# Patient Record
Sex: Female | Born: 1940 | Race: White | Hispanic: No | Marital: Married | Smoking: Never smoker
Health system: Southern US, Community
[De-identification: ages and names within clinical notes are randomized; demographics above are authoritative.]

## PROBLEM LIST (undated history)

## (undated) DIAGNOSIS — I639 Cerebral infarction, unspecified: Secondary | ICD-10-CM

## (undated) DIAGNOSIS — T8859XA Other complications of anesthesia, initial encounter: Secondary | ICD-10-CM

## (undated) DIAGNOSIS — IMO0002 Reserved for concepts with insufficient information to code with codable children: Secondary | ICD-10-CM

## (undated) DIAGNOSIS — I4891 Unspecified atrial fibrillation: Secondary | ICD-10-CM

## (undated) DIAGNOSIS — K5790 Diverticulosis of intestine, part unspecified, without perforation or abscess without bleeding: Secondary | ICD-10-CM

## (undated) DIAGNOSIS — T4145XA Adverse effect of unspecified anesthetic, initial encounter: Secondary | ICD-10-CM

## (undated) DIAGNOSIS — I10 Essential (primary) hypertension: Secondary | ICD-10-CM

## (undated) DIAGNOSIS — K635 Polyp of colon: Secondary | ICD-10-CM

## (undated) HISTORY — PX: TUBAL LIGATION: SHX77

## (undated) HISTORY — PX: EYE SURGERY: SHX253

## (undated) HISTORY — DX: Diverticulosis of intestine, part unspecified, without perforation or abscess without bleeding: K57.90

## (undated) HISTORY — DX: Essential (primary) hypertension: I10

## (undated) HISTORY — DX: Cerebral infarction, unspecified: I63.9

## (undated) HISTORY — DX: Unspecified atrial fibrillation: I48.91

## (undated) HISTORY — DX: Polyp of colon: K63.5

## (undated) HISTORY — DX: Reserved for concepts with insufficient information to code with codable children: IMO0002

## (undated) HISTORY — PX: APPENDECTOMY: SHX54

---

## 1993-03-22 DIAGNOSIS — IMO0002 Reserved for concepts with insufficient information to code with codable children: Secondary | ICD-10-CM | POA: Insufficient documentation

## 1993-03-22 HISTORY — DX: Reserved for concepts with insufficient information to code with codable children: IMO0002

## 1999-06-15 ENCOUNTER — Encounter: Payer: Self-pay | Admitting: Internal Medicine

## 1999-06-15 ENCOUNTER — Ambulatory Visit (HOSPITAL_COMMUNITY): Admission: RE | Admit: 1999-06-15 | Discharge: 1999-06-15 | Payer: Self-pay | Admitting: Internal Medicine

## 1999-06-29 ENCOUNTER — Encounter: Payer: Self-pay | Admitting: Internal Medicine

## 1999-07-14 ENCOUNTER — Ambulatory Visit (HOSPITAL_COMMUNITY): Admission: RE | Admit: 1999-07-14 | Discharge: 1999-07-14 | Payer: Self-pay | Admitting: Internal Medicine

## 1999-07-14 ENCOUNTER — Encounter (INDEPENDENT_AMBULATORY_CARE_PROVIDER_SITE_OTHER): Payer: Self-pay | Admitting: Specialist

## 1999-07-14 ENCOUNTER — Encounter: Payer: Self-pay | Admitting: Internal Medicine

## 2004-01-03 ENCOUNTER — Encounter: Payer: Self-pay | Admitting: Internal Medicine

## 2005-01-07 ENCOUNTER — Ambulatory Visit: Payer: Self-pay | Admitting: Internal Medicine

## 2005-01-12 ENCOUNTER — Ambulatory Visit: Payer: Self-pay | Admitting: Internal Medicine

## 2005-10-26 ENCOUNTER — Observation Stay (HOSPITAL_COMMUNITY): Admission: EM | Admit: 2005-10-26 | Discharge: 2005-10-27 | Payer: Self-pay | Admitting: Emergency Medicine

## 2005-10-26 ENCOUNTER — Encounter: Payer: Self-pay | Admitting: Cardiology

## 2005-10-26 ENCOUNTER — Ambulatory Visit: Payer: Self-pay | Admitting: Internal Medicine

## 2005-11-12 ENCOUNTER — Ambulatory Visit: Payer: Self-pay | Admitting: Internal Medicine

## 2006-11-15 DIAGNOSIS — I10 Essential (primary) hypertension: Secondary | ICD-10-CM

## 2007-01-12 ENCOUNTER — Ambulatory Visit: Payer: Self-pay | Admitting: Internal Medicine

## 2007-01-12 DIAGNOSIS — Z860100 Personal history of colon polyps, unspecified: Secondary | ICD-10-CM | POA: Insufficient documentation

## 2007-01-12 DIAGNOSIS — K5730 Diverticulosis of large intestine without perforation or abscess without bleeding: Secondary | ICD-10-CM | POA: Insufficient documentation

## 2007-02-09 ENCOUNTER — Ambulatory Visit: Payer: Self-pay | Admitting: Internal Medicine

## 2007-02-10 LAB — CONVERTED CEMR LAB
BUN: 7 mg/dL (ref 6–23)
CO2: 31 meq/L (ref 19–32)
Calcium: 10 mg/dL (ref 8.4–10.5)
Chloride: 100 meq/L (ref 96–112)
Creatinine, Ser: 0.8 mg/dL (ref 0.4–1.2)
GFR calc Af Amer: 92 mL/min
GFR calc non Af Amer: 76 mL/min
Glucose, Bld: 83 mg/dL (ref 70–99)
Potassium: 4.6 meq/L (ref 3.5–5.1)
Sodium: 137 meq/L (ref 135–145)

## 2007-04-25 ENCOUNTER — Ambulatory Visit: Payer: Self-pay | Admitting: Internal Medicine

## 2007-04-25 DIAGNOSIS — C4499 Other specified malignant neoplasm of skin, unspecified: Secondary | ICD-10-CM | POA: Insufficient documentation

## 2007-08-10 ENCOUNTER — Ambulatory Visit: Payer: Self-pay | Admitting: Internal Medicine

## 2007-11-07 ENCOUNTER — Ambulatory Visit: Payer: Self-pay | Admitting: Internal Medicine

## 2007-11-28 ENCOUNTER — Ambulatory Visit (HOSPITAL_COMMUNITY): Admission: RE | Admit: 2007-11-28 | Discharge: 2007-11-29 | Payer: Self-pay | Admitting: Ophthalmology

## 2008-02-16 ENCOUNTER — Ambulatory Visit: Payer: Self-pay | Admitting: Family Medicine

## 2008-02-16 DIAGNOSIS — L408 Other psoriasis: Secondary | ICD-10-CM | POA: Insufficient documentation

## 2008-11-13 ENCOUNTER — Ambulatory Visit: Payer: Self-pay | Admitting: Internal Medicine

## 2008-11-13 LAB — CONVERTED CEMR LAB
ALT: 20 units/L (ref 0–35)
AST: 24 units/L (ref 0–37)
Albumin: 4.3 g/dL (ref 3.5–5.2)
Alkaline Phosphatase: 61 units/L (ref 39–117)
BUN: 6 mg/dL (ref 6–23)
Basophils Absolute: 0 10*3/uL (ref 0.0–0.1)
Basophils Relative: 0.3 % (ref 0.0–3.0)
Bilirubin Urine: NEGATIVE
Bilirubin, Direct: 0 mg/dL (ref 0.0–0.3)
CO2: 33 meq/L — ABNORMAL HIGH (ref 19–32)
Calcium: 9.8 mg/dL (ref 8.4–10.5)
Chloride: 98 meq/L (ref 96–112)
Cholesterol: 218 mg/dL — ABNORMAL HIGH (ref 0–200)
Creatinine, Ser: 0.7 mg/dL (ref 0.4–1.2)
Direct LDL: 143.5 mg/dL
Eosinophils Absolute: 0.1 10*3/uL (ref 0.0–0.7)
Eosinophils Relative: 1.3 % (ref 0.0–5.0)
GFR calc non Af Amer: 88.32 mL/min (ref >60)
Glucose, Bld: 102 mg/dL — ABNORMAL HIGH (ref 70–99)
Glucose, Urine, Semiquant: NEGATIVE
HCT: 45.2 % (ref 36.0–46.0)
HDL: 60.9 mg/dL (ref >39.00)
Hemoglobin: 14.8 g/dL (ref 12.0–15.0)
Ketones, urine, test strip: NEGATIVE
Lymphocytes Relative: 34.7 % (ref 12.0–46.0)
Lymphs Abs: 1.9 10*3/uL (ref 0.7–4.0)
MCHC: 32.7 g/dL (ref 30.0–36.0)
MCV: 88.6 fL (ref 78.0–100.0)
Monocytes Absolute: 0.6 10*3/uL (ref 0.1–1.0)
Monocytes Relative: 10 % (ref 3.0–12.0)
Neutro Abs: 2.9 10*3/uL (ref 1.4–7.7)
Neutrophils Relative %: 53.7 % (ref 43.0–77.0)
Nitrite: NEGATIVE
Platelets: 273 10*3/uL (ref 150.0–400.0)
Potassium: 4.3 meq/L (ref 3.5–5.1)
RBC: 5.1 M/uL (ref 3.87–5.11)
RDW: 12.6 % (ref 11.5–14.6)
Sodium: 135 meq/L (ref 135–145)
Specific Gravity, Urine: 1.015
TSH: 3.06 microintl units/mL (ref 0.35–5.50)
Total Bilirubin: 1 mg/dL (ref 0.3–1.2)
Total CHOL/HDL Ratio: 4
Total Protein: 8.2 g/dL (ref 6.0–8.3)
Triglycerides: 79 mg/dL (ref 0.0–149.0)
Urobilinogen, UA: 0.2
VLDL: 15.8 mg/dL (ref 0.0–40.0)
WBC Urine, dipstick: NEGATIVE
WBC: 5.5 10*3/uL (ref 4.5–10.5)
pH: 7.5

## 2008-11-21 ENCOUNTER — Ambulatory Visit: Payer: Self-pay | Admitting: Internal Medicine

## 2008-11-21 DIAGNOSIS — I872 Venous insufficiency (chronic) (peripheral): Secondary | ICD-10-CM | POA: Insufficient documentation

## 2008-12-12 ENCOUNTER — Encounter (INDEPENDENT_AMBULATORY_CARE_PROVIDER_SITE_OTHER): Payer: Self-pay | Admitting: *Deleted

## 2009-04-16 ENCOUNTER — Telehealth: Payer: Self-pay | Admitting: Internal Medicine

## 2009-08-12 ENCOUNTER — Telehealth (INDEPENDENT_AMBULATORY_CARE_PROVIDER_SITE_OTHER): Payer: Self-pay | Admitting: *Deleted

## 2009-12-16 ENCOUNTER — Ambulatory Visit: Payer: Self-pay | Admitting: Internal Medicine

## 2009-12-18 LAB — CONVERTED CEMR LAB
BUN: 10 mg/dL (ref 6–23)
Basophils Absolute: 0 10*3/uL (ref 0.0–0.1)
Basophils Relative: 0.4 % (ref 0.0–3.0)
CO2: 31 meq/L (ref 19–32)
Calcium: 9.3 mg/dL (ref 8.4–10.5)
Chloride: 98 meq/L (ref 96–112)
Creatinine, Ser: 0.7 mg/dL (ref 0.4–1.2)
Eosinophils Absolute: 0 10*3/uL (ref 0.0–0.7)
Eosinophils Relative: 0.8 % (ref 0.0–5.0)
GFR calc non Af Amer: 88.03 mL/min (ref >60)
Glucose, Bld: 86 mg/dL (ref 70–99)
HCT: 42.6 % (ref 36.0–46.0)
Hemoglobin: 14.2 g/dL (ref 12.0–15.0)
Lymphocytes Relative: 38.2 % (ref 12.0–46.0)
Lymphs Abs: 2 10*3/uL (ref 0.7–4.0)
MCHC: 33.4 g/dL (ref 30.0–36.0)
MCV: 88.3 fL (ref 78.0–100.0)
Monocytes Absolute: 0.5 10*3/uL (ref 0.1–1.0)
Monocytes Relative: 10.5 % (ref 3.0–12.0)
Neutro Abs: 2.6 10*3/uL (ref 1.4–7.7)
Neutrophils Relative %: 50.1 % (ref 43.0–77.0)
Platelets: 262 10*3/uL (ref 150.0–400.0)
Potassium: 3.9 meq/L (ref 3.5–5.1)
RBC: 4.83 M/uL (ref 3.87–5.11)
RDW: 13.4 % (ref 11.5–14.6)
Sodium: 136 meq/L (ref 135–145)
TSH: 1.82 microintl units/mL (ref 0.35–5.50)
WBC: 5.2 10*3/uL (ref 4.5–10.5)

## 2010-04-21 NOTE — Progress Notes (Signed)
Summary: Schedule recall colonoscopy  Phone Note Outgoing Call Call back at Home Phone [phone]   Call placed by: Christie Nottingham CMA Duncan Dull),  Aug 12, 2009 11:51 AM Call placed to: Patient Summary of Call: Called pt to schedule recall colonoscopy and pt states her husband lost his job last year and they are not scheduling any expensive tests at this time and she states she will be in touch to schedule this procedure when she gets the money. I told her the importance of scheduling and financial options. Pt states she will call back to schedule.  Initial call taken by: Christie Nottingham CMA Duncan Dull),  Aug 12, 2009 11:53 AM

## 2010-04-21 NOTE — Assessment & Plan Note (Signed)
Summary: fu on meds/njr   Vital Signs:  Patient profile:   70 year old female Menstrual status:  postmenopausal Weight:      137 pounds BMI:     22.19 Temp:     98.6 degrees F oral Pulse rate:   78 / minute Pulse rhythm:   regular Resp:     12 per minute BP sitting:   154 / 76  Vitals Entered By: Lynann Beaver CMA (December 16, 2009 10:15 AM) CC: rov Is Patient Diabetic? No Pain Assessment Patient in pain? no        CC:  rov.  History of Present Illness:  Follow-Up Visit      This is a 70 year old woman who presents for Follow-up visit.  The patient denies chest pain and palpitations.  Since the last visit the patient notes no new problems or concerns.  The patient reports taking meds as prescribed.  When questioned about possible medication side effects, the patient notes none.    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 Qd 2)  Benazepril-Hydrochlorothiazide 20-12.5 Mg Tabs (Benazepril-Hydrochlorothiazide) .... Take 1 Tablet By Mouth Two Times A Day--Needs Office Visit For Additional Refills  Allergies (verified): 1)  ! Sulfa 2)  ! Dilantin 3)  ! Aspirin (Aspirin)  Past History:  Past Medical History: Last updated: 08/10/2007 Atrial fibrillation Hypertension Cerebrovascular accident, hx of-hemorrhagic stroke. 1996 Colonic polyps, hx of Diverticulosis, colon  Past Surgical History: Last updated: 11/15/2006 Colonoscopy-01/03/2004 Appendectomy BTL  Family History: Last  updated: 11/15/2006 Father: MI, CABG,Skin CA Mother: Stroke,HTN Siblings: CAD, HTN ?  Social History: Last updated: 11/15/2006 Never Smoked Alcohol use-no Marital Status: Married Children: 7 Occupation:   Risk Factors: Smoking Status: never (11/15/2006)  Physical Exam  General:  alert and well-developed.   Head:  normocephalic and atraumatic.   Eyes:  pupils equal and pupils round.   Ears:  R ear normal and L ear normal.   Neck:  No deformities, masses, or tenderness noted. Lungs:  normal respiratory effort and no intercostal retractions.   Heart:  normal rate and regular rhythm.   Abdomen:  soft and non-tender.   Neurologic:  cranial nerves II-XII intact and gait normal.   Skin:  turgor normal and color normal.     Impression & Recommendations:  Problem # 1:  HYPERTENSION (ICD-401.9)  she admit to taking only 1 pill  by mouth once daily advised to increase dose---she understands Her updated medication list for this problem includes:    Lisinopril-hydrochlorothiazide 20-25 Mg Tabs (Lisinopril-hydrochlorothiazide) .Marland Kitchen... Take 1 tab by mouth daily  BP today: 154/76 Prior BP: 142/78 (11/21/2008)  Labs Reviewed: K+: 4.3 (11/13/2008) Creat: : 0.7 (11/13/2008)   Chol: 218 (11/13/2008)   HDL: 60.90 (11/13/2008)   TG: 79.0 (11/13/2008)  Orders: Venipuncture (47829) Specimen Handling (56213) TLB-BMP (Basic Metabolic Panel-BMET) (80048-METABOL)  Problem # 2:  ATRIAL FIBRILLATION-TRANSIENT (ICD-427.31)  no recurrence  Orders: Specimen Handling (08657) TLB-TSH (Thyroid Stimulating Hormone) (84443-TSH) TLB-CBC Platelet - w/Differential (85025-CBCD)  Problem # 3:  STROKE (ICD-434.91) no recurrence  Complete Medication List: 1)  Multivitamins Tabs (Multiple vitamin) .... Qd 2)  Lisinopril-hydrochlorothiazide 20-25 Mg Tabs (Lisinopril-hydrochlorothiazide) .... Take 1 tab by mouth daily Prescriptions: LISINOPRIL-HYDROCHLOROTHIAZIDE 20-25 MG  TABS  (LISINOPRIL-HYDROCHLOROTHIAZIDE) Take 1 tab by mouth daily  #90 x 3   Entered and Authorized by:   Birdie Sons MD   Signed by:   Birdie Sons MD on 12/16/2009   Method used:   Electronically to        [facility]  [phone]* (retail)       [address]       [place], [place]  [address]       Ph: [phone] or [phone]       Fax: [phone]   RxID:   [id number]

## 2010-04-21 NOTE — Progress Notes (Signed)
Summary: discuss meds  Phone Note Call from Patient Call back at Home Phone [phone]   Caller: Patient, vm Call For: Birdie Sons MD Summary of Call: husband lost job at Christmas and now no insurance.  Want to discuss meds for cheapest. Initial call taken by: Gladis Riffle, RN,  April 16, 2009 3:16 PM  Follow-up for Phone Call        Pt has 2 week supply left of benazepril-hct 20-12.5 two times a day .  [facility] has benazepril 20mg  #90 for $10 so  two times a day would be $20.---and then hctz 12.5 mg at [facility] is #90 for $10 so two times a day would be $20  other alternative would be benazepril 40mg  at #90 for $10 and do 1/2 two times a day plus hctz 25mg  for #90 at $10 and do 1/2 two times a day.  Advise.                      [facility] Follow-up by: Gladis Riffle, RN,  April 16, 2009 3:38 PM  Additional Follow-up for Phone Call Additional follow up Details #1::        as long as the dose is the same any combination is ok Additional Follow-up by: Birdie Sons MD,  April 16, 2009 7:15 PM    Additional Follow-up for Phone Call Additional follow up Details #2::    Rx will be written for pt to take where she wants.  She can call pharmacies to get cheapest.Left message on machine to pick up Rx.  see Rx Follow-up by: Gladis Riffle, RN,  April 18, 2009 2:19 PM  New/Updated Medications: BENAZEPRIL-HYDROCHLOROTHIAZIDE 20-12.5 MG TABS (BENAZEPRIL-HYDROCHLOROTHIAZIDE) Take 1 tablet by mouth two times a day Prescriptions: BENAZEPRIL-HYDROCHLOROTHIAZIDE 20-12.5 MG TABS (BENAZEPRIL-HYDROCHLOROTHIAZIDE) Take 1 tablet by mouth two times a day  #30 x 3   Entered by:   Gladis Riffle, RN   Authorized by:   Birdie Sons MD   Signed by:   Gladis Riffle, RN on 04/18/2009   Method used:   Print then Give to Patient   RxID:   [id number]

## 2010-08-04 NOTE — Op Note (Signed)
[name], Kayla Wilson                ACCOUNT NO.:  [id number]   MEDICAL RECORD NO.:  [id number]          PATIENT TYPE:  OIB   LOCATION:  5118                         FACILITY:  [facility]   PHYSICIAN:  Beulah Gandy. Ashley Royalty, M.D. DATE OF BIRTH:  09/22/1940   DATE OF PROCEDURE:  11/28/2007  DATE OF DISCHARGE:                               OPERATIVE REPORT   ADMISSION DIAGNOSIS:  Macular hole, left eye.   PROCEDURES:  1. Pars plana vitrectomy, left eye.  2. Retinal photocoagulation, left eye.  3. Membrane peel, left eye.  4. Serum patch, left eye.  5. Gas-fluid exchange, left eye.  6. Perfluoropropane injection, left eye.   SURGEON:  Beulah Gandy. Ashley Royalty, MD   ASSISTANT:  Rosalie Doctor, MA   ANESTHESIA:  General.   DETAILS:  Usual prep and drape.  Peritomies at 10, 2, and 4 o'clock with  infusion at 4 o'clock.  Contact lens ring anchored into place at 6 and  12 o'clock.  Provisc placed on the corneal surface, and the flat contact  lens was placed.  Pars plana vitrectomy was begun just behind the  cataractous lens.  Strands and membranes were encountered.  The  vitrectomy was carried down to the macular surface where the internal  limiting membrane was taut and glistening.  This preretinal fibrosis was  seen on top of the membrane.  The membrane was peeled with the lighted  pick, the MVR blade, and the diamond dusted membrane scraper.  It was  peeled out to the equator, and the cutter was used to remove the ILM  remnants.  The edge of the hole was treated.  At this point, the  indirect ophthalmoscope laser was moved into place, 749 burns were  placed around the periphery in 2 rows, the power was 400 mV, 1000  microns each, and 0.1 seconds each.  A gas-fluid exchange was performed  after a total peripheral vitrectomy down to the vitreous base to the  visualization of the 30-degree prismatic lens.  The total gas fluid  exchange was performed, and sufficient time was allowed for additional  fluid  to track down the walls of the eye and collect in the posterior  segment.  A serum patch was prepared during this time, and the  Perfluoropropane was mixed to a 16% concentration.  The additional fluid  was removed with [place] ophthalmics brush.  Serum patch was delivered.  Intravitreal gas was exchanged for Perfluoropropane 16%.  The  instruments were removed from the eye, and a 9-0 nylon was used to close  the sclerotomy sites.  They were tested and found to be tight.  The  conjunctiva was closed with wet-field cautery.  Polymyxin and gentamicin  were irrigated into [place] space.  Atropine solution was applied.  Marcaine was then injected around the globe for postop pain.  Decadron  10 mg was injected for lower  subconjunctival space.  Additional gas fill raised the pressure to 10  with a [place] tonometer.  Polysporin ophthalmic ointment and patch  and shield were placed.  The patient was awakened and taken to recovery  in satisfactory condition.      Beulah Gandy. Ashley Royalty, M.D.  Electronically Signed     JDM/MEDQ  D:  11/28/2007  T:  11/29/2007  Job:  [id number]

## 2010-08-07 NOTE — H&P (Signed)
Kayla Wilson, Kayla Wilson                ACCOUNT NO.:  [id number]   MEDICAL RECORD NO.:  [id number]          PATIENT TYPE:  EMS   LOCATION:  MAJO                         FACILITY:  [facility]   PHYSICIAN:  Joellyn Rued, P.A. [facility] DATE OF BIRTH:  05-21-1940   DATE OF ADMISSION:  10/26/2005  DATE OF DISCHARGE:                                HISTORY & PHYSICAL   BRIEF HISTORY:  Ms. Hieronymus a is a 70 year old white female who presented to  [facility] emergency room with a syncopal episode.  She stated this past  Friday she first noticed a sore throat followed by chills sinus congestion  and drainage.  Saturday she became very nauseated with a decreased appetite.  This persisted throughout the weekend and on Monday she felt slightly better  and took four Coricidin.  She never checked her temperature throughout the  week. This morning she was unable to urinate and she felt weak.  While  fixing breakfast for her husband and her son, she felt very lightheaded and  dizzy and felt that she needed to sit on the floor; however, her husband  witnessed loss of consciousness of less than 1 minute.  She did not sustain  any injuries when she fell to the floor.  Post her episode she stated that  she was weak in her husband drove her the emergency room for further  evaluation.  She taken one Tylenol this morning.  She denies prior  occurrences.  She denies any over-the-counter medications such as Sudafed,  excessive chocolate or caffeine.   ALLERGIES:  Include SULFA and DILANTIN.   MEDICATIONS:  Include Lotensin, HCT 20/12.5 q.d. She has not had this today.   Her past medical history is notable for hypertension, a CVA approximately 10  years ago.  She stated that she had a seizure at presentation secondary to a  burst blood vessel.  Specifics unavailable she was told never to take any  blood thinners including aspirin.   Surgical history is notable for appendectomy at age 58, BTL.  She denies any  history of  diabetes, myocardial infarction, COPD, bleeding dyscrasias,  thyroid dysfunction.  She does not know her specific cholesterol.  She  resides in [place] with her husband and her son.  She has total seven  children, 11 grandchildren and two great-grandchildren.  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  She  states she has never smoked, used alcohol, drugs or herbal medication.  She  maintains a low salt diet.  She does not exercise on a regular basis but is  very active.   FAMILY HISTORY:  Her mother died at the age of 52 secondary to CVA, unknown  history of heart problems.  Her father died age 4 secondary to a broken  heart he also had some type of blood tumors and heart problems.  She has  three brothers all have diabetes.  One sister who is alive and well.   REVIEW OF SYSTEMS:  In addition to above is notable for chills, sweats,  headache since Saturday,  sinus drainage, glasses upper and lower dentures.  Multiple moles.  Green productive cough.  Palpitations since last night.  Postmenopausal.  Straining for urination this morning.  Generalized  weakness.  Arthralgias, myalgias all over throughout the weekend and nausea.  All other systems are negative.   PHYSICAL EXAM:  GENERAL:  Well-nourished, well-developed pleasant white  female in no apparent distress on presentation to the emergency room. VITAL  SIGNS:  Temperature was 99.  Initial blood pressure by me was 82/57 and is  now 107/59 after increase of IV fluids.  Pulse was 156 but ranges from 100  to 150s.  Respirations are 18, 97% sat on room air.  HEENT: Is unremarkable except for dentures.  NECK: Supple without thyromegaly, adenopathy, JVD or carotid bruits.  JVP is  approximately 7 __________  she does have right orbital sinus tenderness.  LUNGS:  Symmetrical excursion and distant breath sounds.  HEART:  PMI is not displaced.  Rapid irregular irregular rate and rhythm.  Do  not appreciate any murmurs, rubs, clicks or gallops.  SKIN:  Integument  appears to be intact.  ABDOMEN: Soft.  Bowel sounds present without organomegaly, masses or  tenderness.  EXTREMITIES: No cyanosis, clubbing or edema.  MUSCULOSKELETAL/NEURO:  Grossly unremarkable.   Chest x-ray showed no active disease, COPD findings, tortuous aorta.  EKG  shows atrial fibrillation with a rapid ventricular rate of 117, nonspecific  ST-T wave changes.  No old EKGs for comparison.  Labs are pending.   DISPOSITION:  Upper respiratory/sinus infection, probable dehydration  resulting in hypotension and syncope, atrial fibrillation with a rapid  ventricular rate. More history per past medical history.   DISPOSITION:  Dr. Kittie Plater spoke with the patient and her family, examined  the patient and determined plan of care.  We will admit her for further but  evaluation.  Will begin her on nasal oxygen, nasal steroids for her sinus  infection, IV fluids for her dehydration hypotension. We will continue her  IV Cardizem and add digoxin for her atrial fibrillation.  It is noted that  the patient refuses aspirin, Coumadin and any anticoagulation at this time  given her history of CVA and burst blood vessel.  We will also obtain 2-D  echocardiogram, TSH and our usual labs.  Hopefully as her dehydration and  sinus infection improve, she will convert to sinus rhythm and will not need  long-term anticoagulation.      Joellyn Rued, P.A. [facility]     EW/MEDQ  D:  10/26/2005  T:  10/26/2005  Job:  [id number]   cc:   Valetta Mole. Swords, M.D. [facility]  Jomarie Longs D. Venetia Maxon, M.D.

## 2010-08-07 NOTE — Discharge Summary (Signed)
NAMECHEVI, LIM                ACCOUNT NO.:  [id number]   MEDICAL RECORD NO.:  [id number]          PATIENT TYPE:  INP   LOCATION:  4742                         FACILITY:  [facility]   PHYSICIAN:  Flora Bing, M.D. LHCDATE OF BIRTH:  June 01, 1940   DATE OF ADMISSION:  10/26/2005  DATE OF DISCHARGE:  10/27/2005                           DISCHARGE SUMMARY - REFERRING   PROCEDURES PERFORMED:  None.   DISCHARGE DIAGNOSES:  1.  Syncope.  2.  Atrial fibrillation with rapid ventricular rate, less than 24 hours and      conversion to normal sinus rhythm, probably associated with upper      respiratory infection.  3.  Upper respiratory infection/sinus infection, urinary tract infection.      History is noted below.   BRIEF HISTORY:  Ms. Khurana is a 70 year old white female who presented to [facility] Emergency Room with a syncopal episode.  This was preceded with a four  or five-day history of chills, sinus congestion with drainage, nausea,  decreased appetite, decreased urinary output, and weakness.  On the morning  of admission, she felt very light-headed and dizzy and was unable to  urinate.  According to the patient, her husband had stated that she lost  consciousness for approximately 1 minute without injuries.  On arrival to  the emergency room, she was in atrial fibrillation with a rapid ventricular  rate and febrile.  Her history is notable for hypertension, a CVA  approximately 10 years ago associated with a seizure secondary to a burst  blood vessel.  Specifics are unavailable; however, the patient refuses to  take any anticoagulation medications, including aspirin.   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  T-max  during admission was 102.8.  Admission weight was 141.5.  O2 sats were  between 95% and 99% on room air.  H&H was 15.6 and 46, normal indices.  Plateltets 267.  WBC  13.1.  PTT 28.  PT 15.7.  Sodium 131, potassium 3.5,  BUN 4, creatinine 0.7, normal LFTs.  Hemoglobin A1c 5.6.  CK-MBs and  troponins were within normal limits x 3.  Total cholesterol 133.  Triglycerides 42.  HDL 41, LDL 84.  TSH 2.933.  Free T4 0.89.  EKG on  admission showed atrial fibrillation with a rapid ventricular rate, no acute  changes.  On the 8th, it showed normal sinus rhythm, normal axis, early R-  wave, nonspecific ST-T wave changes.  Echocardiogram obtained on the 7th,  showed an EF of 55% to 60%, inadequate for wall motion abnormalities.  There  was grade 1 diastolic dysfunction, mild aortic valve calcification, and mild  AI, mild MR.   HOSPITAL COURSE:  Ms. Pilkenton was initially placed on IV Cardizem and digoxin  for rate control.  However, after initially beginning these medications, she  converted to normal sinus rhythm, sinus bradycardia.  The Cardizem and  digoxin were discontinued.  She was placed on IV antibiotics and IV fluids  and admitted to the hospital for further treatment of her  infection.  Overnight, she remained in sinus rhythm.  After receiving fluid and being  placed on antibiotics, Ms. Blackwell, on the morning of October 27, 2005, stated  that she felt much, much better.  Urinalysis was also abnormal in regard to  bacteria, leukocytes, ketones, protein and blood.  [place], she denied  dysuria.  Urine culture was obtained and is pending at the time of this  dictation.  After review by Dr. Dietrich Pates, it was felt that the patient could  be discharged home with further outpatient treatment and followup with her  primary care physician.   DISPOSITION:  Ms. Formisano is discharged home.  She is asked to maintain low-  salt, low, and cholesterol diet.  She is asked to rest and avoid heavy  exertion for approximately one week.  Wound care is not applicable.  She was  asked to begin a blood pressure diary and to bring all medications and her  blood pressure diary to follow-up  appointments with Dr. Cato Mulligan.  She was  asked to call him for a follow-up appointment.  She received a new  prescription for Levaquin 500 mg daily for 10 days, Nasonex 500 mcg two  sprays each naris daily, and Toprol XL 25 mg p.o. daily.  She was asked not  to take her Lotensin/HCT.   Ms. Weich, at the time of discharge, states that she will think about not  taking her Lotensin/HCT versus beginning a new medication for her blood  pressure.  She states that she will consider this.  She is very reluctant to  adjust any of her medications.      Joellyn Rued, P.A. [facility]      [place] Bing, M.D. [facility]  Electronically Signed    EW/MEDQ  D:  10/27/2005  T:  10/27/2005  Job:  [id number]   cc:   Valetta Mole. Swords, M.D. [facility]

## 2010-11-25 ENCOUNTER — Other Ambulatory Visit: Payer: Self-pay | Admitting: Internal Medicine

## 2010-12-23 LAB — BASIC METABOLIC PANEL
CO2: 30
Chloride: 91 — ABNORMAL LOW
Creatinine, Ser: 0.66
GFR calc Af Amer: >60
Glucose, Bld: 105 — ABNORMAL HIGH

## 2010-12-23 LAB — CBC
Hemoglobin: 14.8
MCHC: 33.8
MCV: 87
RBC: 5.04
RDW: 12.6

## 2011-06-14 ENCOUNTER — Other Ambulatory Visit: Payer: Self-pay | Admitting: Internal Medicine

## 2011-06-16 ENCOUNTER — Other Ambulatory Visit: Payer: Self-pay | Admitting: Internal Medicine

## 2011-09-28 ENCOUNTER — Other Ambulatory Visit: Payer: Self-pay | Admitting: Internal Medicine

## 2011-10-04 ENCOUNTER — Other Ambulatory Visit: Payer: Self-pay | Admitting: Internal Medicine

## 2011-10-12 ENCOUNTER — Telehealth: Payer: Self-pay | Admitting: Internal Medicine

## 2011-10-12 DIAGNOSIS — I10 Essential (primary) hypertension: Secondary | ICD-10-CM

## 2011-10-12 MED ORDER — LISINOPRIL-HYDROCHLOROTHIAZIDE 20-25 MG PO TABS: 1.0000 | ORAL_TABLET | Freq: Every day | ORAL | Status: DC

## 2011-10-12 NOTE — Telephone Encounter (Signed)
rx sent in electronically 

## 2011-10-12 NOTE — Telephone Encounter (Signed)
Pt is coming in for ov on 11/01/11 at 9am and is going to need refill of lisinopril-hydrochlorothiazide (PRINZIDE,ZESTORETIC) 20-25 MG per tablet to last until appt date. Pls call in to [facility] in [place]. Pt said that Dr Cato Mulligan has approved refill.

## 2011-11-01 ENCOUNTER — Encounter: Payer: Self-pay | Admitting: Internal Medicine

## 2011-11-01 ENCOUNTER — Ambulatory Visit (INDEPENDENT_AMBULATORY_CARE_PROVIDER_SITE_OTHER): Payer: Medicare Other | Admitting: Internal Medicine

## 2011-11-01 VITALS — BP 128/76 | HR 68 | Temp 98.0°F | Ht 68.0 in | Wt 137.0 lb

## 2011-11-01 DIAGNOSIS — I10 Essential (primary) hypertension: Secondary | ICD-10-CM

## 2011-11-01 DIAGNOSIS — IMO0002 Reserved for concepts with insufficient information to code with codable children: Secondary | ICD-10-CM

## 2011-11-01 LAB — BASIC METABOLIC PANEL
BUN: 9 mg/dL (ref 6–23)
CO2: 32 mEq/L (ref 19–32)
Chloride: 92 mEq/L — ABNORMAL LOW (ref 96–112)
Creatinine, Ser: 0.8 mg/dL (ref 0.4–1.2)
Glucose, Bld: 98 mg/dL (ref 70–99)

## 2011-11-01 LAB — LIPID PANEL
Cholesterol: 197 mg/dL (ref 0–200)
LDL Cholesterol: 117 mg/dL — ABNORMAL HIGH (ref 0–99)
Total CHOL/HDL Ratio: 3

## 2011-11-01 MED ORDER — LISINOPRIL-HYDROCHLOROTHIAZIDE 20-25 MG PO TABS: 1.0000 | ORAL_TABLET | Freq: Every day | ORAL | Status: DC

## 2011-11-01 NOTE — Progress Notes (Signed)
Patient ID: Kayla Wilson, female   DOB: May 03, 1940, 71 y.o.   MRN: [id number] htn-- tolerating meds. Home BPs 130s/85  Hx of stroke- no recurrence   Past Medical History  Diagnosis Date  . Atrial fibrillation   . Hypertension   . Stroke   . Colon polyps   . Diverticulosis   . IATROGENIC CEREBROVASCULAR INFARCT/HEMORRHAGE NE 03/22/1993    Qualifier: Diagnosis of  By: Cato Mulligan MD, Maalik Pinn  1996 per pt's report-Dr Venetia Maxon- hemorrhagic stroke. No intervention required      History   Social History  . Marital Status: Married    Spouse Name: N/A    Number of Children: N/A  . Years of Education: N/A   Occupational History  . Not on file.   Social History Main Topics  . Smoking status: Never Smoker   . Smokeless tobacco: Not on file  . Alcohol Use: No  . Drug Use: No  . Sexually Active: Not on file   Other Topics Concern  . Not on file   Social History Narrative  . No narrative on file    Past Surgical History  Procedure Date  . Appendectomy   . Tubal ligation     Family History  Problem Relation Age of Onset  . Stroke Mother   . Hypertension Mother   . Heart disease Father     CABG, MI  . Cancer Father     skin  . Diabetes Brother   . Heart disease Brother   . Hypertension Brother     Allergies  Allergen Reactions  . Aspirin     REACTION: hx of hemorrhagic stroke  . Phenytoin   . Sulfonamide Derivatives     Current Outpatient Prescriptions on File Prior to Visit  Medication Sig Dispense Refill  . diphenhydrAMINE (BENADRYL) 12.5 MG/5ML liquid Take 12.5 mg by mouth 4 (four) times daily as needed.      Marland Kitchen lisinopril-hydrochlorothiazide (PRINZIDE,ZESTORETIC) 20-25 MG per tablet Take 1 tablet by mouth daily.  90 tablet  3     patient denies chest pain, shortness of breath, orthopnea. Denies lower extremity edema, abdominal pain, change in appetite, change in bowel movements. Patient denies rashes, musculoskeletal complaints. No other specific complaints in a  complete review of systems.   BP 128/76  Pulse 68  Temp 98 F (36.7 C) (Oral)  Ht 5\' 8"  (1.727 m)  Wt 137 lb (62.143 kg)  BMI 20.83 kg/m2  Well-developed well-nourished female in no acute distress. HEENT exam atraumatic, normocephalic, extraocular muscles are intact. Neck is supple. No jugular venous distention no thyromegaly. Chest clear to auscultation without increased work of breathing. Cardiac exam S1 and S2 are regular. Abdominal exam active bowel sounds, soft, nontender. Extremities no edema. Neurologic exam she is alert without any motor sensory deficits. Gait is normal.

## 2011-11-01 NOTE — Assessment & Plan Note (Signed)
Home BPs are adequately controlled Continue same meds 

## 2011-11-01 NOTE — Assessment & Plan Note (Signed)
No recurrence. 

## 2012-04-21 ENCOUNTER — Ambulatory Visit (INDEPENDENT_AMBULATORY_CARE_PROVIDER_SITE_OTHER): Payer: Medicare Other | Admitting: Family Medicine

## 2012-04-21 ENCOUNTER — Telehealth: Payer: Self-pay | Admitting: Internal Medicine

## 2012-04-21 ENCOUNTER — Encounter: Payer: Self-pay | Admitting: Family Medicine

## 2012-04-21 VITALS — BP 144/110 | HR 125 | Temp 98.3°F | Wt 134.0 lb

## 2012-04-21 DIAGNOSIS — R3915 Urgency of urination: Secondary | ICD-10-CM

## 2012-04-21 DIAGNOSIS — I10 Essential (primary) hypertension: Secondary | ICD-10-CM

## 2012-04-21 DIAGNOSIS — N390 Urinary tract infection, site not specified: Secondary | ICD-10-CM

## 2012-04-21 LAB — POCT URINALYSIS DIPSTICK
Glucose, UA: NEGATIVE
Nitrite, UA: NEGATIVE
Urobilinogen, UA: 0.2

## 2012-04-21 MED ORDER — CIPROFLOXACIN HCL 250 MG PO TABS: 250.0000 mg | ORAL_TABLET | Freq: Two times a day (BID) | ORAL | Status: DC

## 2012-04-21 NOTE — Telephone Encounter (Signed)
Pt is already here.

## 2012-04-21 NOTE — Progress Notes (Signed)
Chief Complaint  Patient presents with  . Urinary Urgency    and loss of control; burning     HPI:  Acute visit for ? UTI: -started: 1 week ago off and on - cranberry juice and azo helps - got better, but worse on Tuesday - didn't drink much today - no reason why -symptoms: urinary urgency, frequency, burning with urination and saw some blood in urine a few days ago -denies: fevers, chills, abd or pelic or flank pain, NV -hx of: UTI in the remote past -allergic to sulfu abx  HTN: -up today initially -takes her BP daily -reports always up when comes to see doctor - but checks at home and fine -denies: CP, SOB, palpitations, HA  ROS: See pertinent positives and negatives per HPI.  Past Medical History  Diagnosis Date  . Atrial fibrillation   . Hypertension   . Stroke   . Colon polyps   . Diverticulosis   . 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 No intervention required      Family History  Problem Relation Age of Onset  . Stroke Mother   . Hypertension Mother   . Heart disease Father     CABG, MI  . Cancer Father     skin  . Diabetes Brother   . Heart disease Brother   . Hypertension Brother     History   Social History  . Marital Status: Married    Spouse Name: N/A    Number of Children: N/A  . Years of Education: N/A   Social History Main Topics  . Smoking status: Never Smoker   . Smokeless tobacco: None  . Alcohol Use: No  . Drug Use: No  . Sexually Active: None   Other Topics Concern  . None   Social History Narrative  . None    Current outpatient prescriptions:diphenhydrAMINE (BENADRYL) 12.5 MG/5ML liquid, Take 12.5 mg by mouth 4 (four) times daily as needed., Disp: , Rfl: ;  lisinopril-hydrochlorothiazide (PRINZIDE,ZESTORETIC) 20-25 MG per tablet, Take 1 tablet by mouth daily., Disp: 90 tablet, Rfl: 3;  ciprofloxacin (CIPRO)  250 MG tablet, Take 1 tablet (250 mg total) by mouth 2 (two) times daily., Disp: 10 tablet, Rfl: 0  EXAM:  Filed Vitals:   04/21/12 1405  BP: 144/110  Pulse: 125  Temp: 98.3 F (36.8 C)    There is no height on file to calculate BMI.  GENERAL: vitals reviewed and listed above, alert, oriented, appears well hydrated and in no acute distress  HEENT: atraumatic, conjunttiva clear, no obvious abnormalities on inspection of external nose and ears  NECK: no obvious masses on inspection  LUNGS: clear to auscultation bilaterally, no wheezes, rales or rhonchi, good air movement  CV: HRRR, no peripheral edema  ABD: soft, NTTP, no CVA TTP  MS: moves all extremities without noticeable abnormality  PSYCH: pleasant and cooperative, no obvious depression or anxiety  ASSESSMENT AND PLAN:  Discussed the following assessment and plan:  1. Urinary urgency  POCT urinalysis dipstick  2. HYPERTENSION    3. UTI (lower urinary tract infection)  ciprofloxacin (CIPRO) 250 MG tablet   -urine dip and hx c/w UTI - will tx with abx/risks discussed. Culture pending - if cx neg will need follow up to repeat UA and ensure no blood. -BP 130/82 on recheck -advised to follow up with PCP for routine follow up or sooner  if any concerns -Patient advised to return or notify a doctor immediately if symptoms worsen or persist or new concerns arise.  Patient Instructions  --As we discussed, we have prescribed a new medication (CIPROFLOXACIN) for you at this appointment. We discussed the common and serious potential adverse effects of this medication and you can review these and more with the pharmacist when you pick up your medication.  Please follow the instructions for use carefully and notify us immediately if you have any problems taking this medication.  -drink lots of fluids - cranberry and blueberry juice can help  -follow up if worsening or any symptoms return or persist after treatment or any  concerns      KIM, HANNAH R.

## 2012-04-21 NOTE — Patient Instructions (Addendum)
--  As we discussed, we have prescribed a new medication (CIPROFLOXACIN) for you at this appointment. We discussed the common and serious potential adverse effects of this medication and you can review these and more with the pharmacist when you pick up your medication.  Please follow the instructions for use carefully and notify us immediately if you have any problems taking this medication.  -drink lots of fluids - cranberry and blueberry juice can help  -follow up if worsening or any symptoms return or persist after treatment or any concerns

## 2012-04-21 NOTE — Telephone Encounter (Signed)
Pt thinks she may have yeast onf - or bladder? Burns w/urination. Area starting to burn/itch. She wanted to know if Dr. Cato Mulligan could call her in something. As he is not here, I made her an appt at 2pm w/Dr Selena Batten. However, she'd like you to call her to discuss symptoms. She's wondering if something OTC would be ok. I advised that after talking w/you, she could cancel the appt if need be.

## 2012-04-23 LAB — URINE CULTURE: Colony Count: 8000

## 2012-04-24 ENCOUNTER — Telehealth: Payer: Self-pay | Admitting: Family Medicine

## 2012-04-24 NOTE — Telephone Encounter (Signed)
Culture did not show and infection. She needs to follow up with her doctor in a month or so and recheck urine to ensure blood gone and may need to see a urologist if has persistent symptoms or blood.

## 2012-04-25 NOTE — Telephone Encounter (Signed)
Left message on voicemail.

## 2012-04-26 NOTE — Telephone Encounter (Signed)
Called and spoke with pt and pt is aware.  Pt states the symptoms are gone and she feels much better.  Advised pt to give office a call back if symptoms worsen or come back.

## 2013-01-16 ENCOUNTER — Other Ambulatory Visit: Payer: Self-pay | Admitting: Internal Medicine

## 2013-06-20 ENCOUNTER — Other Ambulatory Visit: Payer: Self-pay | Admitting: Internal Medicine

## 2013-06-23 ENCOUNTER — Other Ambulatory Visit: Payer: Self-pay | Admitting: Internal Medicine

## 2013-06-26 ENCOUNTER — Other Ambulatory Visit: Payer: Self-pay | Admitting: Internal Medicine

## 2013-06-28 ENCOUNTER — Other Ambulatory Visit: Payer: Self-pay | Admitting: Internal Medicine

## 2013-07-02 ENCOUNTER — Telehealth: Payer: Self-pay | Admitting: Internal Medicine

## 2013-07-02 MED ORDER — LISINOPRIL-HYDROCHLOROTHIAZIDE 20-25 MG PO TABS: ORAL_TABLET | ORAL | Status: DC

## 2013-07-02 NOTE — Telephone Encounter (Signed)
Pt is needing to know if she can get a partial refill until her appointment on Friday with Padonda, pt states she has been out of meds for 1 week, needs rx lisinopril-hydrochlorothiazide (PRINZIDE,ZESTORETIC) 20-25 MG per tablet send to [facility] .

## 2013-07-02 NOTE — Telephone Encounter (Signed)
rx sent in electroncially 

## 2013-07-06 ENCOUNTER — Ambulatory Visit (INDEPENDENT_AMBULATORY_CARE_PROVIDER_SITE_OTHER): Payer: Medicare Other | Admitting: Family

## 2013-07-06 ENCOUNTER — Encounter: Payer: Self-pay | Admitting: Family

## 2013-07-06 VITALS — HR 111 | Temp 98.2°F | Ht 68.0 in | Wt 134.0 lb

## 2013-07-06 DIAGNOSIS — I10 Essential (primary) hypertension: Secondary | ICD-10-CM

## 2013-07-06 LAB — COMPREHENSIVE METABOLIC PANEL
ALBUMIN: 3.9 g/dL (ref 3.5–5.2)
ALK PHOS: 62 U/L (ref 39–117)
ALT: 18 U/L (ref 0–35)
AST: 25 U/L (ref 0–37)
BUN: 9 mg/dL (ref 6–23)
CALCIUM: 9.7 mg/dL (ref 8.4–10.5)
CHLORIDE: 93 meq/L — AB (ref 96–112)
CO2: 30 meq/L (ref 19–32)
Creatinine, Ser: 0.7 mg/dL (ref 0.4–1.2)
GFR: 88.61 mL/min (ref >60.00)
GLUCOSE: 94 mg/dL (ref 70–99)
POTASSIUM: 3.5 meq/L (ref 3.5–5.1)
SODIUM: 132 meq/L — AB (ref 135–145)
TOTAL PROTEIN: 8 g/dL (ref 6.0–8.3)
Total Bilirubin: 0.7 mg/dL (ref 0.3–1.2)

## 2013-07-06 NOTE — Progress Notes (Signed)
Subjective:    Patient ID: Kayla Wilson, female    DOB: 11-27-1940, 73 y.o.   MRN: [id number]  HPI 73 year old caucasian female, nonsmoker presenting for recheck of hypertension. She has been out of her medication for two and half weeks.Tries to follow a healthy diet. Does exercise.    Review of Systems  Constitutional: Negative.   Eyes: Negative for photophobia and visual disturbance.  Respiratory: Negative.   Cardiovascular: Negative.  Negative for leg swelling.  Gastrointestinal: Negative.   Genitourinary: Negative.   Musculoskeletal: Negative.   Skin: Negative.   Neurological: Negative.  Negative for dizziness, light-headedness and headaches.   Past Medical History  Diagnosis Date  . Atrial fibrillation   . Hypertension   . Stroke   . Colon polyps   . Diverticulosis   . 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 No intervention required      History   Social History  . Marital Status: Married    Spouse Name: N/A    Number of Children: N/A  . Years of Education: N/A   Occupational History  . Not on file.   Social History Main Topics  . Smoking status: Never Smoker   . Smokeless tobacco: Not on file  . Alcohol Use: No  . Drug Use: No  . Sexual Activity: Not on file   Other Topics Concern  . Not on file   Social History Narrative  . No narrative on file    Past Surgical History  Procedure Laterality Date  . Appendectomy    . Tubal ligation      Family History  Problem Relation Age of Onset  . Stroke Mother   . Hypertension Mother   . Heart disease Father     CABG, MI  . Cancer Father     skin  . Diabetes Brother   . Heart disease Brother   . Hypertension Brother     Allergies  Allergen Reactions  . Aspirin     REACTION: hx of hemorrhagic stroke  . Celebrex [Celecoxib]   . Phenytoin   . Sulfonamide Derivatives      Current Outpatient Prescriptions on File Prior to Visit  Medication Sig Dispense Refill  . lisinopril-hydrochlorothiazide (PRINZIDE,ZESTORETIC) 20-25 MG per tablet TAKE ONE TABLET BY MOUTH EVERY DAY  30 tablet  0   No current facility-administered medications on file prior to visit.    Pulse 111  Temp(Src) 98.2 F (36.8 C) (Oral)  Ht 5\' 8"  (1.727 m)  Wt 134 lb (60.782 kg)  BMI 20.38 kg/m2  SpO2 98%chart    Objective:   Physical Exam  Constitutional: She is oriented to person, place, and time. She appears well-developed and well-nourished.  Eyes: Conjunctivae and EOM are normal. Pupils are equal, round, and reactive to light.  Neck: Normal range of motion. Neck supple.  Cardiovascular: Normal rate, regular rhythm and normal heart sounds.   Pulmonary/Chest: Effort normal and breath sounds normal.  Musculoskeletal: Normal range of motion. She exhibits no edema.  Neurological: She is alert and oriented to person, place, and time. She has normal reflexes. No cranial nerve deficit.  Skin: Skin is warm and dry.  Psychiatric: She has a normal mood and affect.          Assessment & Plan:  Assessment 1. Hypertension  Plan 1. Continue current medications. 2. Obtain lab that includes CMP.  3.Schedule physical over next two months. 4.Contact office for questions and concerns.

## 2013-07-06 NOTE — Progress Notes (Signed)
Pre visit review using our clinic review tool, if applicable. No additional management support is needed unless otherwise documented below in the visit note. 

## 2013-07-06 NOTE — Patient Instructions (Signed)
Sodium-Controlled Diet Sodium is a mineral. It is found in many foods. Sodium may be found naturally or added during the making of a food. The most common form of sodium is salt, which is made up of sodium and chloride. Reducing your sodium intake involves changing your eating habits. The following guidelines will help you reduce the sodium in your diet:  Stop using the salt shaker.  Use salt sparingly in cooking and baking.  Substitute with sodium-free seasonings and spices.  Do not use a salt substitute (potassium chloride) without your caregiver's permission.  Include a variety of fresh, unprocessed foods in your diet.  Limit the use of processed and convenience foods that are high in sodium. USE THE FOLLOWING FOODS SPARINGLY: Breads/Starches  Commercial bread stuffing, commercial pancake or waffle mixes, coating mixes. Waffles. Croutons. Prepared (boxed or frozen) potato, rice, or noodle mixes that contain salt or sodium. Salted French fries or hash browns. Salted popcorn, breads, crackers, chips, or snack foods. Vegetables  Vegetables canned with salt or prepared in cream, butter, or cheese sauces. Sauerkraut. Tomato or vegetable juices canned with salt.  Fresh vegetables are allowed if rinsed thoroughly. Fruit  Fruit is okay to eat. Meat and Meat Substitutes  Salted or smoked meats, such as bacon or Canadian bacon, chipped or corned beef, hot dogs, salt pork, luncheon meats, pastrami, ham, or sausage. Canned or smoked fish, poultry, or meat. Processed cheese or cheese spreads, blue or Roquefort cheese. Battered or frozen fish products. Prepared spaghetti sauce. Baked beans. Reuben sandwiches. Salted nuts. Caviar. Milk  Limit buttermilk to 1 cup per week. Soups and Combination Foods  Bouillon cubes, canned or dried soups, broth, consomm. Convenience (frozen or packaged) dinners with more than 600 mg sodium. Pot pies, pizza, Asian food, fast food cheeseburgers, and specialty  sandwiches. Desserts and Sweets  Regular (salted) desserts, pie, commercial fruit snack pies, commercial snack cakes, canned puddings.  Eat desserts and sweets in moderation. Fats and Oils  Gravy mixes or canned gravy. No more than 1 to 2 tbs of salad dressing. Chip dips.  Eat fats and oils in moderation. Beverages  See those listed under the vegetables and milk groups. Condiments  Ketchup, mustard, meat sauces, salsa, regular (salted) and lite soy sauce or mustard. Dill pickles, olives, meat tenderizer. Prepared horseradish or pickle relish. Dutch-processed cocoa. Baking powder or baking soda used medicinally. Worcestershire sauce. "Light" salt. Salt substitute, unless approved by your caregiver. Document Released: 08/28/2001 Document Revised: 05/31/2011 Document Reviewed: 03/31/2009 Exit[employer] Patient Information 2014 [employer].  

## 2013-07-07 ENCOUNTER — Telehealth: Payer: Self-pay | Admitting: Internal Medicine

## 2013-07-07 NOTE — Telephone Encounter (Signed)
Relevant patient education mailed to patient.  

## 2013-08-06 ENCOUNTER — Other Ambulatory Visit: Payer: Self-pay | Admitting: Internal Medicine

## 2013-09-05 ENCOUNTER — Encounter: Payer: Medicare Other | Admitting: Family

## 2013-09-26 ENCOUNTER — Encounter: Payer: Self-pay | Admitting: Family

## 2013-09-26 ENCOUNTER — Ambulatory Visit (INDEPENDENT_AMBULATORY_CARE_PROVIDER_SITE_OTHER): Payer: Medicare Other | Admitting: Family

## 2013-09-26 VITALS — BP 140/90 | HR 88 | Ht 65.0 in | Wt 128.0 lb

## 2013-09-26 DIAGNOSIS — I4891 Unspecified atrial fibrillation: Secondary | ICD-10-CM | POA: Insufficient documentation

## 2013-09-26 DIAGNOSIS — Z23 Encounter for immunization: Secondary | ICD-10-CM

## 2013-09-26 DIAGNOSIS — Z0000 Encounter for general adult medical examination without abnormal findings: Secondary | ICD-10-CM

## 2013-09-26 DIAGNOSIS — I10 Essential (primary) hypertension: Secondary | ICD-10-CM

## 2013-09-26 LAB — CBC WITH DIFFERENTIAL/PLATELET
Basophils Absolute: 0 10*3/uL (ref 0.0–0.1)
Basophils Relative: 0.3 % (ref 0.0–3.0)
EOS ABS: 0 10*3/uL (ref 0.0–0.7)
EOS PCT: 0.6 % (ref 0.0–5.0)
HCT: 48 % — ABNORMAL HIGH (ref 36.0–46.0)
HEMOGLOBIN: 16.1 g/dL — AB (ref 12.0–15.0)
LYMPHS PCT: 35 % (ref 12.0–46.0)
Lymphs Abs: 2.5 10*3/uL (ref 0.7–4.0)
MCHC: 33.6 g/dL (ref 30.0–36.0)
MCV: 87.1 fl (ref 78.0–100.0)
MONO ABS: 0.5 10*3/uL (ref 0.1–1.0)
MONOS PCT: 7.5 % (ref 3.0–12.0)
NEUTROS ABS: 4.1 10*3/uL (ref 1.4–7.7)
Neutrophils Relative %: 56.6 % (ref 43.0–77.0)
PLATELETS: 277 10*3/uL (ref 150.0–400.0)
RBC: 5.52 Mil/uL — ABNORMAL HIGH (ref 3.87–5.11)
RDW: 13.9 % (ref 11.5–15.5)
WBC: 7.2 10*3/uL (ref 4.0–10.5)

## 2013-09-26 LAB — LIPID PANEL
CHOL/HDL RATIO: 3
Cholesterol: 225 mg/dL — ABNORMAL HIGH (ref 0–200)
HDL: 73.5 mg/dL (ref >39.00)
LDL CALC: 137 mg/dL — AB (ref 0–99)
NONHDL: 151.5
TRIGLYCERIDES: 74 mg/dL (ref 0.0–149.0)
VLDL: 14.8 mg/dL (ref 0.0–40.0)

## 2013-09-26 LAB — POCT URINALYSIS DIPSTICK
BILIRUBIN UA: NEGATIVE
GLUCOSE UA: NEGATIVE
KETONES UA: NEGATIVE
Nitrite, UA: NEGATIVE
SPEC GRAV UA: 1.015
Urobilinogen, UA: 0.2
pH, UA: 7.5

## 2013-09-26 LAB — BASIC METABOLIC PANEL
BUN: 7 mg/dL (ref 6–23)
CALCIUM: 9.8 mg/dL (ref 8.4–10.5)
CO2: 26 mEq/L (ref 19–32)
Chloride: 92 mEq/L — ABNORMAL LOW (ref 96–112)
Creatinine, Ser: 0.7 mg/dL (ref 0.4–1.2)
GFR: 91.61 mL/min (ref >60.00)
GLUCOSE: 107 mg/dL — AB (ref 70–99)
Potassium: 3.6 mEq/L (ref 3.5–5.1)
SODIUM: 130 meq/L — AB (ref 135–145)

## 2013-09-26 LAB — TSH: TSH: 0.53 u[IU]/mL (ref 0.35–4.50)

## 2013-09-26 LAB — HEPATIC FUNCTION PANEL
ALK PHOS: 65 U/L (ref 39–117)
ALT: 21 U/L (ref 0–35)
AST: 33 U/L (ref 0–37)
Albumin: 4.4 g/dL (ref 3.5–5.2)
BILIRUBIN DIRECT: 0.2 mg/dL (ref 0.0–0.3)
BILIRUBIN TOTAL: 1.5 mg/dL — AB (ref 0.2–1.2)
TOTAL PROTEIN: 8.7 g/dL — AB (ref 6.0–8.3)

## 2013-09-26 NOTE — Patient Instructions (Addendum)
1. ASA 81MG  ONCE DAILY 2. CONSIDER XARELTO OR ELIQUIS TO PREVENT CLOT FORMATION THAT COULD CAUSE A STROKE, HEART ATTACK OR DEATH. 3. START METOPROLOL 25MG  TWICE DAILY 4. SEE CARDIOLOGY IN 1 WEEK.     Atrial Fibrillation Atrial fibrillation is a type of irregular heart rhythm (arrhythmia). During atrial fibrillation, the upper chambers of the heart (atria) quiver continuously in a chaotic pattern. This causes an irregular and often rapid heart rate.  Atrial fibrillation is the result of the heart becoming overloaded with disorganized signals that tell it to beat. These signals are normally released one at a time by a part of the right atrium called the sinoatrial node. They then travel from the atria to the lower chambers of the heart (ventricles), causing the atria and ventricles to contract and pump blood as they pass. In atrial fibrillation, parts of the atria outside of the sinoatrial node also release these signals. This results in two problems. First, the atria receive so many signals that they do not have time to fully contract. Second, the ventricles, which can only receive one signal at a time, beat irregularly and out of rhythm with the atria.  There are three types of atrial fibrillation:   Paroxysmal. Paroxysmal atrial fibrillation starts suddenly and stops on its own within a week.  Persistent. Persistent atrial fibrillation lasts for more than a week. It may stop on its own or with treatment.  Permanent. Permanent atrial fibrillation does not go away. Episodes of atrial fibrillation may lead to permanent atrial fibrillation. Atrial fibrillation can prevent your heart from pumping blood normally. It increases your risk of stroke and can lead to heart failure.  CAUSES   Heart conditions, including a heart attack, heart failure, coronary artery disease, and heart valve conditions.   Inflammation of the sac that surrounds the heart (pericarditis).  Blockage of an artery in the lungs  (pulmonary embolism).  Pneumonia or other infections.  Chronic lung disease.  Thyroid problems, especially if the thyroid is overactive (hyperthyroidism).  Caffeine, excessive alcohol use, and use of some illegal drugs.   Use of some medicines, including certain decongestants and diet pills.  Heart surgery.   Birth defects.  Sometimes, no cause can be found. When this happens, the atrial fibrillation is called lone atrial fibrillation. The risk of complications from atrial fibrillation increases if you have lone atrial fibrillation and you are age 36 years or older. RISK FACTORS  Heart failure.  Coronary artery disease.  Diabetes mellitus.   High blood pressure (hypertension).   Obesity.   Other arrhythmias.   Increased age. SYMPTOMS   A feeling that your heart is beating rapidly or irregularly.   A feeling of discomfort or pain in your chest.   Shortness of breath.   Sudden light-headedness or weakness.   Getting tired easily when exercising.   Urinating more often than normal (mainly when atrial fibrillation first begins).  In paroxysmal atrial fibrillation, symptoms may start and suddenly stop. DIAGNOSIS  Your health care provider may be able to detect atrial fibrillation when taking your pulse. Your health care provider may have you take a test called an ambulatory electrocardiogram (ECG). An ECG records your heartbeat patterns over a 24-hour period. You may also have other tests, such as:  Transthoracic echocardiogram (TTE). During echocardiography, sound waves are used to evaluate how blood flows through your heart.  Transesophageal echocardiogram (TEE).  Stress test. There is more than one type of stress test. If a stress test is needed,  ask your health care provider about which type is best for you.   Chest X-ray exam.  Blood tests.  Computed tomography (CT). TREATMENT  Treatment may include:  Treating any underlying conditions. For  example, if you have an overactive thyroid, treating the condition may correct atrial fibrillation.  Taking medicine. Medicines may be given to control a rapid heart rate or to prevent blood clots, heart failure, or a stroke.  Having a procedure to correct the rhythm of the heart:  Electrical cardioversion. During electrical cardioversion, a controlled, low-energy shock is delivered to the heart through your skin. If you have chest pain, very low blood pressure, or sudden heart failure, this procedure may need to be done as an emergency.  Catheter ablation. During this procedure, heart tissues that send the signals that cause atrial fibrillation are destroyed.  Maze or minimaze procedure. During this surgery, thin lines of heart tissue that carry the abnormal signals are destroyed. The maze procedure is an open-heart surgery. The minimaze procedure is a minimally invasive surgery. This means that small cuts are made to access the heart instead of a large opening.  Pulmonary venous isolation. During this surgery, tissue around the veins that carry blood from the lungs (pulmonary veins) is destroyed. This tissue is thought to carry the abnormal signals. HOME CARE INSTRUCTIONS   Only take medicines that your health care provider approves, and take them as directed. Some medicines can make atrial fibrillation worse or recur.  If blood thinners were prescribed by your health care provider, take them exactly as directed. Too much blood-thinning medicine can cause bleeding. If you take too little, you will not have the needed protection against stroke and other problems.  Perform blood tests at home if directed by your health care provider. Perform blood tests exactly as directed.  Quit smoking if you smoke.  Do not drink alcohol.  Do not drink caffeinated beverages such as coffee, soda, and some teas. You may drink decaffeinated coffee, soda, or tea.   Maintain a healthy weight.Do not use diet  pills unless your health care provider approves. They may make heart problems worse.   Follow diet instructions as directed by your health care provider.  Exercise regularly as directed by your health care provider.  Keep all follow-up appointments with your health care provider. PREVENTION  The following substances can cause atrial fibrillation to recur:   Caffeinated beverages.  Alcohol.  Certain medicines, especially those used for breathing problems.  Certain herbs and herbal medicines, such as those containing ephedra or ginseng.  Illegal drugs, such as cocaine and amphetamines. Sometimes medicines are given to prevent atrial fibrillation from recurring. Proper treatment of any underlying condition is also important in helping prevent recurrence.  SEEK MEDICAL CARE IF:  You notice a change in the rate, rhythm, or strength of your heartbeat.  You suddenly begin urinating more frequently.  You tire more easily when exerting yourself or exercising. SEEK IMMEDIATE MEDICAL CARE IF:   You have chest pain, abdominal pain, sweating, or weakness.  You feel nauseous.  You have shortness of breath.  You suddenly have swollen feet and ankles.  You feel dizzy.  Your face or limbs feel numb or weak.  You have a change in your vision or speech. MAKE SURE YOU:   Understand these instructions.  Will watch your condition.  Will get help right away if you are not doing well or get worse. Document Released: 03/08/2005 Document Revised: 03/13/2013 Document Reviewed: 04/18/2012 [employer] Patient  Information 2015 [employer]. This information is not intended to replace advice given to you by your health care provider. Make sure you discuss any questions you have with your health care provider.  

## 2013-09-26 NOTE — Progress Notes (Signed)
Pre visit review using our clinic review tool, if applicable. No additional management support is needed unless otherwise documented below in the visit note. 

## 2013-09-26 NOTE — Progress Notes (Signed)
Subjective:    Patient ID: Kayla Wilson, female    DOB: 19-Mar-1941, 73 y.o.   MRN: [id number]  HPI 73 year old white female, nonsmoker, is in today for a CPX. I reviewed all health maintenance protocols including mammography, colonoscopy, bone density Needed referrals were placed. Age and diagnosis  appropriate screening labs were ordered. Her immunization history was reviewed and appropriate vaccinations were ordered. Her current medications and allergies were reviewed and needed refills of her chronic medications were ordered. The plan for yearly health maintenance was discussed all orders and referrals were made as appropriate.   Review of Systems  Constitutional: Negative.   HENT: Negative.   Eyes: Negative.   Respiratory: Negative.   Cardiovascular: Negative.   Gastrointestinal: Negative.   Endocrine: Negative.   Genitourinary: Negative.   Musculoskeletal: Negative.   Skin: Negative.   Allergic/Immunologic: Negative.   Neurological: Negative.   Hematological: Negative.   Psychiatric/Behavioral: Negative.    Past Medical History  Diagnosis Date  . Atrial fibrillation   . Hypertension   . Stroke   . Colon polyps   . Diverticulosis   . 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 No intervention required      History   Social History  . Marital Status: Married    Spouse Name: N/A    Number of Children: N/A  . Years of Education: N/A   Occupational History  . Not on file.   Social History Main Topics  . Smoking status: Never Smoker   . Smokeless tobacco: Not on file  . Alcohol Use: No  . Drug Use: No  . Sexual Activity: Not on file   Other Topics Concern  . Not on file   Social History Narrative  . No narrative on file    Past Surgical History  Procedure Laterality Date  . Appendectomy    . Tubal ligation      Family History  Problem  Relation Age of Onset  . Stroke Mother   . Hypertension Mother   . Heart disease Father     CABG, MI  . Cancer Father     skin  . Diabetes Brother   . Heart disease Brother   . Hypertension Brother     Allergies  Allergen Reactions  . Aspirin     REACTION: hx of hemorrhagic stroke  . Celebrex [Celecoxib]   . Phenytoin   . Sulfonamide Derivatives     Current Outpatient Prescriptions on File Prior to Visit  Medication Sig Dispense Refill  . lisinopril-hydrochlorothiazide (PRINZIDE,ZESTORETIC) 20-25 MG per tablet TAKE ONE TABLET BY MOUTH ONCE DAILY  30 tablet  3   No current facility-administered medications on file prior to visit.    BP 140/90  Pulse 88  Ht 5\' 5"  (1.651 m)  Wt 128 lb (58.06 kg)  BMI 21.30 kg/m2chart    Objective:   Physical Exam  Constitutional: She is oriented to person, place, and time. She appears well-developed.  HENT:  Head: Normocephalic and atraumatic.  Right Ear: External ear normal.  Left Ear: External ear normal.  Nose: Nose normal.  Mouth/Throat: Oropharynx is clear and moist.  Eyes: Conjunctivae and EOM are normal. Pupils are equal, round, and reactive to light.  Neck: Normal range of motion. Neck supple. No thyromegaly present.  Cardiovascular: Normal heart sounds.   Atrial fibrillation, HR 131  Pulmonary/Chest: Effort normal and breath  sounds normal.  Abdominal: Soft. Bowel sounds are normal.  Musculoskeletal: Normal range of motion.  Neurological: She is alert and oriented to person, place, and time. She has normal reflexes. She displays normal reflexes. No cranial nerve deficit. Coordination normal.  Skin: Skin is warm and dry.  Psychiatric: She has a normal mood and affect.     Consulted with Dr. Tonia Brooms about new onset atrial fibrillation. Suggested an anticoagulant, metoprolol 25 mg twice daily and an echocardiogram.     Assessment & Plan:  Kayla Wilson was seen today for annual exam.  Diagnoses and associated orders for  this visit:  Preventative health care - Basic Metabolic Panel - CBC with Differential - POCT urinalysis dipstick - Lipid Panel - Hepatic Function Panel - POCT urinalysis dipstick - TSH  Unspecified essential hypertension - EKG 88-CZYS - Basic Metabolic Panel - CBC with Differential - POCT urinalysis dipstick - Lipid Panel - Hepatic Function Panel - POCT urinalysis dipstick - TSH  Need for prophylactic vaccination with tetanus toxoid alone - Td vaccine preservative free greater than or equal to 7yo IM - Basic Metabolic Panel - CBC with Differential - POCT urinalysis dipstick - Lipid Panel - Hepatic Function Panel - POCT urinalysis dipstick - TSH  Atrial fibrillation, unspecified - 2D Echocardiogram with contrast; Future   Spoke with patient at length about her being at high risk for stroke, heart attack, and death by refusing anticoagulation therapy. Patient reports Coumadin killed her mother. She in not interested in Xarelto nor Eliquis either. I have advised cardiology of her wishes and suggested 81 mg ASA in addition to therapy.

## 2013-09-27 ENCOUNTER — Telehealth: Payer: Self-pay | Admitting: Internal Medicine

## 2013-09-27 NOTE — Telephone Encounter (Signed)
Pt states she was supposed to start metoprolol 25 mg twice daily ,(right away,) but it is not at the pharm. pls advise! [facility]/ [address]

## 2013-09-28 MED ORDER — METOPROLOL TARTRATE 25 MG PO TABS: 25.0000 mg | ORAL_TABLET | Freq: Two times a day (BID) | ORAL | Status: DC

## 2013-09-28 NOTE — Telephone Encounter (Signed)
Rx sent and pt aware 

## 2013-10-04 ENCOUNTER — Ambulatory Visit (HOSPITAL_COMMUNITY): Payer: Medicare Other | Attending: Internal Medicine | Admitting: Radiology

## 2013-10-04 DIAGNOSIS — I4891 Unspecified atrial fibrillation: Secondary | ICD-10-CM | POA: Insufficient documentation

## 2013-10-04 NOTE — Progress Notes (Signed)
Echocardiogram performed.  

## 2013-10-08 ENCOUNTER — Other Ambulatory Visit: Payer: Self-pay | Admitting: Family

## 2013-10-08 DIAGNOSIS — I4891 Unspecified atrial fibrillation: Secondary | ICD-10-CM

## 2013-10-31 ENCOUNTER — Institutional Professional Consult (permissible substitution): Payer: Medicare Other | Admitting: Internal Medicine

## 2013-11-14 ENCOUNTER — Encounter: Payer: Self-pay | Admitting: Internal Medicine

## 2013-11-14 ENCOUNTER — Ambulatory Visit (INDEPENDENT_AMBULATORY_CARE_PROVIDER_SITE_OTHER): Payer: Medicare Other | Admitting: Internal Medicine

## 2013-11-14 VITALS — BP 172/112 | HR 131 | Ht 68.0 in | Wt 131.2 lb

## 2013-11-14 DIAGNOSIS — I519 Heart disease, unspecified: Secondary | ICD-10-CM | POA: Insufficient documentation

## 2013-11-14 DIAGNOSIS — I4891 Unspecified atrial fibrillation: Secondary | ICD-10-CM

## 2013-11-14 DIAGNOSIS — R9431 Abnormal electrocardiogram [ECG] [EKG]: Secondary | ICD-10-CM

## 2013-11-14 DIAGNOSIS — I10 Essential (primary) hypertension: Secondary | ICD-10-CM

## 2013-11-14 MED ORDER — CARVEDILOL 12.5 MG PO TABS: 12.5000 mg | ORAL_TABLET | Freq: Two times a day (BID) | ORAL | Status: DC

## 2013-11-14 NOTE — Patient Instructions (Signed)
Your physician recommends that you schedule a follow-up appointment in: 6 weeks with Ceasar Lund  Your physician has requested that you have a lexiscan myoview. For further information please visit [email or web address]. Please follow instruction sheet, as given.  Your physician has recommended you make the following change in your medication:  1)Stop Metoprolol 2)Start Carvedilol 12.5mg  twice daily
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
## 2013-11-28 ENCOUNTER — Ambulatory Visit (HOSPITAL_COMMUNITY): Payer: Medicare Other | Attending: Cardiology | Admitting: Radiology

## 2013-11-28 VITALS — BP 165/104 | Ht 68.0 in | Wt 132.0 lb

## 2013-11-28 DIAGNOSIS — R9431 Abnormal electrocardiogram [ECG] [EKG]: Secondary | ICD-10-CM | POA: Insufficient documentation

## 2013-11-28 DIAGNOSIS — R0609 Other forms of dyspnea: Secondary | ICD-10-CM | POA: Insufficient documentation

## 2013-11-28 DIAGNOSIS — R9439 Abnormal result of other cardiovascular function study: Secondary | ICD-10-CM | POA: Insufficient documentation

## 2013-11-28 DIAGNOSIS — R0989 Other specified symptoms and signs involving the circulatory and respiratory systems: Secondary | ICD-10-CM | POA: Insufficient documentation

## 2013-11-28 DIAGNOSIS — I10 Essential (primary) hypertension: Secondary | ICD-10-CM | POA: Diagnosis not present

## 2013-11-28 DIAGNOSIS — I4891 Unspecified atrial fibrillation: Secondary | ICD-10-CM | POA: Diagnosis not present

## 2013-11-28 DIAGNOSIS — R002 Palpitations: Secondary | ICD-10-CM | POA: Insufficient documentation

## 2013-11-28 MED ORDER — TECHNETIUM TC 99M SESTAMIBI GENERIC - CARDIOLITE
33.0000 | Freq: Once | INTRAVENOUS | Status: AC | PRN
  Administered 2013-11-28: 33 via INTRAVENOUS

## 2013-11-28 MED ORDER — TECHNETIUM TC 99M SESTAMIBI GENERIC - CARDIOLITE
11.0000 | Freq: Once | INTRAVENOUS | Status: AC | PRN
  Administered 2013-11-28: 11 via INTRAVENOUS

## 2013-11-28 MED ORDER — REGADENOSON 0.4 MG/5ML IV SOLN
0.4000 mg | Freq: Once | INTRAVENOUS | Status: AC
  Administered 2013-11-28: 0.4 mg via INTRAVENOUS

## 2013-11-28 NOTE — Progress Notes (Signed)
[facility] [place], [place] [address] [phone]    Cardiology Nuclear Med Study  Kayla Wilson is a 73 y.o. female     MRN : [id number]     DOB: 27-Jun-1940  Procedure Date: 11/28/2013  Nuclear Med Background Indication for Stress Test:  Evaluation for Ischemia and Abnormal EKG History:  AFIB RVR  Cardiac Risk Factors: Hypertension  Symptoms:  DOE and Palpitations   Nuclear Pre-Procedure Caffeine/Decaff Intake:  None NPO After: 7:00am   Lungs:  clear O2 Sat: 96% on room air. IV 0.9% NS with Angio Cath:  22g  IV Site: R Antecubital  IV Started by:  Crissie Figures, RN  Chest Size (in):  36 Cup Size: A  Height: 5\' 8"  (1.727 m)  Weight:  132 lb (59.875 kg)  BMI:  Body mass index is 20.08 kg/(m^2). Tech Comments:  Coreg held x 12 hrs    Nuclear Med Study 1 or 2 day study: 1 day  Stress Test Type:  Lexiscan  Reading MD: N/A  Order Authorizing Provider:  Thompson Grayer, MD  Resting Radionuclide: Technetium 46m Sestamibi  Resting Radionuclide Dose: 11.0 mCi   Stress Radionuclide:  Technetium 49m Sestamibi  Stress Radionuclide Dose: 33.0 mCi           Stress Protocol Rest HR: 120 Stress HR: 134  Rest BP: 165/104 Stress BP: 163/102  Exercise Time (min): n/a METS: n/a   Predicted Max HR: 147 bpm % Max HR: 81.63 bpm Rate Pressure Product: 19800   Dose of Adenosine (mg):  n/a Dose of Lexiscan: 0.4 mg  Dose of Atropine (mg): n/a Dose of Dobutamine: n/a mcg/kg/min (at max HR)  Stress Test Technologist: Perrin Maltese, EMT-P  Nuclear Technologist:  Vedia Pereyra, CNMT     Rest Procedure:  Myocardial perfusion imaging was performed at rest 45 minutes following the intravenous administration of Technetium 23m Sestamibi. Rest ECG: NSR with non-specific ST-T wave changes  Stress Procedure:  The patient received IV Lexiscan 0.4 mg over 15-seconds.  Technetium 40m Sestamibi injected at 30-seconds. This patient had sob, neck, and arm  tightness with the Lexiscan injection. Quantitative spect images were obtained after a 45 minute delay. Stress ECG: Insignificant additional ST segment depression.  QPS Raw Data Images:  Normal; no motion artifact; normal heart/lung ratio. Stress Images:  Normal homogeneous uptake in all areas of the myocardium. Rest Images:  Normal homogeneous uptake in all areas of the myocardium. Subtraction (SDS):  No evidence of ischemia. Transient Ischemic Dilatation (Normal <1.22):  0.98 Lung/Heart Ratio (Normal <0.45):  0.30  Quantitative Gated Spect Images QGS EDV:  94 ml QGS ESV:  56 ml  Impression Exercise Capacity:  Lexiscan with no exercise. BP Response:  Hypertensive blood pressure response. Clinical Symptoms:  No significant symptoms noted. ECG Impression:  Nonspecific additional ST segment depression. Comparison with Prior Nuclear Study: No previous nuclear study performed  Overall Impression:  Low risk stress nuclear study. 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  LV Wall Motion:  Global hypokinesis, modertely depressed overall LV systolic function  Sanda Klein, MD, [facility] [phone] office [phone] pager

## 2013-12-12 ENCOUNTER — Other Ambulatory Visit: Payer: Self-pay | Admitting: Internal Medicine

## 2013-12-28 ENCOUNTER — Ambulatory Visit (HOSPITAL_COMMUNITY)
Admission: RE | Admit: 2013-12-28 | Discharge: 2013-12-28 | Disposition: A | Discharge status: Home / Self Care | Payer: Medicare Other | Source: Ambulatory Visit | Attending: Nurse Practitioner | Admitting: Nurse Practitioner

## 2013-12-28 ENCOUNTER — Encounter (HOSPITAL_COMMUNITY): Payer: Self-pay | Admitting: Nurse Practitioner

## 2013-12-28 VITALS — BP 124/83 | HR 80 | Resp 18 | Wt 134.4 lb

## 2013-12-28 DIAGNOSIS — I10 Essential (primary) hypertension: Secondary | ICD-10-CM | POA: Insufficient documentation

## 2013-12-28 DIAGNOSIS — I481 Persistent atrial fibrillation: Secondary | ICD-10-CM | POA: Diagnosis not present

## 2013-12-28 DIAGNOSIS — Z8673 Personal history of transient ischemic attack (TIA), and cerebral infarction without residual deficits: Secondary | ICD-10-CM | POA: Insufficient documentation

## 2013-12-28 DIAGNOSIS — Z7982 Long term (current) use of aspirin: Secondary | ICD-10-CM | POA: Diagnosis not present

## 2013-12-28 DIAGNOSIS — I482 Chronic atrial fibrillation, unspecified: Secondary | ICD-10-CM

## 2013-12-28 DIAGNOSIS — I480 Paroxysmal atrial fibrillation: Secondary | ICD-10-CM | POA: Diagnosis present

## 2013-12-28 DIAGNOSIS — I5189 Other ill-defined heart diseases: Secondary | ICD-10-CM | POA: Insufficient documentation

## 2013-12-28 DIAGNOSIS — Z8249 Family history of ischemic heart disease and other diseases of the circulatory system: Secondary | ICD-10-CM | POA: Diagnosis not present

## 2013-12-28 NOTE — Patient Instructions (Addendum)
Follow up in 6 months with Dr. Thompson Grayer at the main office on [facility] [phone]
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
## 2013-12-30 NOTE — Addendum Note (Signed)
Encounter addended by: Evalee Mutton, CCT on: 12/30/2013 12:07 PM<BR>     Documentation filed: Charges VN

## 2014-03-05 ENCOUNTER — Other Ambulatory Visit: Payer: Self-pay | Admitting: Family

## 2014-05-30 ENCOUNTER — Other Ambulatory Visit: Payer: Self-pay | Admitting: Family

## 2014-07-08 ENCOUNTER — Ambulatory Visit (HOSPITAL_COMMUNITY): Payer: Medicare Other | Attending: Cardiovascular Disease

## 2014-07-08 DIAGNOSIS — I482 Chronic atrial fibrillation, unspecified: Secondary | ICD-10-CM

## 2014-07-08 DIAGNOSIS — I4891 Unspecified atrial fibrillation: Secondary | ICD-10-CM

## 2014-07-08 NOTE — Progress Notes (Signed)
2D Echo completed. 07/08/2014

## 2014-07-09 ENCOUNTER — Telehealth (HOSPITAL_COMMUNITY): Payer: Self-pay | Admitting: *Deleted

## 2014-07-09 NOTE — Telephone Encounter (Signed)
Patient notified of echo results

## 2014-07-15 ENCOUNTER — Ambulatory Visit (INDEPENDENT_AMBULATORY_CARE_PROVIDER_SITE_OTHER): Payer: Medicare Other | Admitting: Internal Medicine

## 2014-07-15 ENCOUNTER — Encounter: Payer: Self-pay | Admitting: Internal Medicine

## 2014-07-15 VITALS — BP 122/98 | HR 90 | Ht 67.5 in | Wt 132.6 lb

## 2014-07-15 DIAGNOSIS — I519 Heart disease, unspecified: Secondary | ICD-10-CM | POA: Diagnosis not present

## 2014-07-15 DIAGNOSIS — I481 Persistent atrial fibrillation: Secondary | ICD-10-CM

## 2014-07-15 DIAGNOSIS — I4819 Other persistent atrial fibrillation: Secondary | ICD-10-CM

## 2014-07-15 NOTE — Progress Notes (Signed)
Electrophysiology Office Note   Date:  07/15/2014   ID:  Kayla, Wilson [birth year], MRN [id number]  PCP:  Kayla Ast, FNP   Primary Electrophysiologist: Thompson Grayer, MD    Chief Complaint  Patient presents with  . Atrial Fibrillation     History of Present Illness: Kayla Wilson is a 74 y.o. female who presents today for electrophysiology evaluation.   Asymptomatic with afib.  Continues to decline anticoagulation.  Her primary concern today is with chronic knee pain. Today, she denies symptoms of palpitations, chest pain, shortness of breath, orthopnea, PND, lower extremity edema, claudication, dizziness, presyncope, syncope, bleeding, or neurologic sequela. The patient is tolerating medications without difficulties and is otherwise without complaint today.    Past Medical History  Diagnosis Date  . Atrial fibrillation   . Hypertension   . Stroke   . Colon polyps   . Diverticulosis   . 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 No intervention required     Past Surgical History  Procedure Laterality Date  . Appendectomy    . Tubal ligation       Current Outpatient Prescriptions  Medication Sig Dispense Refill  . aspirin 81 MG tablet Take 81 mg by mouth daily.    . carvedilol (COREG) 12.5 MG tablet Take 1 tablet (12.5 mg total) by mouth 2 (two) times daily. 180 tablet 3  . lisinopril-hydrochlorothiazide (PRINZIDE,ZESTORETIC) 20-25 MG per tablet TAKE ONE TABLET BY MOUTH ONCE DAILY 90 tablet 1  . vitamin B-12 (CYANOCOBALAMIN) 500 MCG tablet Take 500 mcg by mouth daily.     No current facility-administered medications for this visit.    Allergies:   Celebrex; Aspirin; Phenytoin; and Sulfonamide derivatives   Social History:  The patient  reports that she has never smoked. She does not have any smokeless tobacco history on file. She  reports that she does not drink alcohol or use illicit drugs.   Family History:  The patient's  family history includes Cancer in her father; Diabetes in her brother; Heart disease in her brother and father; Hypertension in her brother and mother; Stroke in her mother.    ROS:  Please see the history of present illness.   All other systems are reviewed and negative.    PHYSICAL EXAM: VS:  BP 122/98 mmHg  Pulse 90  Ht 5' 7.5" (1.715 m)  Wt 132 lb 9.6 oz (60.147 kg)  BMI 20.45 kg/m2 , BMI Body mass index is 20.45 kg/(m^2). GEN: Well nourished, well developed, in no acute distress HEENT: normal Neck: no JVD, carotid bruits, or masses Cardiac: iRRR; 2/6 SEM at the apex Respiratory:  clear to auscultation bilaterally, normal work of breathing GI: soft, nontender, nondistended, + BS MS: no deformity or atrophy Skin: warm and dry  Neuro:  Strength and sensation are intact Psych: euthymic mood, full affect  EKG:  EKG is ordered today. The ekg ordered today shows afib, V rate 90 bpm, nonspecific St/T changes   Recent Labs: 09/26/2013: ALT 21; BUN 7; Creatinine 0.7; Hemoglobin 16.1*; Platelets 277.0; Potassium 3.6; Sodium 130*; TSH 0.53    Lipid Panel     Component Value Date/Time   CHOL 225* 09/26/2013 1100   TRIG 74.0 09/26/2013 1100   HDL 73.50 09/26/2013 1100   CHOLHDL 3 09/26/2013 1100   VLDL 14.8 09/26/2013 1100   LDLCALC 137* 09/26/2013 1100   LDLDIRECT 143.5  11/13/2008 0958     Wt Readings from Last 3 Encounters:  07/15/14 132 lb 9.6 oz (60.147 kg)  11/28/13 132 lb (59.875 kg)  11/14/13 131 lb 4 oz (59.535 kg)      Other studies Reviewed: Additional studies/ records that were reviewed today include: AF clinic notes    ASSESSMENT AND PLAN:  1.  Persistent afib Declines anticoagulation despite elevated chads2vasc score I had a long discussion today in which I have informed her strongly that our recommendation is anticoagulation with Elbert.  I discussed AVERROES  data which supports eliquis as superior to ASA.  I also discussed watchman as an alternative to anticoagulation.  She says "You need a lot more [place] pigs before you do anything to me"... Continue rate control long term  2. Chronic systolic dysfunction EF improved with rate control (4/16 echo is reviewed)  Follow-up in the AF clinic with Roderic Palau NP every 6 months I will see as needed going forward  Current medicines are reviewed at length with the patient today.   The patient does not have concerns regarding her medicines.  The following changes were made today:  none   Signed, Thompson Grayer, MD  07/15/2014 11:37 AM     [facility] [address] [place]  [address] [phone] (office) [phone] (fax)

## 2014-07-15 NOTE — Patient Instructions (Signed)
Medication Instructions:  None ordered  Labwork: None ordered  Testing/Procedures: None ordered  Follow-Up: Your physician wants you to follow-up in: 6 months with Kayla Palau, NP You will receive a reminder letter in the mail two months in advance. If you don't receive a letter, please call our office to schedule the follow-up appointment.   Any Other Special Instructions Will Be Listed Below (If Applicable).

## 2014-07-18 ENCOUNTER — Encounter: Payer: Self-pay | Admitting: Adult Health

## 2014-07-18 ENCOUNTER — Ambulatory Visit (INDEPENDENT_AMBULATORY_CARE_PROVIDER_SITE_OTHER): Payer: Medicare Other | Admitting: Adult Health

## 2014-07-18 VITALS — BP 120/88 | Temp 97.6°F | Wt 132.7 lb

## 2014-07-18 DIAGNOSIS — M25551 Pain in right hip: Secondary | ICD-10-CM

## 2014-07-18 DIAGNOSIS — I4891 Unspecified atrial fibrillation: Secondary | ICD-10-CM | POA: Diagnosis not present

## 2014-07-18 DIAGNOSIS — M25562 Pain in left knee: Secondary | ICD-10-CM

## 2014-07-18 DIAGNOSIS — Z7689 Persons encountering health services in other specified circumstances: Secondary | ICD-10-CM

## 2014-07-18 DIAGNOSIS — Z7189 Other specified counseling: Secondary | ICD-10-CM

## 2014-07-18 NOTE — Progress Notes (Signed)
Pre visit review using our clinic review tool, if applicable. No additional management support is needed unless otherwise documented below in the visit note. 

## 2014-07-18 NOTE — Patient Instructions (Addendum)
Follow up with me in July for your complete physical. I placed a referral to orthopedics for you and they will call you to schedule an appointment. In the mean time continue taking tylenol and using ice and heat. If you need anything, please let me know. It was great meeting you!   Health Maintenance Adopting a healthy lifestyle and getting preventive care can go a long way to promote health and wellness. Talk with your health care provider about what schedule of regular examinations is right for you. This is a good chance for you to check in with your provider about disease prevention and staying healthy. In between checkups, there are plenty of things you can do on your own. Experts have done a lot of research about which lifestyle changes and preventive measures are most likely to keep you healthy. Ask your health care provider for more information. WEIGHT AND DIET  Eat a healthy diet  Be sure to include plenty of vegetables, fruits, low-fat dairy products, and lean protein.  Do not eat a lot of foods high in solid fats, added sugars, or salt.  Get regular exercise. This is one of the most important things you can do for your health.  Most adults should exercise for at least 150 minutes each week. The exercise should increase your heart rate and make you sweat (moderate-intensity exercise).  Most adults should also do strengthening exercises at least twice a week. This is in addition to the moderate-intensity exercise.  Maintain a healthy weight  Body mass index (BMI) is a measurement that can be used to identify possible weight problems. It estimates body fat based on height and weight. Your health care provider can help determine your BMI and help you achieve or maintain a healthy weight.  For females 75 years of age and older:   A BMI below 18.5 is considered underweight.  A BMI of 18.5 to 24.9 is normal.  A BMI of 25 to 29.9 is considered overweight.  A BMI of 30 and above is  considered obese.  Watch levels of cholesterol and blood lipids  You should start having your blood tested for lipids and cholesterol at 74 years of age, then have this test every 5 years.  You may need to have your cholesterol levels checked more often if:  Your lipid or cholesterol levels are high.  You are older than 74 years of age.  You are at high risk for heart disease.  CANCER SCREENING   Lung Cancer  Lung cancer screening is recommended for adults 18-49 years old who are at high risk for lung cancer because of a history of smoking.  A yearly low-dose CT scan of the lungs is recommended for people who:  Currently smoke.  Have quit within the past 15 years.  Have at least a 30-pack-year history of smoking. A pack year is smoking an average of one pack of cigarettes a day for 1 year.  Yearly screening should continue until it has been 15 years since you quit.  Yearly screening should stop if you develop a health problem that would prevent you from having lung cancer treatment.  Breast Cancer  Practice breast self-awareness. This means understanding how your breasts normally appear and feel.  It also means doing regular breast self-exams. Let your health care provider know about any changes, no matter how small.  If you are in your 20s or 30s, you should have a clinical breast exam (CBE) by a health care provider  every 1-3 years as part of a regular health exam.  If you are 40 or older, have a CBE every year. Also consider having a breast X-ray (mammogram) every year.  If you have a family history of breast cancer, talk to your health care provider about genetic screening.  If you are at high risk for breast cancer, talk to your health care provider about having an MRI and a mammogram every year.  Breast cancer gene (BRCA) assessment is recommended for women who have family members with BRCA-related cancers. BRCA-related cancers  include:  Breast.  Ovarian.  Tubal.  Peritoneal cancers.  Results of the assessment will determine the need for genetic counseling and BRCA1 and BRCA2 testing. Cervical Cancer Routine pelvic examinations to screen for cervical cancer are no longer recommended for nonpregnant women who are considered low risk for cancer of the pelvic organs (ovaries, uterus, and vagina) and who do not have symptoms. A pelvic examination may be necessary if you have symptoms including those associated with pelvic infections. Ask your health care provider if a screening pelvic exam is right for you.   The Pap test is the screening test for cervical cancer for women who are considered at risk.  If you had a hysterectomy for a problem that was not cancer or a condition that could lead to cancer, then you no longer need Pap tests.  If you are older than 65 years, and you have had normal Pap tests for the past 10 years, you no longer need to have Pap tests.  If you have had past treatment for cervical cancer or a condition that could lead to cancer, you need Pap tests and screening for cancer for at least 20 years after your treatment.  If you no longer get a Pap test, assess your risk factors if they change (such as having a new sexual partner). This can affect whether you should start being screened again.  Some women have medical problems that increase their chance of getting cervical cancer. If this is the case for you, your health care provider may recommend more frequent screening and Pap tests.  The human papillomavirus (HPV) test is another test that may be used for cervical cancer screening. The HPV test looks for the virus that can cause cell changes in the cervix. The cells collected during the Pap test can be tested for HPV.  The HPV test can be used to screen women 30 years of age and older. Getting tested for HPV can extend the interval between normal Pap tests from three to five years.  An HPV  test also should be used to screen women of any age who have unclear Pap test results.  After 74 years of age, women should have HPV testing as often as Pap tests.  Colorectal Cancer  This type of cancer can be detected and often prevented.  Routine colorectal cancer screening usually begins at 74 years of age and continues through 75 years of age.  Your health care provider may recommend screening at an earlier age if you have risk factors for colon cancer.  Your health care provider may also recommend using home test kits to check for hidden blood in the stool.  A small camera at the end of a tube can be used to examine your colon directly (sigmoidoscopy or colonoscopy). This is done to check for the earliest forms of colorectal cancer.  Routine screening usually begins at age 50.  Direct examination of the colon should   be repeated every 5-10 years through 75 years of age. However, you may need to be screened more often if early forms of precancerous polyps or small growths are found. Skin Cancer  Check your skin from head to toe regularly.  Tell your health care provider about any new moles or changes in moles, especially if there is a change in a mole's shape or color.  Also tell your health care provider if you have a mole that is larger than the size of a pencil eraser.  Always use sunscreen. Apply sunscreen liberally and repeatedly throughout the day.  Protect yourself by wearing long sleeves, pants, a wide-brimmed hat, and sunglasses whenever you are outside. HEART DISEASE, DIABETES, AND HIGH BLOOD PRESSURE   Have your blood pressure checked at least every 1-2 years. High blood pressure causes heart disease and increases the risk of stroke.  If you are between 55 years and 79 years old, ask your health care provider if you should take aspirin to prevent strokes.  Have regular diabetes screenings. This involves taking a blood sample to check your fasting blood sugar  level.  If you are at a normal weight and have a low risk for diabetes, have this test once every three years after 74 years of age.  If you are overweight and have a high risk for diabetes, consider being tested at a younger age or more often. PREVENTING INFECTION  Hepatitis B  If you have a higher risk for hepatitis B, you should be screened for this virus. You are considered at high risk for hepatitis B if:  You were born in a country where hepatitis B is common. Ask your health care provider which countries are considered high risk.  Your parents were born in a high-risk country, and you have not been immunized against hepatitis B (hepatitis B vaccine).  You have HIV or AIDS.  You use needles to inject street drugs.  You live with someone who has hepatitis B.  You have had sex with someone who has hepatitis B.  You get hemodialysis treatment.  You take certain medicines for conditions, including cancer, organ transplantation, and autoimmune conditions. Hepatitis C  Blood testing is recommended for:  Everyone born from 1945 through 1965.  Anyone with known risk factors for hepatitis C. Sexually transmitted infections (STIs)  You should be screened for sexually transmitted infections (STIs) including gonorrhea and chlamydia if:  You are sexually active and are younger than 74 years of age.  You are older than 74 years of age and your health care provider tells you that you are at risk for this type of infection.  Your sexual activity has changed since you were last screened and you are at an increased risk for chlamydia or gonorrhea. Ask your health care provider if you are at risk.  If you do not have HIV, but are at risk, it may be recommended that you take a prescription medicine daily to prevent HIV infection. This is called pre-exposure prophylaxis (PrEP). You are considered at risk if:  You are sexually active and do not regularly use condoms or know the HIV status  of your partner(s).  You take drugs by injection.  You are sexually active with a partner who has HIV. Talk with your health care provider about whether you are at high risk of being infected with HIV. If you choose to begin PrEP, you should first be tested for HIV. You should then be tested every 3 months for as   long as you are taking PrEP.  PREGNANCY   If you are premenopausal and you may become pregnant, ask your health care provider about preconception counseling.  If you may become pregnant, take 400 to 800 micrograms (mcg) of folic acid every day.  If you want to prevent pregnancy, talk to your health care provider about birth control (contraception). OSTEOPOROSIS AND MENOPAUSE   Osteoporosis is a disease in which the bones lose minerals and strength with aging. This can result in serious bone fractures. Your risk for osteoporosis can be identified using a bone density scan.  If you are 5 years of age or older, or if you are at risk for osteoporosis and fractures, ask your health care provider if you should be screened.  Ask your health care provider whether you should take a calcium or vitamin D supplement to lower your risk for osteoporosis.  Menopause may have certain physical symptoms and risks.  Hormone replacement therapy may reduce some of these symptoms and risks. Talk to your health care provider about whether hormone replacement therapy is right for you.  HOME CARE INSTRUCTIONS   Schedule regular health, dental, and eye exams.  Stay current with your immunizations.   Do not use any tobacco products including cigarettes, chewing tobacco, or electronic cigarettes.  If you are pregnant, do not drink alcohol.  If you are breastfeeding, limit how much and how often you drink alcohol.  Limit alcohol intake to no more than 1 drink per day for nonpregnant women. One drink equals 12 ounces of beer, 5 ounces of wine, or 1 ounces of hard liquor.  Do not use street  drugs.  Do not share needles.  Ask your health care provider for help if you need support or information about quitting drugs.  Tell your health care provider if you often feel depressed.  Tell your health care provider if you have ever been abused or do not feel safe at home. Document Released: 09/21/2010 Document Revised: 07/23/2013 Document Reviewed: 02/07/2013 [facility] Patient Information 2015 [place], [place]. This information is not intended to replace advice given to you by your health care provider. Make sure you discuss any questions you have with your health care provider.

## 2014-07-18 NOTE — Progress Notes (Signed)
HPI:  Kayla Wilson is here to establish care.  Last PCP and physical: Was in July 2015 with NP Kayla Wilson   Dentist: Has false teeth Eye Doctor: 4 years ago, needs to have cataract surgery.   Has the following chronic problems that require follow up and concerns today:  Left Knee pain  - a couple of years ago she "twisted it" Has been getting worse. Is unable to climb stairs, carry laundry and is having difficulty performing ADL's. She takes Tylenol when her pain is "really bad", which helps and also uses ice and elevation. She would like a referral to orthopedic surgery for evaluation   Right hip pain- has been going on for a couple of years as well. Her hip pain is more bothersome when she is walking and climbing stairs. She takes tylenol on occasion for this pain and would also like a referral to orthopedic surgery for this issue as well.   Hypertension  - Stable on current regimen.  Stroke - Was 20 years ago and has no residual.   A-fib - Saw Cardiology on Monday of this week. They placed her on Coreg and ASA 81mg    ROS negative for unless reported above: fevers, unintentional weight loss, hearing or vision loss, chest pain, palpitations, struggling to breath, hemoptysis, melena, hematochezia, hematuria, falls, loc, si, thoughts of self harm  Past Medical History  Diagnosis Date  . Atrial fibrillation   . Hypertension   . Stroke   . Colon polyps   . Diverticulosis   . 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 No intervention required      Past Surgical History  Procedure Laterality Date  . Appendectomy    . Tubal ligation      Family History  Problem Relation Age of Onset  . Stroke Mother   . Hypertension Mother   . Heart disease Father     CABG, MI  . Cancer Father     skin  . Diabetes Brother   . Heart disease Brother   . Hypertension  Brother     History   Social History  . Marital Status: Married    Spouse Name: N/A  . Number of Children: N/A  . Years of Education: N/A   Social History Main Topics  . Smoking status: Never Smoker   . Smokeless tobacco: Not on file  . Alcohol Use: No  . Drug Use: No  . Sexual Activity: Not on file   Other Topics Concern  . None   Social History Narrative     Current outpatient prescriptions:  .  aspirin 81 MG tablet, Take 81 mg by mouth daily., Disp: , Rfl:  .  carvedilol (COREG) 12.5 MG tablet, Take 1 tablet (12.5 mg total) by mouth 2 (two) times daily., Disp: 180 tablet, Rfl: 3 .  lisinopril-hydrochlorothiazide (PRINZIDE,ZESTORETIC) 20-25 MG per tablet, TAKE ONE TABLET BY MOUTH ONCE DAILY, Disp: 90 tablet, Rfl: 1 .  vitamin B-12 (CYANOCOBALAMIN) 500 MCG tablet, Take 500 mcg by mouth daily., Disp: , Rfl:   EXAM:  Filed Vitals:   07/18/14 1057  BP: 120/88  Temp: 97.6 F (36.4 C)    Body mass index is 20.46 kg/(m^2).  GENERAL: vitals reviewed and listed above, alert, oriented, appears well hydrated and in no acute distress  HEENT: atraumatic, conjunttiva clear, no obvious abnormalities on inspection of external nose and ears. She  does have cataracts in bilateral eyes. She has had eye surgery in the past which caused her peripheral vision in her left eye to be diminished.   NECK: no obvious masses on inspection  LUNGS: clear to auscultation bilaterally, no wheezes, rales or rhonchi, good air movement  CV: A-fib,no peripheral edemaMS: moves all extremities without noticeable abnormality  MS: Uses a cane. She is able to get out of chair up to exam table independently. Has no joint, bone or muscle abnormalities.    PSYCH: pleasant and cooperative, no obvious depression or anxiety  ASSESSMENT AND PLAN:  Discussed the following assessment and plan:  1. Encounter to establish care She is due for multiple health maintenance items, including:bone density,  Colonoscopy, mammogram, PV 13 and Zostavax. She refused all of them. Risks were explained to patient and she understands the complications by refusing these treatments.   2. Left knee pain - Ambulatory referral to Orthopedic Surgery - Refused Bone Density - Would rather wait for x ray when she sees ortho  3. Right hip pain - Refused Bone Density - Would rather wait for x ray when she sees ortho - Ambulatory referral to Orthopedic Surgery  4. Atrial fibrillation, unspecified - Continue with recommendation from cardiology.    -We reviewed the PMH, PSH, FH, SH, Meds and Allergies. -We provided refills for any medications we will prescribe as needed. -We addressed current concerns per orders and patient instructions. -We have advised patient to follow up per instructions below.   -Patient advised to return or notify a provider immediately if symptoms worsen or persist or new concerns arise.  There are no Patient Instructions on file for this visit.   Kayla Wilson, AGNP

## 2014-07-19 DIAGNOSIS — M1611 Unilateral primary osteoarthritis, right hip: Secondary | ICD-10-CM | POA: Diagnosis not present

## 2014-07-19 DIAGNOSIS — M1712 Unilateral primary osteoarthritis, left knee: Secondary | ICD-10-CM | POA: Diagnosis not present

## 2014-07-19 DIAGNOSIS — M545 Low back pain: Secondary | ICD-10-CM | POA: Diagnosis not present

## 2014-08-14 DIAGNOSIS — M1611 Unilateral primary osteoarthritis, right hip: Secondary | ICD-10-CM | POA: Diagnosis not present

## 2014-08-16 DIAGNOSIS — M1611 Unilateral primary osteoarthritis, right hip: Secondary | ICD-10-CM | POA: Diagnosis not present

## 2014-08-16 NOTE — H&P (Signed)
TOTAL HIP ADMISSION H&P  Patient is admitted for right total hip arthroplasty.  Subjective:  Chief Complaint: right hip pain  HPI: Kayla Wilson, 74 y.o. female, has a history of pain and functional disability in the right hip(s) due to arthritis and patient has failed non-surgical conservative treatments for greater than 12 weeks to include NSAID's and/or analgesics, use of assistive devices and activity modification.  Onset of symptoms was gradual starting 6 years ago with gradually worsening course since that time.The patient noted no past surgery on the right hip(s).  Patient currently rates pain in the right hip at 7 out of 10 with activity. Patient has night pain, worsening of pain with activity and weight bearing, pain that interfers with activities of daily living and crepitus. Patient has evidence of joint space narrowing by imaging studies. This condition presents safety issues increasing the risk of falls.  There is no current active infection.  Patient Active Problem List   Diagnosis Date Noted  . Chronic systolic dysfunction of left ventricle 11/14/2013  . Atrial fibrillation 09/26/2013  . UNSPECIFIED VENOUS INSUFFICIENCY 11/21/2008  . PSORIASIS 02/16/2008  . 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 DIVERTICULOSIS, COLON 01/12/2007  . COLONIC POLYPS, HX OF 01/12/2007  . HYPERTENSION 11/15/2006   Past Medical History  Diagnosis Date  . Atrial fibrillation   . Hypertension   . Stroke   . Colon polyps   . Diverticulosis   . 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 No intervention required      Past Surgical History  Procedure Laterality Date  . Appendectomy    . Tubal ligation      No prescriptions prior to admission   Allergies  Allergen Reactions  . Celebrex [Celecoxib] Itching and Other (See Comments)    Twitching   . Aspirin  Other (See Comments)    REACTION: hx of hemorrhagic stroke  . Phenytoin Swelling    mostly in arms  . Sulfonamide Derivatives Itching    History  Substance Use Topics  . Smoking status: Never Smoker   . Smokeless tobacco: Not on file  . Alcohol Use: No    Family History  Problem Relation Age of Onset  . Stroke Mother   . Hypertension Mother   . Heart disease Father     CABG, MI  . Cancer Father     skin  . Diabetes Brother   . Heart disease Brother   . Hypertension Brother      Review of Systems  Constitutional: Negative for fever and chills.  HENT: Negative for ear pain and sore throat.   Eyes: Negative for blurred vision and double vision.  Respiratory: Negative for cough and shortness of breath.   Cardiovascular: Negative for chest pain and palpitations.  Gastrointestinal: Negative for nausea, vomiting and abdominal pain.  Musculoskeletal:       Right hip pain with ambulation  Skin: Negative for rash.  Neurological: Negative for dizziness and seizures.  Psychiatric/Behavioral: Negative for depression and suicidal ideas.    Objective:  Physical Exam  Constitutional: She is oriented to person, place, and time. She appears well-developed and well-nourished.  HENT:  Head: Normocephalic and atraumatic.  Eyes: Conjunctivae and EOM are normal. Pupils are equal, round, and reactive to light.  Neck: Normal range of motion. Neck supple.  Cardiovascular: Normal rate and intact distal pulses.   Respiratory: Effort  normal and breath sounds normal.  GI: Soft. Bowel sounds are normal.  Musculoskeletal:  Decreased ROM by 50% due to pain  Neurological: She is alert and oriented to person, place, and time.  Skin: Skin is warm and dry.  Psychiatric: She has a normal mood and affect. Her behavior is normal. Judgment and thought content normal.    Vital signs in last 24 hours:    Labs:   Estimated body mass index is 20.46 kg/(m^2) as calculated from the following:    Height as of 07/15/14: 5' 7.5" (1.715 m).   Weight as of 07/18/14: 60.192 kg (132 lb 11.2 oz).   Imaging Review Plain radiographs demonstrate severe degenerative joint disease of the right hip(s). The bone quality appears to be fair for age and reported activity level.  Assessment/Plan:  End stage arthritis, right hip(s)  The patient history, physical examination, clinical judgement of the provider and imaging studies are consistent with end stage degenerative joint disease of the right hip(s) and total hip arthroplasty is deemed medically necessary. The treatment options including medical management, injection therapy, arthroscopy and arthroplasty were discussed at length. The risks and benefits of total hip arthroplasty were presented and reviewed. The risks due to aseptic loosening, infection, stiffness, dislocation/subluxation,  thromboembolic complications and other imponderables were discussed.  The patient acknowledged the explanation, agreed to proceed with the plan and consent was signed. Patient is being admitted for inpatient treatment for surgery, pain control, PT, OT, prophylactic antibiotics, VTE prophylaxis, progressive ambulation and ADL's and discharge planning.The patient is planning to be discharged home with home health services

## 2014-08-22 NOTE — Pre-Procedure Instructions (Signed)
[name]  08/22/2014      [facility] - [place], [place] - [address] [place] [address] Phone: [phone] Fax: [phone]    Your procedure is scheduled on 09/03/2014 .  Report to [facility] Admitting at 10:00 A.M.   Call this number if you have problems the morning of surgery:   [phone]   Remember:  Do not eat food or drink liquids after midnight.  On   MONDAY  Take these medicines the morning of surgery with A SIP OF WATER:    carvedilol (COREG)   Do not wear jewelry, make-up or nail polish.   Do not wear lotions, powders, or perfumes.  You may wear deodorant.   Do not shave 48 hours prior to surgery.     Do not bring valuables to the hospital.   [facility] is not responsible for any belongings or valuables.  Contacts, dentures or bridgework may not be worn into surgery.  Leave your suitcase in the car.  After surgery it may be brought to your room.  For patients admitted to the hospital, discharge time will be determined by your treatment team.  Patients discharged the day of surgery will not be allowed to drive home.   Name and phone number of your driver:   Special instructions:  Special Instructions: [place] - Preparing for Surgery  Before surgery, you can play an important role.  Because skin is not sterile, your skin needs to be as free of germs as possible.  You can reduce the number of germs on you skin by washing with CHG (chlorahexidine gluconate) soap before surgery.  CHG is an antiseptic cleaner which kills germs and bonds with the skin to continue killing germs even after washing.  Please DO NOT use if you have an allergy to CHG or antibacterial soaps.  If your skin becomes reddened/irritated stop using the CHG and inform your nurse when you arrive at Short Stay.  Do not shave (including legs and underarms) for at least 48 hours prior to the first CHG shower.  You may shave your  face.  Please follow these instructions carefully:   1.  Shower with CHG Soap the night before surgery and the  morning of Surgery.  2.  If you choose to wash your hair, wash your hair first as usual with your  normal shampoo.  3.  After you shampoo, rinse your hair and body thoroughly to remove the  Shampoo.  4.  Use CHG as you would any other liquid soap.  You can apply chg directly to the skin and wash gently with scrungie or a clean washcloth.  5.  Apply the CHG Soap to your body ONLY FROM THE NECK DOWN.    Do not use on open wounds or open sores.  Avoid contact with your eyes, ears, mouth and genitals (private parts).  Wash genitals (private parts)   with your normal soap.  6.  Wash thoroughly, paying special attention to the area where your surgery will be performed.  7.  Thoroughly rinse your body with warm water from the neck down.  8.  DO NOT shower/wash with your normal soap after using and rinsing off   the CHG Soap.  9.  Pat yourself dry with a clean towel.            10.  Wear clean pajamas.  11.  Place clean sheets on your bed the night of your first shower and do not sleep with pets.  Day of Surgery  Do not apply any lotions/deodorants the morning of surgery.  Please wear clean clothes to the hospital/surgery center.  Please read over the following fact sheets that you were given. Pain Booklet, Coughing and Deep Breathing, Blood Transfusion Information, Total Joint Packet, MRSA Information and Surgical Site Infection Prevention

## 2014-08-23 ENCOUNTER — Other Ambulatory Visit (HOSPITAL_COMMUNITY): Payer: Self-pay

## 2014-08-23 ENCOUNTER — Encounter (HOSPITAL_COMMUNITY): Payer: Self-pay

## 2014-08-23 ENCOUNTER — Encounter (HOSPITAL_COMMUNITY)
Admission: RE | Admit: 2014-08-23 | Discharge: 2014-08-23 | Disposition: A | Discharge status: Home / Self Care | Payer: Medicare Other | Source: Ambulatory Visit | Attending: Orthopedic Surgery | Admitting: Orthopedic Surgery

## 2014-08-23 DIAGNOSIS — Z0183 Encounter for blood typing: Secondary | ICD-10-CM | POA: Diagnosis not present

## 2014-08-23 DIAGNOSIS — I10 Essential (primary) hypertension: Secondary | ICD-10-CM | POA: Insufficient documentation

## 2014-08-23 DIAGNOSIS — M1611 Unilateral primary osteoarthritis, right hip: Secondary | ICD-10-CM | POA: Insufficient documentation

## 2014-08-23 DIAGNOSIS — Z01812 Encounter for preprocedural laboratory examination: Secondary | ICD-10-CM | POA: Insufficient documentation

## 2014-08-23 DIAGNOSIS — Z01818 Encounter for other preprocedural examination: Secondary | ICD-10-CM | POA: Diagnosis not present

## 2014-08-23 DIAGNOSIS — Z8673 Personal history of transient ischemic attack (TIA), and cerebral infarction without residual deficits: Secondary | ICD-10-CM | POA: Diagnosis not present

## 2014-08-23 DIAGNOSIS — I481 Persistent atrial fibrillation: Secondary | ICD-10-CM | POA: Diagnosis not present

## 2014-08-23 DIAGNOSIS — Z79899 Other long term (current) drug therapy: Secondary | ICD-10-CM | POA: Diagnosis not present

## 2014-08-23 HISTORY — DX: Other complications of anesthesia, initial encounter: T88.59XA

## 2014-08-23 HISTORY — DX: Adverse effect of unspecified anesthetic, initial encounter: T41.45XA

## 2014-08-23 LAB — CBC
HCT: 43.9 % (ref 36.0–46.0)
Hemoglobin: 15.4 g/dL — ABNORMAL HIGH (ref 12.0–15.0)
MCH: 29.6 pg (ref 26.0–34.0)
MCHC: 35.1 g/dL (ref 30.0–36.0)
MCV: 84.3 fL (ref 78.0–100.0)
Platelets: 243 10*3/uL (ref 150–400)
RBC: 5.21 MIL/uL — ABNORMAL HIGH (ref 3.87–5.11)
RDW: 13.2 % (ref 11.5–15.5)
WBC: 6.1 10*3/uL (ref 4.0–10.5)

## 2014-08-23 LAB — BASIC METABOLIC PANEL
ANION GAP: 9 (ref 5–15)
BUN: 5 mg/dL — ABNORMAL LOW (ref 6–20)
CALCIUM: 9.3 mg/dL (ref 8.9–10.3)
CHLORIDE: 92 mmol/L — AB (ref 101–111)
CO2: 28 mmol/L (ref 22–32)
CREATININE: 0.61 mg/dL (ref 0.44–1.00)
GFR calc non Af Amer: >60 mL/min (ref >60)
GLUCOSE: 76 mg/dL (ref 65–99)
Potassium: 3.6 mmol/L (ref 3.5–5.1)
SODIUM: 129 mmol/L — AB (ref 135–145)

## 2014-08-23 LAB — SURGICAL PCR SCREEN
MRSA, PCR: NEGATIVE
STAPHYLOCOCCUS AUREUS: NEGATIVE

## 2014-08-23 LAB — URINALYSIS, ROUTINE W REFLEX MICROSCOPIC
Bilirubin Urine: NEGATIVE
Glucose, UA: NEGATIVE mg/dL
Hgb urine dipstick: NEGATIVE
Ketones, ur: NEGATIVE mg/dL
NITRITE: NEGATIVE
PH: 7 (ref 5.0–8.0)
Protein, ur: NEGATIVE mg/dL
SPECIFIC GRAVITY, URINE: 1.007 (ref 1.005–1.030)
UROBILINOGEN UA: 0.2 mg/dL (ref 0.0–1.0)

## 2014-08-23 LAB — PROTIME-INR
INR: 1.2 (ref 0.00–1.49)
PROTHROMBIN TIME: 15.4 s — AB (ref 11.6–15.2)

## 2014-08-23 LAB — TYPE AND SCREEN
ABO/RH(D): A POS
ANTIBODY SCREEN: NEGATIVE

## 2014-08-23 LAB — URINE MICROSCOPIC-ADD ON

## 2014-08-23 LAB — ABO/RH: ABO/RH(D): A POS

## 2014-08-23 MED ORDER — CHLORHEXIDINE GLUCONATE 4 % EX LIQD: 60.0000 mL | Freq: Once | CUTANEOUS | Status: DC

## 2014-08-25 LAB — URINE CULTURE: Colony Count: 50000

## 2014-08-26 NOTE — Progress Notes (Signed)
Anesthesia Chart Review:  Pt is 74 year old female scheduled for R total hip arthroplasty on 09/03/2014 with Dr. Alain Marion.   Cardiologist is Dr. Rayann Heman, last office visit 07/15/14.   PMH includes: persistent atrial fibrillation (refuses anticoagulation), stroke, HTN. Never smoker. BMI 21.   Medications include: ASA, carvdeilol, lisinopril-hctz.   Preoperative labs reviewed.  PT 15.4. E coli growth on urine culture.   EKG 07/15/2014: atrial fibrillation with premature aberrantly conducted complexes. Abnormal QRS-T angle, consider primary T wave abnormality. This is interpreted as nonspecific St/T wave changes by Dr. Rayann Heman.   Echo 07/08/14: - Left ventricle: The cavity size was normal. Systolic function was normal. The estimated ejection fraction was in the range of 50% to 55%. Wall motion was normal; there were no regional wall motion abnormalities. - Aortic valve: There was mild regurgitation. - Mitral valve: There was moderate regurgitation. - Left atrium: The atrium was mildly dilated. - Right atrium: The atrium was mildly dilated. - Pulmonary arteries: Systolic pressure was moderately increased. PA peak pressure: 49 mm Hg (S).  Nuclear stress test 11/28/2013: Low risk stress nuclear study. 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 LV Ejection Fraction: 40%. LV Wall Motion: Global hypokinesis, modertely depressed overall LV systolic function.  Pt has cardiac clearance from Dr. Rayann Heman for procedure. Can be found under media tab.   If no changes, I anticipate pt can proceed with surgery as scheduled.   Willeen Cass, FNP-BC [facility]/Anesthesiology Phone: [phone] 08/26/2014 2:22 PM

## 2014-09-02 MED ORDER — SODIUM CHLORIDE 0.9 % IV SOLN
2000.0000 mg | INTRAVENOUS | Status: DC
  Filled 2014-09-02: qty 20

## 2014-09-02 NOTE — Progress Notes (Signed)
Pt.notified of time change,to arrive at 1100.pt. Voices understanding.

## 2014-09-03 ENCOUNTER — Inpatient Hospital Stay (HOSPITAL_COMMUNITY): Payer: Medicare Other | Admitting: Emergency Medicine

## 2014-09-03 ENCOUNTER — Inpatient Hospital Stay (HOSPITAL_COMMUNITY): Payer: Medicare Other

## 2014-09-03 ENCOUNTER — Inpatient Hospital Stay (HOSPITAL_COMMUNITY): Payer: Medicare Other | Admitting: Anesthesiology

## 2014-09-03 ENCOUNTER — Inpatient Hospital Stay (HOSPITAL_COMMUNITY)
Admission: RE | Admit: 2014-09-03 | Discharge: 2014-09-05 | DRG: 469 | Disposition: A | Discharge status: Home Health | Payer: Medicare Other | Source: Ambulatory Visit | Attending: Orthopedic Surgery | Admitting: Orthopedic Surgery

## 2014-09-03 ENCOUNTER — Encounter (HOSPITAL_COMMUNITY): Payer: Self-pay | Admitting: *Deleted

## 2014-09-03 ENCOUNTER — Encounter (HOSPITAL_COMMUNITY)
Admission: RE | Disposition: A | Discharge status: Home Health | Payer: Self-pay | Source: Ambulatory Visit | Attending: Orthopedic Surgery

## 2014-09-03 DIAGNOSIS — E871 Hypo-osmolality and hyponatremia: Secondary | ICD-10-CM | POA: Diagnosis not present

## 2014-09-03 DIAGNOSIS — Z8673 Personal history of transient ischemic attack (TIA), and cerebral infarction without residual deficits: Secondary | ICD-10-CM | POA: Diagnosis not present

## 2014-09-03 DIAGNOSIS — I9581 Postprocedural hypotension: Secondary | ICD-10-CM | POA: Diagnosis not present

## 2014-09-03 DIAGNOSIS — J9601 Acute respiratory failure with hypoxia: Secondary | ICD-10-CM | POA: Diagnosis not present

## 2014-09-03 DIAGNOSIS — I4891 Unspecified atrial fibrillation: Secondary | ICD-10-CM | POA: Diagnosis not present

## 2014-09-03 DIAGNOSIS — J9811 Atelectasis: Secondary | ICD-10-CM

## 2014-09-03 DIAGNOSIS — I872 Venous insufficiency (chronic) (peripheral): Secondary | ICD-10-CM | POA: Diagnosis present

## 2014-09-03 DIAGNOSIS — Z86010 Personal history of colonic polyps: Secondary | ICD-10-CM | POA: Diagnosis not present

## 2014-09-03 DIAGNOSIS — L509 Urticaria, unspecified: Secondary | ICD-10-CM | POA: Diagnosis not present

## 2014-09-03 DIAGNOSIS — I9788 Other intraoperative complications of the circulatory system, not elsewhere classified: Secondary | ICD-10-CM | POA: Diagnosis not present

## 2014-09-03 DIAGNOSIS — Y838 Other surgical procedures as the cause of abnormal reaction of the patient, or of later complication, without mention of misadventure at the time of the procedure: Secondary | ICD-10-CM | POA: Diagnosis not present

## 2014-09-03 DIAGNOSIS — Z882 Allergy status to sulfonamides status: Secondary | ICD-10-CM

## 2014-09-03 DIAGNOSIS — T8059XA Anaphylactic reaction due to other serum, initial encounter: Secondary | ICD-10-CM | POA: Diagnosis not present

## 2014-09-03 DIAGNOSIS — L409 Psoriasis, unspecified: Secondary | ICD-10-CM | POA: Diagnosis present

## 2014-09-03 DIAGNOSIS — Z886 Allergy status to analgesic agent status: Secondary | ICD-10-CM | POA: Diagnosis not present

## 2014-09-03 DIAGNOSIS — M1610 Unilateral primary osteoarthritis, unspecified hip: Secondary | ICD-10-CM | POA: Diagnosis present

## 2014-09-03 DIAGNOSIS — I481 Persistent atrial fibrillation: Secondary | ICD-10-CM | POA: Diagnosis present

## 2014-09-03 DIAGNOSIS — E876 Hypokalemia: Secondary | ICD-10-CM | POA: Diagnosis not present

## 2014-09-03 DIAGNOSIS — T783XXD Angioneurotic edema, subsequent encounter: Secondary | ICD-10-CM | POA: Diagnosis not present

## 2014-09-03 DIAGNOSIS — I5022 Chronic systolic (congestive) heart failure: Secondary | ICD-10-CM | POA: Diagnosis present

## 2014-09-03 DIAGNOSIS — T783XXA Angioneurotic edema, initial encounter: Secondary | ICD-10-CM | POA: Diagnosis not present

## 2014-09-03 DIAGNOSIS — I10 Essential (primary) hypertension: Secondary | ICD-10-CM | POA: Diagnosis present

## 2014-09-03 DIAGNOSIS — T8059XD Anaphylactic reaction due to other serum, subsequent encounter: Secondary | ICD-10-CM | POA: Diagnosis not present

## 2014-09-03 DIAGNOSIS — Z888 Allergy status to other drugs, medicaments and biological substances status: Secondary | ICD-10-CM | POA: Diagnosis not present

## 2014-09-03 DIAGNOSIS — Z8249 Family history of ischemic heart disease and other diseases of the circulatory system: Secondary | ICD-10-CM

## 2014-09-03 DIAGNOSIS — K5790 Diverticulosis of intestine, part unspecified, without perforation or abscess without bleeding: Secondary | ICD-10-CM | POA: Diagnosis present

## 2014-09-03 DIAGNOSIS — Z9289 Personal history of other medical treatment: Secondary | ICD-10-CM

## 2014-09-03 DIAGNOSIS — Z85828 Personal history of other malignant neoplasm of skin: Secondary | ICD-10-CM | POA: Diagnosis not present

## 2014-09-03 DIAGNOSIS — Y92239 Unspecified place in hospital as the place of occurrence of the external cause: Secondary | ICD-10-CM

## 2014-09-03 DIAGNOSIS — Z823 Family history of stroke: Secondary | ICD-10-CM | POA: Diagnosis not present

## 2014-09-03 DIAGNOSIS — M1611 Unilateral primary osteoarthritis, right hip: Principal | ICD-10-CM | POA: Diagnosis present

## 2014-09-03 DIAGNOSIS — Z96649 Presence of unspecified artificial hip joint: Secondary | ICD-10-CM

## 2014-09-03 DIAGNOSIS — M1990 Unspecified osteoarthritis, unspecified site: Secondary | ICD-10-CM | POA: Diagnosis present

## 2014-09-03 DIAGNOSIS — J9600 Acute respiratory failure, unspecified whether with hypoxia or hypercapnia: Secondary | ICD-10-CM | POA: Diagnosis not present

## 2014-09-03 HISTORY — PX: TOTAL HIP ARTHROPLASTY: SHX124

## 2014-09-03 LAB — COMPREHENSIVE METABOLIC PANEL
ALK PHOS: 30 U/L — AB (ref 38–126)
ALT: 10 U/L — ABNORMAL LOW (ref 14–54)
ANION GAP: 4 — AB (ref 5–15)
AST: 19 U/L (ref 15–41)
Albumin: 2.3 g/dL — ABNORMAL LOW (ref 3.5–5.0)
BILIRUBIN TOTAL: 1.3 mg/dL — AB (ref 0.3–1.2)
CO2: 23 mmol/L (ref 22–32)
CREATININE: 0.47 mg/dL (ref 0.44–1.00)
Calcium: 7 mg/dL — ABNORMAL LOW (ref 8.9–10.3)
Chloride: 103 mmol/L (ref 101–111)
GFR calc Af Amer: >60 mL/min (ref >60)
GFR calc non Af Amer: >60 mL/min (ref >60)
Glucose, Bld: 151 mg/dL — ABNORMAL HIGH (ref 65–99)
POTASSIUM: 2.2 mmol/L — AB (ref 3.5–5.1)
Sodium: 130 mmol/L — ABNORMAL LOW (ref 135–145)
Total Protein: 4 g/dL — ABNORMAL LOW (ref 6.5–8.1)

## 2014-09-03 LAB — CBC WITH DIFFERENTIAL/PLATELET
Basophils Absolute: 0 10*3/uL (ref 0.0–0.1)
Basophils Relative: 0 % (ref 0–1)
Eosinophils Absolute: 0 10*3/uL (ref 0.0–0.7)
Eosinophils Relative: 0 % (ref 0–5)
HCT: 31.5 % — ABNORMAL LOW (ref 36.0–46.0)
HEMOGLOBIN: 10.8 g/dL — AB (ref 12.0–15.0)
LYMPHS PCT: 11 % — AB (ref 12–46)
Lymphs Abs: 1.2 10*3/uL (ref 0.7–4.0)
MCH: 28.1 pg (ref 26.0–34.0)
MCHC: 34.3 g/dL (ref 30.0–36.0)
MCV: 81.8 fL (ref 78.0–100.0)
MONOS PCT: 5 % (ref 3–12)
Monocytes Absolute: 0.5 10*3/uL (ref 0.1–1.0)
NEUTROS ABS: 8.9 10*3/uL — AB (ref 1.7–7.7)
Neutrophils Relative %: 84 % — ABNORMAL HIGH (ref 43–77)
Platelets: 181 10*3/uL (ref 150–400)
RBC: 3.85 MIL/uL — AB (ref 3.87–5.11)
RDW: 12.7 % (ref 11.5–15.5)
WBC: 10.6 10*3/uL — AB (ref 4.0–10.5)

## 2014-09-03 LAB — POCT I-STAT 4, (NA,K, GLUC, HGB,HCT)
GLUCOSE: 130 mg/dL — AB (ref 65–99)
HEMATOCRIT: 35 % — AB (ref 36.0–46.0)
HEMOGLOBIN: 11.9 g/dL — AB (ref 12.0–15.0)
Potassium: 2.9 mmol/L — ABNORMAL LOW (ref 3.5–5.1)
Sodium: 129 mmol/L — ABNORMAL LOW (ref 135–145)

## 2014-09-03 LAB — TROPONIN I: Troponin I: <0.03 ng/mL (ref <0.031)

## 2014-09-03 LAB — PHOSPHORUS: PHOSPHORUS: 1.7 mg/dL — AB (ref 2.5–4.6)

## 2014-09-03 LAB — MAGNESIUM: MAGNESIUM: 1 mg/dL — AB (ref 1.7–2.4)

## 2014-09-03 LAB — PROTIME-INR
INR: 1.68 — AB (ref 0.00–1.49)
PROTHROMBIN TIME: 19.8 s — AB (ref 11.6–15.2)

## 2014-09-03 LAB — GLUCOSE, CAPILLARY: GLUCOSE-CAPILLARY: 151 mg/dL — AB (ref 65–99)

## 2014-09-03 LAB — CORTISOL

## 2014-09-03 LAB — BRAIN NATRIURETIC PEPTIDE: B NATRIURETIC PEPTIDE 5: 190.2 pg/mL — AB (ref 0.0–100.0)

## 2014-09-03 LAB — MRSA PCR SCREENING: MRSA by PCR: NEGATIVE

## 2014-09-03 LAB — APTT: APTT: 35 s (ref 24–37)

## 2014-09-03 SURGERY — ARTHROPLASTY, HIP, TOTAL, ANTERIOR APPROACH
Anesthesia: General | Site: Hip | Laterality: Right

## 2014-09-03 MED ORDER — ASPIRIN 325 MG PO TABS: 325.0000 mg | ORAL_TABLET | Freq: Every day | ORAL | Status: DC

## 2014-09-03 MED ORDER — MEPERIDINE HCL 25 MG/ML IJ SOLN: 6.2500 mg | INTRAMUSCULAR | Status: DC | PRN

## 2014-09-03 MED ORDER — PHENOL 1.4 % MT LIQD: 1.0000 | OROMUCOSAL | Status: DC | PRN

## 2014-09-03 MED ORDER — LIDOCAINE HCL (CARDIAC) 20 MG/ML IV SOLN
INTRAVENOUS | Status: AC
  Filled 2014-09-03: qty 5

## 2014-09-03 MED ORDER — HYDROCHLOROTHIAZIDE 25 MG PO TABS
25.0000 mg | ORAL_TABLET | Freq: Every day | ORAL | Status: DC
  Filled 2014-09-03: qty 1

## 2014-09-03 MED ORDER — SODIUM CHLORIDE 0.9 % IV SOLN
INTRAVENOUS | Status: DC
  Administered 2014-09-03 – 2014-09-05 (×2): via INTRAVENOUS

## 2014-09-03 MED ORDER — METHOCARBAMOL 1000 MG/10ML IJ SOLN
500.0000 mg | Freq: Four times a day (QID) | INTRAVENOUS | Status: DC | PRN
  Filled 2014-09-03: qty 5

## 2014-09-03 MED ORDER — PHENYLEPHRINE HCL 10 MG/ML IJ SOLN
10.0000 mg | INTRAVENOUS | Status: DC | PRN
  Administered 2014-09-03: 10 ug/min via INTRAVENOUS

## 2014-09-03 MED ORDER — MIDAZOLAM HCL 5 MG/5ML IJ SOLN
INTRAMUSCULAR | Status: DC | PRN
  Administered 2014-09-03 (×2): 2 mg via INTRAVENOUS

## 2014-09-03 MED ORDER — HYDROMORPHONE HCL 1 MG/ML IJ SOLN: 0.2500 mg | INTRAMUSCULAR | Status: DC | PRN

## 2014-09-03 MED ORDER — FENTANYL CITRATE (PF) 100 MCG/2ML IJ SOLN
100.0000 ug | INTRAMUSCULAR | Status: DC | PRN
  Administered 2014-09-03 – 2014-09-04 (×4): 100 ug via INTRAVENOUS
  Filled 2014-09-03 (×4): qty 2

## 2014-09-03 MED ORDER — MENTHOL 3 MG MT LOZG: 1.0000 | LOZENGE | OROMUCOSAL | Status: DC | PRN

## 2014-09-03 MED ORDER — HYDROMORPHONE HCL 1 MG/ML IJ SOLN: 1.0000 mg | INTRAMUSCULAR | Status: DC | PRN

## 2014-09-03 MED ORDER — PROMETHAZINE HCL 25 MG/ML IJ SOLN
6.2500 mg | INTRAMUSCULAR | Status: DC | PRN
Start: 2014-09-03 — End: 2014-09-03

## 2014-09-03 MED ORDER — MIDAZOLAM HCL 2 MG/2ML IJ SOLN
INTRAMUSCULAR | Status: AC
  Filled 2014-09-03: qty 2

## 2014-09-03 MED ORDER — ONDANSETRON HCL 4 MG/2ML IJ SOLN
INTRAMUSCULAR | Status: AC
  Filled 2014-09-03: qty 2

## 2014-09-03 MED ORDER — DIPHENHYDRAMINE HCL 12.5 MG/5ML PO ELIX
12.5000 mg | ORAL_SOLUTION | ORAL | Status: DC | PRN
Start: 2014-09-03 — End: 2014-09-04
  Filled 2014-09-03: qty 10

## 2014-09-03 MED ORDER — PROPOFOL 10 MG/ML IV BOLUS
INTRAVENOUS | Status: DC | PRN
  Administered 2014-09-03: 140 mg via INTRAVENOUS
  Administered 2014-09-03: 120 mg via INTRAVENOUS

## 2014-09-03 MED ORDER — LISINOPRIL 20 MG PO TABS
20.0000 mg | ORAL_TABLET | Freq: Every day | ORAL | Status: DC
  Filled 2014-09-03: qty 1

## 2014-09-03 MED ORDER — NEOSTIGMINE METHYLSULFATE 10 MG/10ML IV SOLN
INTRAVENOUS | Status: AC
  Filled 2014-09-03: qty 1

## 2014-09-03 MED ORDER — FAMOTIDINE IN NACL 20-0.9 MG/50ML-% IV SOLN
20.0000 mg | Freq: Two times a day (BID) | INTRAVENOUS | Status: DC
  Administered 2014-09-04 (×2): 20 mg via INTRAVENOUS
  Filled 2014-09-03 (×3): qty 50

## 2014-09-03 MED ORDER — POTASSIUM CHLORIDE IN NACL 20-0.45 MEQ/L-% IV SOLN: INTRAVENOUS | Status: DC

## 2014-09-03 MED ORDER — POTASSIUM CHLORIDE IN NACL 20-0.45 MEQ/L-% IV SOLN
INTRAVENOUS | Status: DC
  Administered 2014-09-03 – 2014-09-04 (×2): via INTRAVENOUS
  Filled 2014-09-03 (×4): qty 1000

## 2014-09-03 MED ORDER — CEFAZOLIN SODIUM-DEXTROSE 2-3 GM-% IV SOLR
INTRAVENOUS | Status: AC
  Administered 2014-09-03: 2 g via INTRAVENOUS
  Filled 2014-09-03: qty 50

## 2014-09-03 MED ORDER — DOCUSATE SODIUM 100 MG PO CAPS
100.0000 mg | ORAL_CAPSULE | Freq: Two times a day (BID) | ORAL | Status: DC
Start: 2014-09-03 — End: 2014-09-05
  Filled 2014-09-03 (×5): qty 1

## 2014-09-03 MED ORDER — 0.9 % SODIUM CHLORIDE (POUR BTL) OPTIME
TOPICAL | Status: DC | PRN
  Administered 2014-09-03: 1000 mL

## 2014-09-03 MED ORDER — NEOSTIGMINE METHYLSULFATE 10 MG/10ML IV SOLN
INTRAVENOUS | Status: DC | PRN
  Administered 2014-09-03: 3 mg via INTRAVENOUS

## 2014-09-03 MED ORDER — ONDANSETRON HCL 4 MG/2ML IJ SOLN
4.0000 mg | Freq: Four times a day (QID) | INTRAMUSCULAR | Status: DC | PRN
  Administered 2014-09-04: 4 mg via INTRAVENOUS
  Filled 2014-09-03: qty 2

## 2014-09-03 MED ORDER — METOPROLOL TARTRATE 1 MG/ML IV SOLN
INTRAVENOUS | Status: AC
  Filled 2014-09-03: qty 5

## 2014-09-03 MED ORDER — FENTANYL CITRATE (PF) 250 MCG/5ML IJ SOLN
INTRAMUSCULAR | Status: AC
  Filled 2014-09-03: qty 5

## 2014-09-03 MED ORDER — ONDANSETRON HCL 4 MG/2ML IJ SOLN
INTRAMUSCULAR | Status: DC | PRN
  Administered 2014-09-03: 4 mg via INTRAVENOUS

## 2014-09-03 MED ORDER — SODIUM CHLORIDE 0.9 % IV BOLUS (SEPSIS)
1000.0000 mL | Freq: Once | INTRAVENOUS | Status: AC
  Administered 2014-09-03: 1000 mL via INTRAVENOUS

## 2014-09-03 MED ORDER — MIDAZOLAM HCL 2 MG/2ML IJ SOLN
1.0000 mg | INTRAMUSCULAR | Status: DC | PRN
  Administered 2014-09-03: 2 mg via INTRAVENOUS
  Filled 2014-09-03: qty 2

## 2014-09-03 MED ORDER — DEXAMETHASONE SODIUM PHOSPHATE 10 MG/ML IJ SOLN
10.0000 mg | Freq: Once | INTRAMUSCULAR | Status: AC
  Administered 2014-09-04: 10 mg via INTRAVENOUS
  Filled 2014-09-03: qty 1

## 2014-09-03 MED ORDER — ACETAMINOPHEN 650 MG RE SUPP: 650.0000 mg | Freq: Four times a day (QID) | RECTAL | Status: DC | PRN

## 2014-09-03 MED ORDER — EPINEPHRINE HCL 0.1 MG/ML IJ SOSY
PREFILLED_SYRINGE | INTRAMUSCULAR | Status: DC | PRN
  Administered 2014-09-03: 20 ug via INTRAVENOUS

## 2014-09-03 MED ORDER — GLYCOPYRROLATE 0.2 MG/ML IJ SOLN
INTRAMUSCULAR | Status: DC | PRN
  Administered 2014-09-03: 0.4 mg via INTRAVENOUS

## 2014-09-03 MED ORDER — ASPIRIN EC 325 MG PO TBEC
325.0000 mg | DELAYED_RELEASE_TABLET | Freq: Every day | ORAL | Status: DC
  Administered 2014-09-05: 325 mg via ORAL
  Filled 2014-09-03 (×3): qty 1

## 2014-09-03 MED ORDER — LACTATED RINGERS IV SOLN
INTRAVENOUS | Status: DC
  Administered 2014-09-03: 11:00:00 via INTRAVENOUS

## 2014-09-03 MED ORDER — CEFAZOLIN SODIUM-DEXTROSE 2-3 GM-% IV SOLR: 2.0000 g | INTRAVENOUS | Status: DC

## 2014-09-03 MED ORDER — METOCLOPRAMIDE HCL 5 MG/ML IJ SOLN
5.0000 mg | Freq: Three times a day (TID) | INTRAMUSCULAR | Status: DC | PRN
Start: 2014-09-03 — End: 2014-09-04
  Filled 2014-09-03: qty 2

## 2014-09-03 MED ORDER — HYDROCORTISONE NA SUCCINATE PF 100 MG IJ SOLR
INTRAMUSCULAR | Status: DC | PRN
  Administered 2014-09-03: 100 mg via INTRAVENOUS

## 2014-09-03 MED ORDER — ROCURONIUM BROMIDE 100 MG/10ML IV SOLN
INTRAVENOUS | Status: DC | PRN
  Administered 2014-09-03: 20 mg via INTRAVENOUS
  Administered 2014-09-03: 30 mg via INTRAVENOUS

## 2014-09-03 MED ORDER — METOCLOPRAMIDE HCL 10 MG PO TABS
5.0000 mg | ORAL_TABLET | Freq: Three times a day (TID) | ORAL | Status: DC | PRN
Start: 2014-09-03 — End: 2014-09-04

## 2014-09-03 MED ORDER — METHYLPREDNISOLONE SODIUM SUCC 40 MG IJ SOLR
40.0000 mg | Freq: Four times a day (QID) | INTRAMUSCULAR | Status: AC
  Administered 2014-09-03 – 2014-09-04 (×3): 40 mg via INTRAVENOUS
  Filled 2014-09-03 (×3): qty 1

## 2014-09-03 MED ORDER — FENTANYL CITRATE (PF) 100 MCG/2ML IJ SOLN
INTRAMUSCULAR | Status: DC | PRN
  Administered 2014-09-03: 100 ug via INTRAVENOUS
  Administered 2014-09-03: 50 ug via INTRAVENOUS
  Administered 2014-09-03 (×2): 100 ug via INTRAVENOUS

## 2014-09-03 MED ORDER — TRANEXAMIC ACID 1000 MG/10ML IV SOLN
2000.0000 mg | INTRAVENOUS | Status: DC | PRN
  Administered 2014-09-03: 2000 mg via TOPICAL

## 2014-09-03 MED ORDER — METOPROLOL TARTRATE 1 MG/ML IV SOLN
INTRAVENOUS | Status: DC | PRN
  Administered 2014-09-03: 2 mg via INTRAVENOUS
  Administered 2014-09-03: 1 mg via INTRAVENOUS

## 2014-09-03 MED ORDER — SUCCINYLCHOLINE CHLORIDE 20 MG/ML IJ SOLN
INTRAMUSCULAR | Status: DC | PRN
  Administered 2014-09-03: 120 mg via INTRAVENOUS

## 2014-09-03 MED ORDER — NOREPINEPHRINE BITARTRATE 1 MG/ML IV SOLN
2.0000 ug/min | INTRAVENOUS | Status: DC
  Administered 2014-09-03 – 2014-09-04 (×2): 5 ug/min via INTRAVENOUS
  Filled 2014-09-03: qty 4

## 2014-09-03 MED ORDER — CYCLOBENZAPRINE HCL 10 MG PO TABS: 10.0000 mg | ORAL_TABLET | Freq: Three times a day (TID) | ORAL | Status: DC | PRN

## 2014-09-03 MED ORDER — ACETAMINOPHEN 325 MG PO TABS: 650.0000 mg | ORAL_TABLET | Freq: Four times a day (QID) | ORAL | Status: DC | PRN

## 2014-09-03 MED ORDER — DOCUSATE SODIUM 100 MG PO CAPS
100.0000 mg | ORAL_CAPSULE | Freq: Two times a day (BID) | ORAL | Status: DC
Start: 2014-09-03 — End: 2015-05-14

## 2014-09-03 MED ORDER — HYDROCODONE-ACETAMINOPHEN 5-325 MG PO TABS
1.0000 | ORAL_TABLET | ORAL | Status: DC | PRN
  Administered 2014-09-04: 1 via ORAL
  Filled 2014-09-03: qty 1

## 2014-09-03 MED ORDER — ONDANSETRON HCL 4 MG PO TABS: 4.0000 mg | ORAL_TABLET | Freq: Four times a day (QID) | ORAL | Status: DC | PRN

## 2014-09-03 MED ORDER — DEXMEDETOMIDINE HCL IN NACL 200 MCG/50ML IV SOLN
INTRAVENOUS | Status: DC | PRN
  Administered 2014-09-03: .5 ug/kg/h via INTRAVENOUS

## 2014-09-03 MED ORDER — DIPHENHYDRAMINE HCL 50 MG/ML IJ SOLN
25.0000 mg | Freq: Four times a day (QID) | INTRAMUSCULAR | Status: DC
  Administered 2014-09-03 – 2014-09-04 (×3): 25 mg via INTRAVENOUS
  Filled 2014-09-03: qty 0.5
  Filled 2014-09-03 (×3): qty 1
  Filled 2014-09-03 (×3): qty 0.5

## 2014-09-03 MED ORDER — DEXTROSE 5 % IV SOLN
0.0000 ug/min | INTRAVENOUS | Status: DC
  Filled 2014-09-03: qty 16

## 2014-09-03 MED ORDER — LACTATED RINGERS IV SOLN
INTRAVENOUS | Status: DC | PRN
  Administered 2014-09-03 (×2): via INTRAVENOUS

## 2014-09-03 MED ORDER — DIPHENHYDRAMINE HCL 50 MG/ML IJ SOLN
INTRAMUSCULAR | Status: DC | PRN
  Administered 2014-09-03: 12.5 mg via INTRAVENOUS
  Administered 2014-09-03: 37.5 mg via INTRAVENOUS

## 2014-09-03 MED ORDER — POTASSIUM CHLORIDE 10 MEQ/100ML IV SOLN
10.0000 meq | INTRAVENOUS | Status: AC
  Administered 2014-09-03 – 2014-09-04 (×6): 10 meq via INTRAVENOUS
  Filled 2014-09-03 (×7): qty 100

## 2014-09-03 MED ORDER — ALBUMIN HUMAN 5 % IV SOLN
INTRAVENOUS | Status: DC | PRN
  Administered 2014-09-03: 14:00:00 via INTRAVENOUS

## 2014-09-03 MED ORDER — GLYCOPYRROLATE 0.2 MG/ML IJ SOLN
INTRAMUSCULAR | Status: AC
  Filled 2014-09-03: qty 1

## 2014-09-03 MED ORDER — CLINDAMYCIN PHOSPHATE 600 MG/50ML IV SOLN
600.0000 mg | Freq: Four times a day (QID) | INTRAVENOUS | Status: AC
  Administered 2014-09-03 – 2014-09-04 (×2): 600 mg via INTRAVENOUS
  Filled 2014-09-03 (×2): qty 50

## 2014-09-03 MED ORDER — ONDANSETRON HCL 4 MG PO TABS: 4.0000 mg | ORAL_TABLET | Freq: Three times a day (TID) | ORAL | Status: DC | PRN

## 2014-09-03 MED ORDER — MELOXICAM 15 MG PO TABS: 15.0000 mg | ORAL_TABLET | Freq: Every day | ORAL | Status: DC

## 2014-09-03 MED ORDER — DEXTROSE 5 % IV SOLN
0.0000 ug/min | INTRAVENOUS | Status: DC
  Filled 2014-09-03: qty 4

## 2014-09-03 MED ORDER — HYDROCODONE-ACETAMINOPHEN 5-325 MG PO TABS: 1.0000 | ORAL_TABLET | Freq: Four times a day (QID) | ORAL | Status: DC | PRN

## 2014-09-03 MED ORDER — ACETAMINOPHEN 500 MG PO TABS: 1000.0000 mg | ORAL_TABLET | Freq: Once | ORAL | Status: DC

## 2014-09-03 MED ORDER — CARVEDILOL 12.5 MG PO TABS
12.5000 mg | ORAL_TABLET | Freq: Two times a day (BID) | ORAL | Status: DC
  Filled 2014-09-03 (×3): qty 1

## 2014-09-03 MED ORDER — METHOCARBAMOL 500 MG PO TABS
500.0000 mg | ORAL_TABLET | Freq: Four times a day (QID) | ORAL | Status: DC | PRN
  Filled 2014-09-03: qty 1

## 2014-09-03 MED ORDER — LISINOPRIL-HYDROCHLOROTHIAZIDE 20-25 MG PO TABS: 1.0000 | ORAL_TABLET | Freq: Every day | ORAL | Status: DC

## 2014-09-03 SURGICAL SUPPLY — 64 items
BAG DECANTER FOR FLEXI CONT (MISCELLANEOUS) IMPLANT
BLADE SAGITTAL 25.0X1.19X90 (BLADE) ×2 IMPLANT
BLADE SAGITTAL 25.0X1.19X90MM (BLADE) ×1
BLADE SAW SGTL 18X1.27X75 (BLADE) IMPLANT
BLADE SAW SGTL 18X1.27X75MM (BLADE)
BLADE SURG ROTATE 9660 (MISCELLANEOUS) IMPLANT
CAPT HIP TOTAL 2 ×3 IMPLANT
CATH FOLEY LATEX FREE 14FR (CATHETERS) ×2
CATH FOLEY LF 14FR (CATHETERS) ×1 IMPLANT
COVER SURGICAL LIGHT HANDLE (MISCELLANEOUS) ×3 IMPLANT
DERMABOND ADHESIVE PROPEN (GAUZE/BANDAGES/DRESSINGS) ×2
DERMABOND ADVANCED .7 DNX6 (GAUZE/BANDAGES/DRESSINGS) ×1 IMPLANT
DRAPE C-ARM 42X72 X-RAY (DRAPES) IMPLANT
DRAPE IMP U-DRAPE 54X76 (DRAPES) ×3 IMPLANT
DRAPE INCISE IOBAN 66X45 STRL (DRAPES) ×3 IMPLANT
DRAPE ORTHO SPLIT 77X108 STRL (DRAPES) ×4
DRAPE SURG ORHT 6 SPLT 77X108 (DRAPES) ×2 IMPLANT
DRAPE U-SHAPE 47X51 STRL (DRAPES) ×3 IMPLANT
DRSG MEPILEX BORDER 4X8 (GAUZE/BANDAGES/DRESSINGS) ×3 IMPLANT
DURAPREP 26ML APPLICATOR (WOUND CARE) ×3 IMPLANT
ELECT BLADE 4.0 EZ CLEAN MEGAD (MISCELLANEOUS) ×3
ELECT REM PT RETURN 9FT ADLT (ELECTROSURGICAL) ×3
ELECTRODE BLDE 4.0 EZ CLN MEGD (MISCELLANEOUS) ×1 IMPLANT
ELECTRODE REM PT RTRN 9FT ADLT (ELECTROSURGICAL) ×1 IMPLANT
FACESHIELD WRAPAROUND (MASK) ×6 IMPLANT
GLOVE BIO SURGEON STRL SZ7 (GLOVE) ×3 IMPLANT
GLOVE BIO SURGEON STRL SZ7.5 (GLOVE) ×3 IMPLANT
GLOVE BIOGEL PI IND STRL 6.5 (GLOVE) ×3 IMPLANT
GLOVE BIOGEL PI IND STRL 7.0 (GLOVE) ×1 IMPLANT
GLOVE BIOGEL PI IND STRL 8 (GLOVE) ×1 IMPLANT
GLOVE BIOGEL PI INDICATOR 6.5 (GLOVE) ×6
GLOVE BIOGEL PI INDICATOR 7.0 (GLOVE) ×2
GLOVE BIOGEL PI INDICATOR 8 (GLOVE) ×2
GLOVE SURG SS PI 6.5 STRL IVOR (GLOVE) ×9 IMPLANT
GOWN STRL REUS W/ TWL LRG LVL3 (GOWN DISPOSABLE) ×2 IMPLANT
GOWN STRL REUS W/ TWL XL LVL3 (GOWN DISPOSABLE) ×2 IMPLANT
GOWN STRL REUS W/TWL LRG LVL3 (GOWN DISPOSABLE) ×4
GOWN STRL REUS W/TWL XL LVL3 (GOWN DISPOSABLE) ×4
KIT BASIN OR (CUSTOM PROCEDURE TRAY) ×3 IMPLANT
KIT ROOM TURNOVER OR (KITS) ×3 IMPLANT
MANIFOLD NEPTUNE II (INSTRUMENTS) ×3 IMPLANT
NDL SAFETY ECLIPSE 18X1.5 (NEEDLE) ×1 IMPLANT
NEEDLE 18GX1X1/2 (RX/OR ONLY) (NEEDLE) ×3 IMPLANT
NEEDLE HYPO 18GX1.5 SHARP (NEEDLE) ×2
NS IRRIG 1000ML POUR BTL (IV SOLUTION) ×3 IMPLANT
PACK TOTAL JOINT (CUSTOM PROCEDURE TRAY) ×3 IMPLANT
PACK UNIVERSAL I (CUSTOM PROCEDURE TRAY) ×3 IMPLANT
PAD ARMBOARD 7.5X6 YLW CONV (MISCELLANEOUS) ×3 IMPLANT
SPONGE LAP 18X18 X RAY DECT (DISPOSABLE) IMPLANT
SUCTION FRAZIER TIP 10 FR DISP (SUCTIONS) ×3 IMPLANT
SUT MNCRL AB 4-0 PS2 18 (SUTURE) ×3 IMPLANT
SUT MON AB 2-0 CT1 36 (SUTURE) ×3 IMPLANT
SUT VIC AB 0 CT1 27 (SUTURE) ×2
SUT VIC AB 0 CT1 27XBRD ANBCTR (SUTURE) ×1 IMPLANT
SUT VIC AB 1 CT1 27 (SUTURE) ×2
SUT VIC AB 1 CT1 27XBRD ANBCTR (SUTURE) ×1 IMPLANT
SYR 50ML LL SCALE MARK (SYRINGE) ×3 IMPLANT
SYRINGE 20CC LL (MISCELLANEOUS) IMPLANT
TOWEL OR 17X24 6PK STRL BLUE (TOWEL DISPOSABLE) ×3 IMPLANT
TOWEL OR 17X26 10 PK STRL BLUE (TOWEL DISPOSABLE) ×3 IMPLANT
TOWEL OR NON WOVEN STRL DISP B (DISPOSABLE) ×3 IMPLANT
TRAY FOLEY CATH 16FRSI W/METER (SET/KITS/TRAYS/PACK) IMPLANT
WATER STERILE IRR 1000ML POUR (IV SOLUTION) ×3 IMPLANT
YANKAUER SUCT BULB TIP NO VENT (SUCTIONS) ×3 IMPLANT

## 2014-09-03 NOTE — Discharge Instructions (Signed)

## 2014-09-03 NOTE — Interval H&P Note (Signed)
History and Physical Interval Note:  09/03/2014 8:08 AM  [place]  has presented today for surgery, with the diagnosis of DJD Right Hip  The various methods of treatment have been discussed with the patient and family. After consideration of risks, benefits and other options for treatment, the patient has consented to  Procedure(s): RIGHT TOTAL HIP ARTHROPLASTY ANTERIOR APPROACH (Right) as a surgical intervention .  The patient's history has been reviewed, patient examined, no change in status, stable for surgery.  I have reviewed the patient's chart and labs.  Questions were answered to the patient's satisfaction.     Elison Worrel D

## 2014-09-03 NOTE — Consult Note (Signed)
PULMONARY / CRITICAL CARE MEDICINE   Name: Kayla Wilson MRN: [id number] DOB: 1940/05/27    ADMISSION DATE:  09/03/2014 CONSULTATION DATE:  09/03/2014  REFERRING MD :  Dr. Percell Miller - Ortho  CHIEF COMPLAINT:  VDRF s/p hip arthoplasty.  INITIAL PRESENTATION: 74 year old female who presented for total R hip arthoplasty 6/14 under Dr. Percell Miller. Angioedema was noted to have developed intraoperatively and she remained on the ventilator. PCCM to assist with ICU care.  STUDIES:    SIGNIFICANT EVENTS: 6/14 - to OR for R hip arthoplasty > Intra-operative angioedema > to ICU on ventilator  HISTORY OF PRESENT ILLNESS:  74 year old female with PMH as below, which includes PAF, HTN, CVA, DJD, and complication of anesthesia presented to for Total R hip arthoplasty. Case was without surgical complication, however, during to procedure she develop systemic rash and angioedema. Of note she received ancef for surgical prophylaxis prior to the case. Post-operatively she remains on the vent, and after being recovered in PACU was transferred to ICU for further care. PCCM to assist with medical management.    PAST MEDICAL HISTORY :   has a past medical history of Atrial fibrillation; Hypertension; Stroke; Colon polyps; Diverticulosis; IATROGENIC CEREBROVASCULAR INFARCT/HEMORRHAGE NE (0/11/3233); and Complication of anesthesia.  has past surgical history that includes Appendectomy; Tubal ligation; and Eye surgery (Left). Prior to Admission medications   Medication Sig Start Date End Date Taking? Authorizing Provider  aspirin 81 MG tablet Take 81 mg by mouth daily.   Yes Historical Provider, MD  carvedilol (COREG) 12.5 MG tablet Take 1 tablet (12.5 mg total) by mouth 2 (two) times daily. 11/14/13  Yes Thompson Grayer, MD  lisinopril-hydrochlorothiazide (PRINZIDE,ZESTORETIC) 20-25 MG per tablet TAKE ONE TABLET BY MOUTH ONCE DAILY 05/30/14  Yes Kennyth Arnold, FNP  vitamin B-12 (CYANOCOBALAMIN) 500 MCG tablet Take 500 mcg by  mouth daily.   Yes Historical Provider, MD  acetaminophen (TYLENOL) 325 MG tablet Take 650 mg by mouth every 6 (six) hours as needed for mild pain.    Historical Provider, MD  aspirin 325 MG tablet Take 1 tablet (325 mg total) by mouth daily. 09/03/14   Brittney Claiborne Billings, PA-C  cyclobenzaprine (FLEXERIL) 10 MG tablet Take 1 tablet (10 mg total) by mouth 3 (three) times daily as needed for muscle spasms. 09/03/14   Brittney Claiborne Billings, PA-C  docusate sodium (COLACE) 100 MG capsule Take 1 capsule (100 mg total) by mouth 2 (two) times daily. 09/03/14   Brittney Claiborne Billings, PA-C  HYDROcodone-acetaminophen (NORCO) 5-325 MG per tablet Take 1-2 tablets by mouth every 6 (six) hours as needed for moderate pain. 09/03/14   Brittney Claiborne Billings, PA-C  meloxicam (MOBIC) 15 MG tablet Take 1 tablet (15 mg total) by mouth daily. 09/03/14   Brittney Claiborne Billings, PA-C  ondansetron (ZOFRAN) 4 MG tablet Take 1 tablet (4 mg total) by mouth every 8 (eight) hours as needed for nausea or vomiting. 09/03/14   Lovett Calender, PA-C   Allergies  Allergen Reactions  . Celebrex [Celecoxib] Itching and Other (See Comments)    Twitching   . Antihistamines, Chlorpheniramine-Type Other (See Comments)    afib  . Aspirin Other (See Comments)    REACTION: hx of hemorrhagic stroke  . Cortisone Other (See Comments)    "cortisone shoot made me jerk"  . Lidocaine Other (See Comments)    Pt was fine for ~2 days after she received medication and states she then got "sore all over and could not move"  . Phenytoin Swelling  mostly in arms  . Sulfonamide Derivatives Itching    FAMILY HISTORY:  indicated that both of her brothers are alive.  SOCIAL HISTORY:  reports that she has never smoked. She does not have any smokeless tobacco history on file. She reports that she does not drink alcohol or use illicit drugs.  REVIEW OF SYSTEMS:  Unattainable, patient is sedated and intubated.  SUBJECTIVE:   VITAL SIGNS: Temp:  [97.7 F (36.5 C)] 97.7 F (36.5 C)  (06/14 1059) Pulse Rate:  [60-63] 63 (06/14 1700) Resp:  [16-18] 16 (06/14 1700) BP: (142-146)/(86-90) 142/90 mmHg (06/14 1700) SpO2:  [100 %] 100 % (06/14 1700) FiO2 (%):  [60 %] 60 % (06/14 1700) Weight:  [59.421 kg (131 lb)] 59.421 kg (131 lb) (06/14 1047) HEMODYNAMICS:   VENTILATOR SETTINGS: Vent Mode:  [-]  FiO2 (%):  [60 %] 60 % INTAKE / OUTPUT:  Intake/Output Summary (Last 24 hours) at 09/03/14 1702 Last data filed at 09/03/14 1445  Gross per 24 hour  Intake   1500 ml  Output    300 ml  Net   1200 ml    PHYSICAL EXAMINATION: General:  Chronically ill appearing elderly female with diffuse rash in throughout the body. Neuro:  Sedated and intubated.  Remains paralyzed from intubation so unable to assess further. HEENT:  [place]/AT, PERRL, no EOM and MMM.  Severe angioedema of the tongue. Cardiovascular:  RRR, Nl S1/S2, -M/R/G. Lungs:  CTA bilaterally. Abdomen:  Soft, NT, ND and +BS. Musculoskeletal:  -edema and -tenderness. Skin:  Diffuse urticarial rash.  LABS:  CBC No results for input(s): WBC, HGB, HCT, PLT in the last 168 hours. Coag's No results for input(s): APTT, INR in the last 168 hours. BMET No results for input(s): NA, K, CL, CO2, BUN, CREATININE, GLUCOSE in the last 168 hours. Electrolytes No results for input(s): CALCIUM, MG, PHOS in the last 168 hours. Sepsis Markers No results for input(s): LATICACIDVEN, PROCALCITON, O2SATVEN in the last 168 hours. ABG No results for input(s): PHART, PCO2ART, PO2ART in the last 168 hours. Liver Enzymes No results for input(s): AST, ALT, ALKPHOS, BILITOT, ALBUMIN in the last 168 hours. Cardiac Enzymes No results for input(s): TROPONINI, PROBNP in the last 168 hours. Glucose No results for input(s): GLUCAP in the last 168 hours.  Imaging No results found.   ASSESSMENT / PLAN:  PULMONARY OETT 6/14>>> A: VDRF due to angioedema. P:   - Maintain on full vent support. - ABG and CXR now and in AM. - Adjust vent  for ABG. - Benadryl. - Pepcid. - Solumedrol.  CARDIOVASCULAR CVL L IJ TLC 6/14>>> A: Anaphylactic shock. ?intraop MI. P:  - EKG. - Troponin. - Norepi for BP support. - Hold home anti-HTN.  RENAL A:  No labs present. P:   - BMET now. - Replace electrolytes as indicated. - BMET in AM. - Follow CVP. - IVF hydration.  GASTROINTESTINAL A:  No active issues. P:   - Place OGT. - If remains intubated in AM then will start TF.  HEMATOLOGIC A:  No labs. P:  - CBC now and in AM. - Transfuse per ICU protocol.  INFECTIOUS A:  No active issues. P:   - Monitor WBC counts. - Monitor fever curve.  ENDOCRINE A:  No diabetes by history.   P:   - Monitor CBC. - On solumedrol as above.  NEUROLOGIC A:  Sedated and intubated. P:   RASS goal: -2 - PRN versed. - PRN fentanyl.  FAMILY  - Updates:  Family updated briefly but would like for the surgeon or anesthesia to speak with family as the complication occurred outside the ICU so I am unable to provide details.  The patient is critically ill with multiple organ systems failure and requires high complexity decision making for assessment and support, frequent evaluation and titration of therapies, application of advanced monitoring technologies and extensive interpretation of multiple databases.   Critical Care Time devoted to patient care services described in this note is  35  Minutes. This time reflects time of care of this signee Dr Jennet Maduro. This critical care time does not reflect procedure time, or teaching time or supervisory time of PA/NP/Med student/Med Resident etc but could involve care discussion time.  Rush Farmer, M.D. [facility] Pulmonary/Critical Care Medicine. Pager: [phone]. After hours pager: [phone].  09/03/2014, 5:02 PM

## 2014-09-03 NOTE — Anesthesia Preprocedure Evaluation (Signed)
Anesthesia Evaluation  Patient identified by MRN, date of birth, ID band Patient awake    Reviewed: Allergy & Precautions, NPO status , Patient's Chart, lab work & pertinent test results, reviewed documented beta blocker date and time   History of Anesthesia Complications Negative for: history of anesthetic complications  Airway Mallampati: II  TM Distance: >3 FB Neck ROM: Full    Dental no notable dental hx.    Pulmonary neg pulmonary ROS,  breath sounds clear to auscultation  Pulmonary exam normal       Cardiovascular hypertension, Pt. on medications and Pt. on home beta blockers + Peripheral Vascular Disease Normal cardiovascular exam+ dysrhythmias Atrial Fibrillation Rhythm:Regular Rate:Normal     Neuro/Psych CVA negative psych ROS   GI/Hepatic negative GI ROS, Neg liver ROS,   Endo/Other  negative endocrine ROS  Renal/GU negative Renal ROS     Musculoskeletal negative musculoskeletal ROS (+)   Abdominal   Peds  Hematology negative hematology ROS (+)   Anesthesia Other Findings   Reproductive/Obstetrics negative OB ROS                             Anesthesia Physical Anesthesia Plan  ASA: III  Anesthesia Plan:    Post-op Pain Management:    Induction:   Airway Management Planned:   Additional Equipment:   Intra-op Plan:   Post-operative Plan:   Informed Consent: I have reviewed the patients History and Physical, chart, labs and discussed the procedure including the risks, benefits and alternatives for the proposed anesthesia with the patient or authorized representative who has indicated his/her understanding and acceptance.   Dental advisory given  Plan Discussed with: CRNA  Anesthesia Plan Comments:         Anesthesia Quick Evaluation

## 2014-09-03 NOTE — Anesthesia Procedure Notes (Signed)
Procedure Name: Intubation Date/Time: 09/03/2014 1:06 PM Performed by: Izora Gala Pre-anesthesia Checklist: Patient identified, Emergency Drugs available, Suction available and Patient being monitored Patient Re-evaluated:Patient Re-evaluated prior to inductionOxygen Delivery Method: Circle system utilized Preoxygenation: Pre-oxygenation with 100% oxygen Intubation Type: IV induction Ventilation: Mask ventilation without difficulty Laryngoscope Size: Miller and 3 Grade View: Grade I Tube type: Oral Tube size: 7.0 mm Number of attempts: 1 Airway Equipment and Method: Stylet Placement Confirmation: ETT inserted through vocal cords under direct vision,  positive ETCO2 and breath sounds checked- equal and bilateral Secured at: 21 cm Tube secured with: Tape Dental Injury: Teeth and Oropharynx as per pre-operative assessment

## 2014-09-03 NOTE — Transfer of Care (Signed)
Immediate Anesthesia Transfer of Care Note  Patient: Kayla Wilson  Procedure(s) Performed: Procedure(s): RIGHT TOTAL HIP ARTHROPLASTY ANTERIOR APPROACH (Right)  Patient Location: ICU  Anesthesia Type:General  Level of Consciousness: Patient remains intubated per anesthesia plan  Airway & Oxygen Therapy: Patient remains intubated per anesthesia plan and Patient placed on Ventilator (see vital sign flow sheet for setting)  Post-op Assessment: Report given to RN  Post vital signs: Reviewed and stable  Last Vitals:  Filed Vitals:   09/03/14 1700  BP: 142/90  Pulse: 63  Temp:   Resp: 16    Complications: No apparent anesthesia complications

## 2014-09-03 NOTE — Progress Notes (Signed)
[place] Progress Note Patient Name: Kayla Wilson DOB: [birth year] MRN: [id number]   Date of Service  09/03/2014  HPI/Events of Note  K+ = 2.2 and Creatinine = 0.47.  eICU Interventions  Hypokalemia - repleted     Intervention Category Intermediate Interventions: Electrolyte abnormality - evaluation and management  Sommer,Steven Eugene 09/03/2014, 9:15 PM

## 2014-09-03 NOTE — Op Note (Signed)
09/03/2014  2:59 PM  PATIENT:  Kayla Wilson   MRN: [id number]  PRE-OPERATIVE DIAGNOSIS:  DJD Right Hip  POST-OPERATIVE DIAGNOSIS:  DJD Right Hip  PROCEDURE:  Procedure(s): RIGHT TOTAL HIP ARTHROPLASTY ANTERIOR APPROACH  PREOPERATIVE INDICATIONS:    Kayla Wilson is an 74 y.o. female who has a diagnosis of <principal problem not specified> and elected for surgical management after failing conservative treatment.  The risks benefits and alternatives were discussed with the patient including but not limited to the risks of nonoperative treatment, versus surgical intervention including infection, bleeding, nerve injury, periprosthetic fracture, the need for revision surgery, dislocation, leg length discrepancy, blood clots, cardiopulmonary complications, morbidity, mortality, among others, and they were willing to proceed.     OPERATIVE REPORT     SURGEON:   MURPHY, Ernesta Amble, MD    ASSISTANT:  Lovett Calender, PA-C, She was present and scrubbed throughout the case, critical for completion in a timely fashion, and for retraction, instrumentation, and closure.    ANESTHESIA:  General    COMPLICATIONS:  None.     COMPONENTS:  [employer]ker acolade fit femur size 5 with a 36 mm -2.5 head ball and a PSL acetabular shell size 54 with a  polyethylene liner    PROCEDURE IN DETAIL:   The patient was met in the holding area and  identified.  The appropriate hip was identified and marked at the operative site.  The patient was then transported to the OR  and  placed under general anesthesia.  At that point, the patient was  placed in the supine position and  secured to the operating room table and all bony prominences padded. He received pre-operative antibiotics    The operative lower extremity was prepped from the iliac crest to the distal leg.  Sterile draping was performed.  Time out was performed prior to incision.      Skin incision was made just 2 cm lateral to the ASIS  extending in line  with the tensor fascia lata. Electrocautery was used to control all bleeders. I dissected down sharply to the fascia of the tensor fascia lata was confirmed that the muscle fibers beneath were running posteriorly. I then incised the fascia over the superficial tensor fascia lata in line with the incision. The fascia was elevated off the anterior aspect of the muscle the muscle was retracted posteriorly and protected throughout the case. I then used electrocautery to incise the tensor fascia lata fascia control and all bleeders. Immediately visible was the fat over top of the anterior neck and capsule.  I removed the anterior fat from the capsule and elevated the rectus muscle off of the anterior capsule. I then removed a large time of capsule. The retractors were then placed over the anterior acetabulum as well as around the superior and inferior neck.  I then removed a section of the femoral neck and a napkin ring fashion. Then used the power course to remove the femoral head from the acetabulum and thoroughly irrigated the acetabulum. I sized the femoral head.    I then exposed the deep acetabulum, cleared out any tissue including the ligamentum teres.   After adequate visualization, I excised the labrum, and then sequentially reamed.  I placed the trial acetabulum, which seated nicely, and then impacted the real cup into place.  Appropriate version and inclination was confirmed clinically matching their bony anatomy, and also with the use transverse acetabular ligament.  I placed a 16 mm screw in the posterior/superio  position with an excellent bite.    I then placed the polyethylene liner in place  I then abducted the leg and released the external rotators from the posterior femur allowing it to be easily delivered up lateral and anterior to the acetabulum for preparation of the femoral canal.    I then prepared the proximal femur using the cookie-cutter and then sequentially reamed and  broached.  A trial broach, neck, and head was utilized, and I reduced the hip and it was found to have excellent stability with functional range of motion..  I then impacted the real femoral prosthesis into place into the appropriate version, slightly anteverted to the normal anatomy, and I impacted the real head ball into place. The hip was then reduced and taken through functional range of motion and found to have excellent stability. Leg lengths were restored.  I then irrigated the hip copiously again with, and repaired the fascia with Vicryl, followed by monocryl for the subcutaneous tissue, Monocryl for the skin, Steri-Strips and sterile gauze. The wounds were injected. The patient was then awakened and returned to PACU in stable and satisfactory condition. There were no complications.  POST OPERATIVE PLAN: WBAT, DVT px: SCD's/TED and ASA 325  Edmonia Lynch, MD Orthopedic Surgeon [phone]   This note was generated using a template and dragon dictation system. In light of that, I have reviewed the note and all aspects of it are applicable to this case. Any dictation errors are due to the computerized dictation system.

## 2014-09-03 NOTE — Procedures (Signed)
Central Venous Catheter Insertion Procedure Note Kayla Wilson [id number] March 10, 1941  Procedure: Insertion of Central Venous Catheter Indications: Assessment of intravascular volume, Drug and/or fluid administration and Frequent blood sampling  Procedure Details Consent: Risks of procedure as well as the alternatives and risks of each were explained to the (patient/caregiver).  Consent for procedure obtained. Time Out: Verified patient identification, verified procedure, site/side was marked, verified correct patient position, special equipment/implants available, medications/allergies/relevent history reviewed, required imaging and test results available.  Performed  Maximum sterile technique was used including antiseptics, cap, gloves, gown, hand hygiene, mask and sheet. Skin prep: Chlorhexidine; local anesthetic administered A antimicrobial bonded/coated triple lumen catheter was placed in the right internal jugular vein using the Seldinger technique.  Evaluation Blood flow good Complications: No apparent complications Patient did tolerate procedure well. Chest X-ray ordered to verify placement.  CXR: pending.  U/S used in placement.  Kayla Wilson 09/03/2014, 6:26 PM

## 2014-09-04 DIAGNOSIS — T783XXD Angioneurotic edema, subsequent encounter: Secondary | ICD-10-CM

## 2014-09-04 DIAGNOSIS — I4891 Unspecified atrial fibrillation: Secondary | ICD-10-CM

## 2014-09-04 DIAGNOSIS — J9600 Acute respiratory failure, unspecified whether with hypoxia or hypercapnia: Secondary | ICD-10-CM

## 2014-09-04 LAB — BASIC METABOLIC PANEL
ANION GAP: 7 (ref 5–15)
BUN: 7 mg/dL (ref 6–20)
CALCIUM: 7.2 mg/dL — AB (ref 8.9–10.3)
CHLORIDE: 97 mmol/L — AB (ref 101–111)
CO2: 19 mmol/L — AB (ref 22–32)
CREATININE: 0.7 mg/dL (ref 0.44–1.00)
GFR calc Af Amer: >60 mL/min (ref >60)
GFR calc non Af Amer: >60 mL/min (ref >60)
Glucose, Bld: 165 mg/dL — ABNORMAL HIGH (ref 65–99)
Potassium: 4.1 mmol/L (ref 3.5–5.1)
Sodium: 123 mmol/L — ABNORMAL LOW (ref 135–145)

## 2014-09-04 LAB — CBC
HEMATOCRIT: 32.5 % — AB (ref 36.0–46.0)
Hemoglobin: 11.6 g/dL — ABNORMAL LOW (ref 12.0–15.0)
MCH: 29 pg (ref 26.0–34.0)
MCHC: 35.7 g/dL (ref 30.0–36.0)
MCV: 81.3 fL (ref 78.0–100.0)
Platelets: 216 10*3/uL (ref 150–400)
RBC: 4 MIL/uL (ref 3.87–5.11)
RDW: 12.7 % (ref 11.5–15.5)
WBC: 9.6 10*3/uL (ref 4.0–10.5)

## 2014-09-04 LAB — MAGNESIUM: Magnesium: 1.1 mg/dL — ABNORMAL LOW (ref 1.7–2.4)

## 2014-09-04 LAB — PHOSPHORUS: PHOSPHORUS: 2.9 mg/dL (ref 2.5–4.6)

## 2014-09-04 LAB — TROPONIN I

## 2014-09-04 MED ORDER — CHLORHEXIDINE GLUCONATE 0.12 % MT SOLN
15.0000 mL | Freq: Two times a day (BID) | OROMUCOSAL | Status: DC
  Administered 2014-09-04: 15 mL via OROMUCOSAL
  Filled 2014-09-04: qty 15

## 2014-09-04 MED ORDER — IRBESARTAN 75 MG PO TABS: 75.0000 mg | ORAL_TABLET | Freq: Every day | ORAL | Status: DC

## 2014-09-04 MED ORDER — CETYLPYRIDINIUM CHLORIDE 0.05 % MT LIQD
7.0000 mL | Freq: Four times a day (QID) | OROMUCOSAL | Status: DC
  Administered 2014-09-04: 7 mL via OROMUCOSAL

## 2014-09-04 MED ORDER — METOPROLOL TARTRATE 1 MG/ML IV SOLN: 2.5000 mg | INTRAVENOUS | Status: DC | PRN

## 2014-09-04 MED ORDER — HYDROCHLOROTHIAZIDE 12.5 MG PO CAPS: 12.5000 mg | ORAL_CAPSULE | Freq: Every day | ORAL | Status: DC

## 2014-09-04 MED ORDER — RACEPINEPHRINE HCL 2.25 % IN NEBU
INHALATION_SOLUTION | RESPIRATORY_TRACT | Status: AC
  Filled 2014-09-04: qty 0.5

## 2014-09-04 MED ORDER — METOPROLOL TARTRATE 25 MG PO TABS
25.0000 mg | ORAL_TABLET | Freq: Four times a day (QID) | ORAL | Status: DC
  Administered 2014-09-04: 25 mg via ORAL
  Filled 2014-09-04 (×7): qty 1

## 2014-09-04 NOTE — Progress Notes (Signed)
OT Cancellation Note  Patient Details Name: Kayla Wilson MRN: [id number] DOB: [birth year]   Cancelled Treatment:    Reason Eval/Treat Not Completed: Other (comment). PT working with pt. Will try to come back as time allows.  Benito Mccreedy OTR/L [phone] 09/04/2014, 2:14 PM

## 2014-09-04 NOTE — Progress Notes (Signed)
PT Cancellation Note  Patient Details Name: Kayla Wilson MRN: [id number] DOB: 11-10-1940   Cancelled Treatment:    Reason Eval/Treat Not Completed: Medical issues which prohibited therapy Patient with bed rest order however, plans to be extubated today. Spoke with RN who will notify PT when patient is medically ready for comprehensive PT evaluation. I will follow up this afternoon.  Ellouise Newer 09/04/2014, 9:34 AM Elayne Snare, [place]

## 2014-09-04 NOTE — Progress Notes (Signed)
CRITICAL VALUE ALERT  Critical value received: Potassium 2.2  Date of notification:  09/03/14  Time of notification:  2041  Critical value read back:yes  Nurse who received alert:  Deboraha Sprang, RN  MD notified (1st page):  Dr. Oletta Darter  Time of first page 2055

## 2014-09-04 NOTE — Progress Notes (Signed)
Physical Therapy Evaluation Patient Details Name: REIGNA RUPERTO MRN: [id number] DOB: August 23, 1940 Today's Date: 09/04/2014   History of Present Illness  74 y.o. female s/p Rt total hip arthroplasty, had an allergic reaction to serum, anaphylactic shock, angioedema with VDRF, extubated 6/15.  Clinical Impression  Patient is s/p above procedure with the above complications presenting with functional limitations due to the deficits listed below (see PT Problem List). Functionally, doing quite well however, vitals were highly variable with short distance ambulation. HR elevated to upper 150s but would decrease to 120s, BP while sitting in recliner at end of therapy 58/34. Pt asymptomatic throughout and reports she feels well, with minor soreness of her Rt hip. RN in room at end of therapy session. Feel she will have adequate supervision from family at d/c. Will continue to follow and progress as medically able.        Follow Up Recommendations Home health PT;Supervision/Assistance - 24 hour    Equipment Recommendations  None recommended by PT    Recommendations for Other Services       Precautions / Restrictions Precautions Precautions: None Precaution Comments: monitor HR and BP Restrictions Weight Bearing Restrictions: Yes RLE Weight Bearing: Weight bearing as tolerated      Mobility  Bed Mobility Overal bed mobility: Needs Assistance Bed Mobility: Supine to Sit     Supine to sit: Min assist;HOB elevated     General bed mobility comments: min assist for patient to pull through PTs hand into seated position. VC for technique. Bed pad to scoot pt to edge of bed.  Transfers Overall transfer level: Needs assistance Equipment used: Rolling walker (2 wheeled) Transfers: Sit to/from Stand Sit to Stand: Min guard         General transfer comment: min guard for safety. VC for hand placement, slow to rise but stable once holding RW.  Ambulation/Gait Ambulation/Gait assistance:  Min assist Ambulation Distance (Feet): 75 Feet Assistive device: Rolling walker (2 wheeled) Gait Pattern/deviations: Step-through pattern;Decreased step length - left;Decreased stance time - right;Antalgic;Trunk flexed Gait velocity: decreased   General Gait Details: Educated on safe DME use with a rolling walker. Min assist for walker control in narrow spaces. VC for upright posture. No loss of balance, mildly antalgic. HR high variable reaching upper 150s at one point. Asymptomatic  Stairs            Wheelchair Mobility    Modified Rankin (Stroke Patients Only)       Balance Overall balance assessment: Needs assistance Sitting-balance support: No upper extremity supported;Feet supported Sitting balance-Leahy Scale: Fair     Standing balance support: Single extremity supported Standing balance-Leahy Scale: Poor                               Pertinent Vitals/Pain Pain Assessment: No/denies pain (reports some soreness post ambulation of Rt hip)    Home Living Family/patient expects to be discharged to:: Private residence Living Arrangements: Children;Spouse/significant other Available Help at Discharge: Family;Available 24 hours/day Type of Home: House Home Access: Ramped entrance     Home Layout: Multi-level;Able to live on main level with bedroom/bathroom Home Equipment: Walker - standard;Bedside commode (3 in 1)      Prior Function Level of Independence: Independent               Hand Dominance        Extremity/Trunk Assessment   Upper Extremity Assessment: Defer to OT evaluation  Lower Extremity Assessment: RLE deficits/detail RLE Deficits / Details: decreased strength and ROM as expected post op       Communication   Communication: No difficulties  Cognition Arousal/Alertness: Awake/alert Behavior During Therapy: WFL for tasks assessed/performed Overall Cognitive Status: Within Functional Limits for tasks  assessed                      General Comments General comments (skin integrity, edema, etc.): HR at rest 110 - highly variable with mobility reaching upper 150s at one point however pt asymptomatic throughout therapy session. BP also quite soft although denies any symptoms. Supine 80/44, sitting 91/72, Standing 78/54, sitting after ambulatory bout 58/34. SpO2 99% on room air.    Exercises Total Joint Exercises Ankle Circles/Pumps: AROM;Both;10 reps;Seated Quad Sets: Strengthening;Both;10 reps;Seated Long Arc Quad: Strengthening;Both;10 reps;Seated Marching in Standing: Strengthening;Both;10 reps;Seated      Assessment/Plan    PT Assessment Patient needs continued PT services  PT Diagnosis Difficulty walking;Abnormality of gait;Acute pain   PT Problem List Decreased strength;Decreased activity tolerance;Decreased range of motion;Decreased balance;Decreased mobility;Decreased knowledge of use of DME;Cardiopulmonary status limiting activity;Pain  PT Treatment Interventions DME instruction;Gait training;Functional mobility training;Therapeutic activities;Therapeutic exercise;Balance training;Neuromuscular re-education;Patient/family education;Modalities   PT Goals (Current goals can be found in the Care Plan section) Acute Rehab PT Goals Patient Stated Goal: go home soon PT Goal Formulation: With patient/family Time For Goal Achievement: 09/11/14 Potential to Achieve Goals: Good    Frequency 7X/week   Barriers to discharge        Co-evaluation               End of Session Equipment Utilized During Treatment: Gait belt Activity Tolerance: Patient tolerated treatment well Patient left: in chair;with call bell/phone within reach;with family/visitor present;with nursing/sitter in room Nurse Communication: Mobility status;Other (comment) (vitals during therapy)         Time: 5726-2035 PT Time Calculation (min) (ACUTE ONLY): 49 min   Charges:   PT  Evaluation $Initial PT Evaluation Tier I: 1 Procedure PT Treatments $Gait Training: 8-22 mins $Therapeutic Exercise: 8-22 mins   PT G Codes:        Ellouise Newer 09/04/2014, 4:11 PM Camille Bal [place], [place]

## 2014-09-04 NOTE — Consult Note (Signed)
CARDIOLOGY CONSULT NOTE   Patient ID: Kayla Wilson MRN: [id number], DOB/AGE: October 02, 1940   Admit date: 09/03/2014 Date of Consult: 09/04/2014   Primary Physician: Dorothyann Peng, NP Primary Cardiologist: Dr. Rayann Heman  Pt. 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 A-fib HR increased after holding home coreg during intubation. Cardiology consulted for rate control and ?anticoagulation  Problem List  Past Medical History  Diagnosis Date  . Atrial fibrillation   . Hypertension   . Stroke   . Colon polyps   . Diverticulosis   . IATROGENIC CEREBROVASCULAR INFARCT/HEMORRHAGE NE 03/22/1993    Qualifier: Diagnosis of  By: Leanne Chang MD, [place] per pt's report-Dr Vertell Limber- hemorrhagic stroke. No intervention required    . Complication of anesthesia     difficulty awakening    Past Surgical History  Procedure Laterality Date  . Appendectomy    . Tubal ligation    . Eye surgery Left     vitrectomy     Allergies  Allergies  Allergen Reactions  . Celebrex [Celecoxib] Itching and Other (See Comments)    Twitching   . Antihistamines, Chlorpheniramine-Type Other (See Comments)    afib  . Aspirin Other (See Comments)    REACTION: hx of hemorrhagic stroke  . Cortisone Other (See Comments)    "cortisone shoot made me jerk"  . Lidocaine Other (See Comments)    Pt was fine for ~2 days after she received medication and states she then got "sore all over and could not move"  . Phenytoin Swelling    mostly in arms  . 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 She has been adament about not taking systemic anticoagulation despite having CHA2DS-VASC score of at least 3 as her mother was on  coumadin and had labile INR and ultimately had stroke. She had a physical in 2015 which noted her HR to be irregular. Echo in July 2015 showed EF 45%. She also had a Myoview in Sep 2015 which was low risk. She has since been taking coreg along with lisinopril/HCTZ. She had a repeat echo in Apr 2016 at which time her EF was 50-55%, no RWMA, PA peak pressure 49, moderate MR. She saw Dr. Rayann Heman in Apr 2016 during which visit Dr. Rayann Heman discussed with her regarding Eliquis which has lower bleeding risk than coumadin, however patient refused.  She presented for outpatient R total hip arthroplasty on 09/03/2014 and had angioedema and respiratory distress during the procedure. She remained intubated until the morning of 6/15. Per telemetry, her HR was initially in the 60s, however has since increase to 120s. It does not appear she has received her coreg. Pulm critical recommend avoid ACEI for now and started her on ARB. Her BP has been soft on BP cuff, however better on arterial line. Cardiology consulted on anticoagulation and rate control.    Inpatient Medications  . aspirin EC  325 mg Oral Q breakfast  . carvedilol  12.5 mg Oral BID  . docusate sodium  100 mg Oral BID  . [START ON 09/05/2014] hydrochlorothiazide  12.5 mg Oral Daily  . [START ON 09/05/2014] irbesartan  75 mg Oral Daily  Family History Family History  Problem Relation Age of Onset  . Stroke Mother   . Hypertension Mother   . Heart disease Father     CABG, MI  . Cancer Father     skin  . Diabetes Brother   . Heart disease Brother   . Hypertension Brother      Social History History   Social History  . Marital Status: Married    Spouse Name: N/A  . Number of Children: N/A  . Years of Education: N/A   Occupational History  . Not on file.   Social History Main Topics  . Smoking status: Never Smoker   . Smokeless tobacco: Not on file  . Alcohol Use: No  . Drug Use: No  . Sexual Activity: Not on file   Other Topics  Concern  . Not on file   Social History Narrative   Married 39 years    - Seven children 6 girls and one boy.    - three children live in [place] live in [place].       11 grand children and 7 great grandchildren      Worked at [employer] for four years and then started having children       Diet: Fast food and cooks at home      Likes to [employer], flowers and working in the yard, Firefighter.               Review of Systems  General:  No chills, fever, night sweats or weight changes.  Cardiovascular:  No chest pain, dyspnea on exertion, edema, orthopnea, palpitations, paroxysmal nocturnal dyspnea. Dermatological: No rash, lesions/masses Respiratory: No cough, dyspnea Urologic: No hematuria, dysuria Abdominal:   No nausea, vomiting, diarrhea, bright red blood per rectum, melena, or hematemesis Neurologic:  No visual changes, wkns, changes in mental status. R hip pain 1-2/10 All other systems reviewed and are otherwise negative except as noted above.  Physical Exam  Blood pressure 76/35, pulse 106, temperature 98.4 F (36.9 C), temperature source Oral, resp. rate 17, height 5\' 6"  (1.676 m), weight 141 lb 8.6 oz (64.2 kg), SpO2 97 %.  General: Pleasant, NAD Psych: Normal affect. Neuro: Alert and oriented X 3. Moves all extremities spontaneously. HEENT: Normal  Neck: Supple without bruits or JVD. Lungs:  Resp regular and unlabored, CTA. Heart: RRR no s3, s4, or murmurs. Abdomen: Soft, non-tender, non-distended, BS + x 4.  Extremities: No clubbing, cyanosis or edema. DP/PT/Radials 2+ and equal bilaterally.  Labs   Recent Labs  09/03/14 1942 09/03/14 2324 09/04/14 0604  TROPONINI <0.03 <0.03 <0.03   Lab Results  Component Value Date   WBC 9.6 09/04/2014   HGB 11.6* 09/04/2014   HCT 32.5* 09/04/2014   MCV 81.3 09/04/2014   PLT 216 09/04/2014    Recent Labs Lab 09/03/14 1942 09/04/14 0604  NA 130* 123*  K 2.2* 4.1  CL  103 97*  CO2 23 19*  BUN <5* 7  CREATININE 0.47 0.70  CALCIUM 7.0* 7.2*  PROT 4.0*  --   BILITOT 1.3*  --   ALKPHOS 30*  --   ALT 10*  --   AST 19  --   GLUCOSE 151* 165*   Lab Results  Component Value Date   CHOL 225* 09/26/2013   HDL 73.50 09/26/2013   LDLCALC 137* 09/26/2013   TRIG 74.0 09/26/2013   No results found for: DDIMER  Radiology/Studies  Dg Pelvis Portable  09/03/2014   CLINICAL DATA:  Status post total hip replacement  EXAM: PORTABLE PELVIS 1-2 VIEWS  COMPARISON:  None.  FINDINGS: There is a total hip prosthesis on the right which appears well-seated. No acute fracture or dislocation. There is moderate osteoarthritic change in the left hip joint.  IMPRESSION: Right total hip prosthetic components appear well seated. No acute fracture or dislocation. Moderate osteoarthritic change left hip joint.   Electronically Signed   By: Lowella Grip III M.D.   On: 09/03/2014 19:10   Dg Chest Port 1 View  09/03/2014   CLINICAL DATA:  Hypoxia  EXAM: PORTABLE CHEST - 1 VIEW  COMPARISON:  November 28, 2007  FINDINGS: Endotracheal tube tip is 6.5 cm above the carina. Central catheter tip is in the superior vena cava. No pneumothorax. There is mild apical pleural thickening bilaterally, stable. No edema or consolidation. Heart size and pulmonary vascularity are normal. No adenopathy.  IMPRESSION: Tube and catheter positions as described without pneumothorax. No edema or consolidation.   Electronically Signed   By: Lowella Grip III M.D.   On: 09/03/2014 19:08    ECG  No new EKG  ASSESSMENT AND PLAN  1. Persistent atrial fibrillation: discussed with MD  - continue ASA if ok with surgery. Discussed with patient again, she is adament to not take any [place]  - no IV heparin  - discussed with Dr. Aundra Dubin, will do rate control only, start metoprolol 25 q6hr. D/C coreg and ARB and HCTZ as her EF is now normal.    2. HTN 3. 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 chronic systolic  HF with improved EF: EF now normal, euvolemic on exam  Signed, Almyra Deforest, PA-C 09/04/2014, 4:55 PM  Patient seen with PA, agree with the above note.  Patient developed angioedema during hip operation and was intubated.  She has been extubated.  She has chronic atrial fibrillation but has had low blood pressure and atrial fibrillation with RVR today.  Cardiology asked to evaluate.   Currently, SBP in 100s-110s by arterial line.  HR in 100s-110s.  She is comfortable.  She has not received her home Coreg, which is likely playing a role in her tachycardia.  - Would start metoprolol 25 mg every 6 hours.  If she tolerates this, can go back on her home Coreg.   CVP read as elevated earlier in day.  I checked myself and CVP normal (5).   No more ACEI with angioedema episode.  Likely safe to use ARB in the future.  Would hold off for now with soft BP.    She has had chronic atrial fibrillation and is not willing to be anticoagulated.  Extensive past discussions about this.   Loralie Champagne 09/04/2014 5:27 PM

## 2014-09-04 NOTE — Procedures (Signed)
Extubation Procedure Note  Patient Details:   Name: Kayla Wilson DOB: 08/20/1940 MRN: [id number]   Airway Documentation: Patient had audible cuff leak prior to extubation. Placed on 35% AFM sats are 98% able to speak full name and has a strong cough. Will continue to monitor.    Evaluation  O2 sats: stable throughout Complications: No apparent complications Patient did tolerate procedure well. Bilateral Breath Sounds: Clear Suctioning: Oral, Airway Yes  Yuliana Vandrunen D Sherree Shankman 09/04/2014, 11:01 AM

## 2014-09-04 NOTE — Progress Notes (Signed)
Passed SBT. Extubated. Toerlating  Filed Vitals:   09/04/14 1300 09/04/14 1330 09/04/14 1400 09/04/14 1430  BP: 82/61 85/57 91/72  59/26  Pulse: 68 72 148 127  Temp:      TempSrc:      Resp: 19 19 22 25   Height:      Weight:      SpO2: 100% 100% 95% 98%   RASS 0, + F/C HEENT WNL No JVD noted Chest clear IRIR, tachy, no M NABS Ext warm, no edema  BMET    Component Value Date/Time   NA 123* 09/04/2014 0604   K 4.1 09/04/2014 0604   CL 97* 09/04/2014 0604   CO2 19* 09/04/2014 0604   GLUCOSE 165* 09/04/2014 0604   BUN 7 09/04/2014 0604   CREATININE 0.70 09/04/2014 0604   CALCIUM 7.2* 09/04/2014 0604   GFRNONAA >60 09/04/2014 0604   GFRAA >60 09/04/2014 0604     CBC    Component Value Date/Time   WBC 9.6 09/04/2014 0604   RBC 4.00 09/04/2014 0604   HGB 11.6* 09/04/2014 0604   HCT 32.5* 09/04/2014 0604   PLT 216 09/04/2014 0604   MCV 81.3 09/04/2014 0604   MCH 29.0 09/04/2014 0604   MCHC 35.7 09/04/2014 0604   RDW 12.7 09/04/2014 0604   LYMPHSABS 1.2 09/03/2014 1942   MONOABS 0.5 09/03/2014 1942   EOSABS 0.0 09/03/2014 1942   BASOSABS 0.0 09/03/2014 1942    CXR: NNF  IMPRESSION: S/P R THR VDRF - left intubated due to concern for airway edema Cuff leak present Now extubated and tolerating CAF, now with RVR - Followed by Dr Rayann Heman  Not chronically anticoagulated Hypotension, resolved Hyponatremia  PLAN/REC: Monitor in ICU post extubation Supp O2 as needed Would avoid further ACEI due to concern for angioedema  ARB ordered to begin 6/16 Cards consult to address rate control and issue of anticoagulation PRN metoprolol to maintain HR < 115/min Resume carvedilol ordered for AM 6/16 Recheck BMET in AM 6/16 If tolerates extubation without sequelae, may be extubated 6/16  Merton Border, MD ; [facility] service Mobile [phone].  After 5:30 PM or weekends, call [phone]

## 2014-09-04 NOTE — Progress Notes (Signed)
     Subjective:  POD#1 RATH arthroplasty. Patient reports pain as mild.  Intubated but feeling much better than last night.  Able to communicate through writing.   Objective:   VITALS:   Filed Vitals:   09/04/14 0600 09/04/14 0615 09/04/14 0630 09/04/14 0751  BP:      Pulse: 100 135 96   Temp:    99.3 F (37.4 C)  TempSrc:    Oral  Resp: 16 16 16    Height:      Weight:      SpO2: 100% 100% 100%    Intubated Neurologically intact ABD soft Neurovascular intact Sensation intact distally Intact pulses distally Dorsiflexion/Plantar flexion intact Incision: dressing C/D/I   Lab Results  Component Value Date   WBC 9.6 09/04/2014   HGB 11.6* 09/04/2014   HCT 32.5* 09/04/2014   MCV 81.3 09/04/2014   PLT 216 09/04/2014   BMET    Component Value Date/Time   NA 123* 09/04/2014 0604   K 4.1 09/04/2014 0604   CL 97* 09/04/2014 0604   CO2 19* 09/04/2014 0604   GLUCOSE 165* 09/04/2014 0604   BUN 7 09/04/2014 0604   CREATININE 0.70 09/04/2014 0604   CALCIUM 7.2* 09/04/2014 0604   GFRNONAA >60 09/04/2014 0604   GFRAA >60 09/04/2014 0604     Assessment/Plan: 1 Day Post-Op   Active Problems:   Acute respiratory failure with hypoxemia   Angioedema   Urticaria   Allergic reaction to serum, anaphylactic shock   DJD (degenerative joint disease)   Up with therapy WBAT in the RLE SCD's and ASA 325mg  daily for DVT prophylaxis Requesting to go home today, but will likely need another day of observation per critical care team.  Will have her work with PT once no longer intubated.    Kayla Wilson Marie 09/04/2014, 8:16 AM Cell [phone]

## 2014-09-05 ENCOUNTER — Inpatient Hospital Stay (HOSPITAL_COMMUNITY): Payer: Medicare Other

## 2014-09-05 DIAGNOSIS — T8059XD Anaphylactic reaction due to other serum, subsequent encounter: Secondary | ICD-10-CM

## 2014-09-05 DIAGNOSIS — M1610 Unilateral primary osteoarthritis, unspecified hip: Secondary | ICD-10-CM | POA: Diagnosis present

## 2014-09-05 LAB — BASIC METABOLIC PANEL
Anion gap: 4 — ABNORMAL LOW (ref 5–15)
BUN: 9 mg/dL (ref 6–20)
CO2: 22 mmol/L (ref 22–32)
Calcium: 7.5 mg/dL — ABNORMAL LOW (ref 8.9–10.3)
Chloride: 101 mmol/L (ref 101–111)
Creatinine, Ser: 0.56 mg/dL (ref 0.44–1.00)
GLUCOSE: 135 mg/dL — AB (ref 65–99)
Potassium: 3.8 mmol/L (ref 3.5–5.1)
Sodium: 127 mmol/L — ABNORMAL LOW (ref 135–145)

## 2014-09-05 LAB — CBC
HEMATOCRIT: 25.2 % — AB (ref 36.0–46.0)
Hemoglobin: 8.9 g/dL — ABNORMAL LOW (ref 12.0–15.0)
MCH: 28.7 pg (ref 26.0–34.0)
MCHC: 35.3 g/dL (ref 30.0–36.0)
MCV: 81.3 fL (ref 78.0–100.0)
Platelets: 149 10*3/uL — ABNORMAL LOW (ref 150–400)
RBC: 3.1 MIL/uL — AB (ref 3.87–5.11)
RDW: 13 % (ref 11.5–15.5)
WBC: 12.1 10*3/uL — AB (ref 4.0–10.5)

## 2014-09-05 MED ORDER — SODIUM CHLORIDE 0.9 % IV BOLUS (SEPSIS)
500.0000 mL | Freq: Once | INTRAVENOUS | Status: AC
  Administered 2014-09-05: 500 mL via INTRAVENOUS

## 2014-09-05 MED ORDER — EPINEPHRINE 0.3 MG/0.3ML IJ SOAJ
0.3000 mg | Freq: Once | INTRAMUSCULAR | Status: DC
Start: 2014-09-05 — End: 2017-11-04

## 2014-09-05 MED ORDER — EPINEPHRINE 0.3 MG/0.3ML IJ SOAJ: 0.3000 mg | Freq: Once | INTRAMUSCULAR | Status: DC

## 2014-09-05 NOTE — Anesthesia Postprocedure Evaluation (Signed)
Anesthesia Post Note  Patient: Kayla Wilson  Procedure(s) Performed: Procedure(s) (LRB): RIGHT TOTAL HIP ARTHROPLASTY ANTERIOR APPROACH (Right)  Anesthesia type: General  Patient location: PACU  Post pain: Pain level controlled  Post assessment: Post-op Vital signs reviewed  Last Vitals: BP 101/60 mmHg  Pulse 85  Temp(Src) 36.3 C (Axillary)  Resp 12  Ht 5\' 6"  (1.676 m)  Wt 142 lb 10.2 oz (64.7 kg)  BMI 23.03 kg/m2  SpO2 100%  Post vital signs: Reviewed  Level of consciousness: sedated  Complications: Patient had, presumably, perioperative angioedema with oral and lingual edema requiring reintubation in the OR for airway protection.  No apparent anesthesia complications.

## 2014-09-05 NOTE — Progress Notes (Signed)
Passed SBT. Extubated. Toerlating  Filed Vitals:   09/05/14 0730 09/05/14 0800 09/05/14 0835 09/05/14 1000  BP: 98/56 113/66  126/75  Pulse: 60     Temp:   97.5 F (36.4 C)   TempSrc:   Axillary   Resp: 20 15    Height:      Weight:      SpO2: 100% 100%     General: RASS 0, + F/C Head: [place]/AT EENT: Supple, PERRL, EOM-I and MMM, no JVD noted Lung: Chest bilaterally. Cards: Katrine Coho, no M GI: Soft, NT, ND and +BS Ext warm, no edema and -tenderness.  BMET    Component Value Date/Time   NA 127* 09/05/2014 0420   K 3.8 09/05/2014 0420   CL 101 09/05/2014 0420   CO2 22 09/05/2014 0420   GLUCOSE 135* 09/05/2014 0420   BUN 9 09/05/2014 0420   CREATININE 0.56 09/05/2014 0420   CALCIUM 7.5* 09/05/2014 0420   GFRNONAA >60 09/05/2014 0420   GFRAA >60 09/05/2014 0420   CBC    Component Value Date/Time   WBC 12.1* 09/05/2014 0420   RBC 3.10* 09/05/2014 0420   HGB 8.9* 09/05/2014 0420   HCT 25.2* 09/05/2014 0420   PLT 149* 09/05/2014 0420   MCV 81.3 09/05/2014 0420   MCH 28.7 09/05/2014 0420   MCHC 35.3 09/05/2014 0420   RDW 13.0 09/05/2014 0420   LYMPHSABS 1.2 09/03/2014 1942   MONOABS 0.5 09/03/2014 1942   EOSABS 0.0 09/03/2014 1942   BASOSABS 0.0 09/03/2014 1942   CXR I reviewed myself without acute disease  IMPRESSION: S/P R THR VDRF - left intubated due to concern for airway edema Cuff leak present Now extubated and tolerating CAF, now with RVR - Followed by Dr Rayann Heman  Not chronically anticoagulated Hypotension, resolved Hyponatremia  PLAN/REC: Patient stable, may be discharge, discussed with ortho. Supp O2 as needed Would avoid further ACEI due to concern for angioedema ARB ordered to begin 6/16 Cards to address rate control and issue of anticoagulation as well as f/u. PRN metoprolol to maintain HR < 115/min Resume carvedilol ordered for AM 6/16 Recheck BMET in AM 6/16 May D/C, PCCM will sign off.  Rush Farmer, M.D. [facility] Pulmonary/Critical  Care Medicine. Pager: [phone]. After hours pager: [phone].

## 2014-09-05 NOTE — Discharge Summary (Signed)
Physician Discharge Summary  Patient ID: HARPER VANDERVOORT MRN: [id number] DOB/AGE: 22-Nov-1940 74 y.o.  Admit date: 09/03/2014 Discharge date: 09/05/2014  Admission Diagnoses:  Primary osteoarthritis of hip  Discharge Diagnoses:  Principal Problem:   Primary osteoarthritis of hip Active Problems:   Acute respiratory failure with hypoxemia   Angioedema   Urticaria   Allergic reaction to serum, anaphylactic shock   DJD (degenerative joint disease)   Past Medical History  Diagnosis Date  . Atrial fibrillation   . Hypertension   . Stroke   . Colon polyps   . Diverticulosis   . 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 No intervention required    . Complication of anesthesia     difficulty awakening    Surgeries: Procedure(s): RIGHT TOTAL HIP ARTHROPLASTY ANTERIOR APPROACH on 09/03/2014   Consultants (if any): Treatment Team:  [facility], MD  Discharged Condition: Improved  Hospital Course: ESSANCE GATTI is an 74 y.o. female who was admitted 09/03/2014 with a diagnosis of Primary osteoarthritis of hip and went to the operating room on 09/03/2014 and underwent the above named procedures.    She was given perioperative antibiotics:      Anti-infectives    Start     Dose/Rate Route Frequency Ordered Stop   09/03/14 2000  clindamycin (CLEOCIN) IVPB 600 mg     600 mg 100 mL/hr over 30 Minutes Intravenous Every 6 hours 09/03/14 1845 09/04/14 0338   09/03/14 1115  ceFAZolin (ANCEF) IVPB 2 g/50 mL premix  Status:  Discontinued     2 g 100 mL/hr over 30 Minutes Intravenous To ShortStay Surgical 09/03/14 1052 09/03/14 1651   09/03/14 1054  ceFAZolin (ANCEF) 2-3 GM-% IVPB SOLR    Comments:  Scronce, Trina   : cabinet override      09/03/14 1054 09/03/14 1310    .  She was given sequential compression devices, early ambulation, and ASA 325mg  for DVT  prophylaxis.  She benefited maximally from the hospital stay and there were no complications.    Recent vital signs:  Filed Vitals:   09/05/14 1000  BP: 126/75  Pulse:   Temp:   Resp:     Recent laboratory studies:  Lab Results  Component Value Date   HGB 8.9* 09/05/2014   HGB 11.6* 09/04/2014   HGB 10.8* 09/03/2014   Lab Results  Component Value Date   WBC 12.1* 09/05/2014   PLT 149* 09/05/2014   Lab Results  Component Value Date   INR 1.68* 09/03/2014   Lab Results  Component Value Date   NA 127* 09/05/2014   K 3.8 09/05/2014   CL 101 09/05/2014   CO2 22 09/05/2014   BUN 9 09/05/2014   CREATININE 0.56 09/05/2014   GLUCOSE 135* 09/05/2014    Discharge Medications:     Medication List    STOP taking these medications        acetaminophen 325 MG tablet  Commonly known as:  TYLENOL      TAKE these medications        aspirin 325 MG tablet  Take 1 tablet (325 mg total) by mouth daily.     carvedilol 12.5 MG tablet  Commonly known as:  COREG  Take 1 tablet (12.5 mg total) by mouth 2 (two) times daily.     cyclobenzaprine 10 MG tablet  Commonly known as:  FLEXERIL  Take 1 tablet (  10 mg total) by mouth 3 (three) times daily as needed for muscle spasms.     docusate sodium 100 MG capsule  Commonly known as:  COLACE  Take 1 capsule (100 mg total) by mouth 2 (two) times daily.     EPINEPHrine 0.3 mg/0.3 mL Soaj injection  Commonly known as:  EPIPEN 2-PAK  Inject 0.3 mLs (0.3 mg total) into the muscle once. As needed for anaphylaxis - lip / mouth swelling, difficulty breathing.  Call 911 if injected.     HYDROcodone-acetaminophen 5-325 MG per tablet  Commonly known as:  NORCO  Take 1-2 tablets by mouth every 6 (six) hours as needed for moderate pain.     lisinopril-hydrochlorothiazide 20-25 MG per tablet  Commonly known as:  PRINZIDE,ZESTORETIC  TAKE ONE TABLET BY MOUTH ONCE DAILY     meloxicam 15 MG tablet  Commonly known as:  MOBIC  Take 1  tablet (15 mg total) by mouth daily.     ondansetron 4 MG tablet  Commonly known as:  ZOFRAN  Take 1 tablet (4 mg total) by mouth every 8 (eight) hours as needed for nausea or vomiting.     vitamin B-12 500 MCG tablet  Commonly known as:  CYANOCOBALAMIN  Take 500 mcg by mouth daily.        Diagnostic Studies: Dg Pelvis Portable  09/03/2014   CLINICAL DATA:  Status post total hip replacement  EXAM: PORTABLE PELVIS 1-2 VIEWS  COMPARISON:  None.  FINDINGS: There is a total hip prosthesis on the right which appears well-seated. No acute fracture or dislocation. There is moderate osteoarthritic change in the left hip joint.  IMPRESSION: Right total hip prosthetic components appear well seated. No acute fracture or dislocation. Moderate osteoarthritic change left hip joint.   Electronically Signed   By: Lowella Grip III M.D.   On: 09/03/2014 19:10   Dg Chest Port 1 View  09/05/2014   CLINICAL DATA:  Shortness of breath and atelectasis.  EXAM: PORTABLE CHEST - 1 VIEW  COMPARISON:  09/03/2014  FINDINGS: Interval removal of endotracheal tube. Right central venous catheter is unchanged in position. Normal heart size and pulmonary vascularity. Lungs are clear. No blunting of costophrenic angles. No pneumothorax. Tortuous aorta.  IMPRESSION: No evidence of active pulmonary disease.   Electronically Signed   By: Lucienne Capers M.D.   On: 09/05/2014 04:58   Dg Chest Port 1 View  09/03/2014   CLINICAL DATA:  Hypoxia  EXAM: PORTABLE CHEST - 1 VIEW  COMPARISON:  November 28, 2007  FINDINGS: Endotracheal tube tip is 6.5 cm above the carina. Central catheter tip is in the superior vena cava. No pneumothorax. There is mild apical pleural thickening bilaterally, stable. No edema or consolidation. Heart size and pulmonary vascularity are normal. No adenopathy.  IMPRESSION: Tube and catheter positions as described without pneumothorax. No edema or consolidation.   Electronically Signed   By: Lowella Grip  III M.D.   On: 09/03/2014 19:08    Disposition: 01-Home or Self Care  Discharge Instructions    Weight bearing as tolerated    Complete by:  As directed   Laterality:  right  Extremity:  Lower           Follow-up Information    Follow up with MURPHY, TIMOTHY D, MD In 1 week.   Specialty:  Orthopedic Surgery   Contact information:   [address] [address] [phone]        Signed:  Kingstin Heims Lelan Pons 09/05/2014, 1:05 PM Cell [phone]

## 2014-09-05 NOTE — Clinical Social Work Note (Signed)
CSW consult acknowledged:  Clinical Social Worker received a consult in reference to post-acute placement for SNF. Pt currently recommending Home Health.   Clinical Social Worker will sign off for now as social work intervention is no longer needed. Please consult [place] again if new need arises.  Glendon Axe, MSW, LCSWA [phone] 09/05/2014 10:49 AM

## 2014-09-05 NOTE — Progress Notes (Signed)
     Subjective:  POD#2 RATH arthroplasty. Patient reports pain as mild.  Resting comfortably in bed.  Extubated yesterday. Was able to tolerate working with PT yesterday.  Is requesting to go home today.  HR was uncontrolled yesterday. Metoprolol started yesterday.  Will see how she does today.   Objective:   VITALS:   Filed Vitals:   09/05/14 0413 09/05/14 0429 09/05/14 0500 09/05/14 0600  BP:  98/53 94/55 101/60  Pulse:  86 97 85  Temp: 97.3 F (36.3 C)     TempSrc: Axillary     Resp:  20 13 12   Height:      Weight:  64.7 kg (142 lb 10.2 oz)    SpO2:  97% 99% 100%    Neurologically intact ABD soft Neurovascular intact Sensation intact distally Intact pulses distally Dorsiflexion/Plantar flexion intact Incision: dressing C/D/I   Lab Results  Component Value Date   WBC 12.1* 09/05/2014   HGB 8.9* 09/05/2014   HCT 25.2* 09/05/2014   MCV 81.3 09/05/2014   PLT 149* 09/05/2014   BMET    Component Value Date/Time   NA 127* 09/05/2014 0420   K 3.8 09/05/2014 0420   CL 101 09/05/2014 0420   CO2 22 09/05/2014 0420   GLUCOSE 135* 09/05/2014 0420   BUN 9 09/05/2014 0420   CREATININE 0.56 09/05/2014 0420   CALCIUM 7.5* 09/05/2014 0420   GFRNONAA >60 09/05/2014 0420   GFRAA >60 09/05/2014 0420     Assessment/Plan: 2 Days Post-Op   Active Problems:   Acute respiratory failure with hypoxemia   Angioedema   Urticaria   Allergic reaction to serum, anaphylactic shock   DJD (degenerative joint disease)   Up with therapy WBAT in the RLE ASA 325mg  daily for DVT prophylaxis OK from ortho standpoint to d/c today but will wait for clearance from critical care team.   Gae Dry 09/05/2014, 7:53 AM Cell [phone]

## 2014-09-05 NOTE — Care Management Note (Signed)
Case Management Note  Patient Details  Name: SEVERINA SYKORA MRN: [id number] Date of Birth: Jun 02, 1940  Subjective/Objective:       Plan for possible discharge home today or tomorrow.  PT requesting [facility]. Patient has a walker.  Will need HH PT orders and face to face placed.              Action/Plan:   Expected Discharge Date:                  Expected Discharge Plan:  [place]  In-House Referral:     Discharge planning Services     Post Acute Care Choice:    Choice offered to:     DME Arranged:    DME Agency:  [place]:  PT [facility] Agency:     Status of Service:  In process, will continue to follow  Medicare Important Message Given:    Date Medicare IM Given:    Medicare IM give by:    Date Additional Medicare IM Given:    Additional Medicare Important Message give by:     If discussed at [place] of Stay Meetings, dates discussed:    Additional Comments:  Vergie Living, RN 09/05/2014, 9:36 AM

## 2014-09-05 NOTE — Care Management Note (Signed)
Case Management Note  Patient Details  Name: Kayla Wilson MRN: [id number] Date of Birth: 04/06/1940  Subjective/Objective:           RIGHT TOTAL HIP ARTHROPLASTY          Action/Plan: Home Health  Expected Discharge Date:       09/05/2014           Expected Discharge Plan:  [place]  In-House Referral:     Discharge planning Services  CM Consult  Post Acute Care Choice:  Home Health Choice offered to:     DME Arranged:    DME Agency:  [employer] Arranged:  PT, OT [facility] Agency:  [place]  Status of Service:  Completed, signed off  Medicare Important Message Given:  N/A - LOS <3 / Initial given by admissions Date Medicare IM Given:    Medicare IM give by:    Date Additional Medicare IM Given:    Additional Medicare Important Message give by:     If discussed at [place] of Stay Meetings, dates discussed:    Additional Comments: Consulted for Home Health. Notified [facility] of scheduled dc home with HHPT/OT.  Erenest Rasher, RN 09/05/2014, 1:23 PM

## 2014-09-05 NOTE — Progress Notes (Signed)
eLink Physician-Brief Progress Note Patient Name: SUMAN TRIVEDI DOB: August 24, 1940 MRN: [id number]   Date of Service  09/05/2014  HPI/Events of Note  Borderline BP Was neg 1 liter   eICU Interventions  Bolus x 1     Intervention Category Intermediate Interventions: Hypotension - evaluation and management  Yared Susan J. 09/05/2014, 1:17 AM

## 2014-09-05 NOTE — Addendum Note (Signed)
Addendum  created 09/05/14 1134 by Nolon Nations, MD   Modules edited: Allergies

## 2014-09-05 NOTE — Progress Notes (Signed)
Discharge instructions reviewed with patient and family. Nolon Nations, MD advised pt not to take the lisinopril-HCTZ and talk to her PCP about the allergic reaction to the medication. Pt verbalized understanding. Pt d/c home with via wheelchair with family.

## 2014-09-05 NOTE — Progress Notes (Signed)
Pt B/p reading low with cuff pressure and A. line. Informed Dr. Titus Mould 500cc bolus given. Resp at bedside assessing pt's A line. A. Line out . Dr. Titus Mould aware.

## 2014-09-06 ENCOUNTER — Telehealth: Payer: Self-pay | Admitting: Adult Health

## 2014-09-06 ENCOUNTER — Encounter (HOSPITAL_COMMUNITY): Payer: Self-pay | Admitting: Orthopedic Surgery

## 2014-09-06 ENCOUNTER — Other Ambulatory Visit: Payer: Self-pay | Admitting: Adult Health

## 2014-09-06 DIAGNOSIS — I10 Essential (primary) hypertension: Secondary | ICD-10-CM

## 2014-09-06 MED ORDER — HYDROCHLOROTHIAZIDE 25 MG PO TABS: 25.0000 mg | ORAL_TABLET | Freq: Every day | ORAL | Status: DC

## 2014-09-06 NOTE — Telephone Encounter (Signed)
:   We can try her on just HCTZ. I sent a prescription to the pharmacy for HCTZ 25mg  to be taken daily. Have her follow up in office as soon as possible. Let us know on Monday how her blood pressures are over the weekend.    Called and advised pt of Cory's recommendations.  Pt does not have a bp cuff and pt states she cannot afford a bp cuff at the time. Pt verbalized understanding.

## 2014-09-06 NOTE — Telephone Encounter (Signed)
Called and spoke with pt and pt states her bp is ok now.  Pt had hip surgery on Tuesday and right after the surgery on the way to recovery she had an allergic reaction. Pt states her thorat swollen shut, tongue was long and pt had to very quickly be put on a ventilator on pt.  Pt's bp and heart rate were out of sync over the night.  Pt was told she had an allergic reaction and the anesthesiologist advised that he read some literature that the lisnopril and hctz together could sometimes cause a reaction. Advised that pt call her primary care provider and possilbe have bp medicaiton changed to another medicaiton.  This could have been a part of the problem in comination with medication administered during the surgery. Pt had an allergic reaction to a cortsione shot as well and could not move.  Pls advise on bp medication. Pt cannot come in to the office at this time due to just having surgery.

## 2014-09-06 NOTE — Telephone Encounter (Signed)
Pt would like to discuss her lisinopril-hydrochlorothiazide (PRINZIDE,ZESTORETIC) 20-25 MG per tabletv Pt had hip replacement last week and had an episode on the operating table.  Was advised to  Call her dr, and pay unable to come in for appt. pls call

## 2014-09-06 NOTE — Telephone Encounter (Signed)
Please explain? What does the pt need?

## 2014-09-06 NOTE — Telephone Encounter (Signed)
Pt wanted to talk/ you, she had recommendation form the hospital doc to change her med b/c her bp got so low on the operating table, they almost lost her.  Pt can not  physically come in so wanted to discuss over the phone.

## 2014-11-17 ENCOUNTER — Other Ambulatory Visit: Payer: Self-pay | Admitting: Internal Medicine

## 2014-12-05 ENCOUNTER — Telehealth: Payer: Self-pay | Admitting: Internal Medicine

## 2014-12-05 NOTE — Telephone Encounter (Signed)
2. Chronic systolic dysfunction EF improved with rate control (4/16 echo is reviewed)  EF 50-55% 07/08/14

## 2014-12-05 NOTE — Telephone Encounter (Signed)
New message    Pt has been identified to be part of congestive heart failure program Does pt have congestive heart failure and if so needing her latest e/f  Ashley's fax # is [phone]

## 2014-12-27 ENCOUNTER — Other Ambulatory Visit: Payer: Self-pay | Admitting: Adult Health

## 2015-02-14 ENCOUNTER — Other Ambulatory Visit: Payer: Self-pay | Admitting: Internal Medicine

## 2015-05-14 ENCOUNTER — Encounter (HOSPITAL_COMMUNITY): Payer: Self-pay | Admitting: Nurse Practitioner

## 2015-05-14 ENCOUNTER — Ambulatory Visit (HOSPITAL_COMMUNITY)
Admission: RE | Admit: 2015-05-14 | Discharge: 2015-05-14 | Disposition: A | Discharge status: Home / Self Care | Payer: Medicare Other | Source: Ambulatory Visit | Attending: Nurse Practitioner | Admitting: Nurse Practitioner

## 2015-05-14 DIAGNOSIS — Z7982 Long term (current) use of aspirin: Secondary | ICD-10-CM | POA: Diagnosis not present

## 2015-05-14 DIAGNOSIS — Z79899 Other long term (current) drug therapy: Secondary | ICD-10-CM | POA: Insufficient documentation

## 2015-05-14 DIAGNOSIS — Z833 Family history of diabetes mellitus: Secondary | ICD-10-CM | POA: Insufficient documentation

## 2015-05-14 DIAGNOSIS — Z882 Allergy status to sulfonamides status: Secondary | ICD-10-CM | POA: Diagnosis not present

## 2015-05-14 DIAGNOSIS — I4891 Unspecified atrial fibrillation: Secondary | ICD-10-CM | POA: Diagnosis present

## 2015-05-14 DIAGNOSIS — Z823 Family history of stroke: Secondary | ICD-10-CM | POA: Insufficient documentation

## 2015-05-14 DIAGNOSIS — Z8249 Family history of ischemic heart disease and other diseases of the circulatory system: Secondary | ICD-10-CM | POA: Diagnosis not present

## 2015-05-14 DIAGNOSIS — I10 Essential (primary) hypertension: Secondary | ICD-10-CM | POA: Insufficient documentation

## 2015-05-14 DIAGNOSIS — Z8673 Personal history of transient ischemic attack (TIA), and cerebral infarction without residual deficits: Secondary | ICD-10-CM | POA: Insufficient documentation

## 2015-05-14 DIAGNOSIS — Z888 Allergy status to other drugs, medicaments and biological substances status: Secondary | ICD-10-CM | POA: Diagnosis not present

## 2015-05-14 DIAGNOSIS — I482 Chronic atrial fibrillation, unspecified: Secondary | ICD-10-CM

## 2015-05-14 MED ORDER — CARVEDILOL 12.5 MG PO TABS: 12.5000 mg | ORAL_TABLET | Freq: Two times a day (BID) | ORAL | Status: DC

## 2015-05-14 NOTE — Progress Notes (Signed)
Patient ID: Kayla Wilson, female   DOB: 07-22-1940, 75 y.o.   MRN: [id number]     Primary Care Physician: Dorothyann Peng, NP Referring Physician: Dr. Laverda Page is a 75 y.o. female with a h/o chronic afib for f/u. She was suppose to f/u last fall but had hip surgery. She has progressed well since surgery. She remains in rate controlled afib and is asymptomatic. She has a birthday next week and will turn 65 and now has a chadsvasc score of 7 (age x2, htn,female). She is on baby asa only, has refused anticoagulants in the past and again today, it was discussed and she was made aware because of her birthday, her chadsvasc score has increased, meaning she is at higher risk for stroke. She understands her stroke risk, is still adament  that she does not want to be on blood thinners. Also discussed watchman as an alternative to blood thinners and she politely refuses this as well.  Today, she denies symptoms of palpitations, chest pain, shortness of breath, orthopnea, PND, lower extremity edema, dizziness, presyncope, syncope, or neurologic sequela. The patient is tolerating medications without difficulties and is otherwise without complaint today.   Past Medical History  Diagnosis Date  . Atrial fibrillation ([place])   . Hypertension   . Stroke ([place])   . Colon polyps   . Diverticulosis   . 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 No intervention required    . Complication of anesthesia     difficulty awakening   Past Surgical History  Procedure Laterality Date  . Appendectomy    . Tubal ligation    . Eye surgery Left     vitrectomy  . Total hip arthroplasty Right 09/03/2014    Procedure: RIGHT TOTAL HIP ARTHROPLASTY ANTERIOR APPROACH;  Surgeon: Renette Butters, MD;  Location: [facility];  Service: Orthopedics;  Laterality: Right;    Current Outpatient Prescriptions    Medication Sig Dispense Refill  . aspirin 81 MG tablet Take 81 mg by mouth daily.    . carvedilol (COREG) 12.5 MG tablet Take 1 tablet (12.5 mg total) by mouth 2 (two) times daily. 180 tablet 3  . hydrochlorothiazide (HYDRODIURIL) 25 MG tablet TAKE ONE TABLET BY MOUTH ONCE DAILY 30 tablet 5  . vitamin B-12 (CYANOCOBALAMIN) 500 MCG tablet Take 500 mcg by mouth daily.    Marland Kitchen EPINEPHrine (EPIPEN 2-PAK) 0.3 mg/0.3 mL IJ SOAJ injection Inject 0.3 mLs (0.3 mg total) into the muscle once. As needed for anaphylaxis - lip / mouth swelling, difficulty breathing.  Call 911 if injected. (Patient not taking: Reported on 05/14/2015) 1 Device 3   No current facility-administered medications for this encounter.    Allergies  Allergen Reactions  . Ace Inhibitors Anaphylaxis    Angioedema, perioperative  . Celebrex [Celecoxib] Itching and Other (See Comments)    Twitching   . Antihistamines, Chlorpheniramine-Type Other (See Comments)    afib  . Aspirin Other (See Comments)    REACTION: hx of hemorrhagic stroke  . Cortisone Other (See Comments)    "cortisone shoot made me jerk"  . Lidocaine Other (See Comments)    Pt was fine for ~2 days after she received medication and states she then got "sore all over and could not move"  . Phenytoin Swelling    mostly in arms  . Sulfonamide Derivatives Itching    Social History  Social History  . Marital Status: Married    Spouse Name: N/A  . Number of Children: N/A  . Years of Education: N/A   Occupational History  . Not on file.   Social History Main Topics  . Smoking status: Never Smoker   . Smokeless tobacco: Not on file  . Alcohol Use: No  . Drug Use: No  . Sexual Activity: Not on file   Other Topics Concern  . Not on file   Social History Narrative   Married 68 years    - Seven children 6 girls and one boy.    - three children live in [place] live in [place]. Some in the mountains and some on the coast.       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, flowers and working in the yard, Firefighter.              Family History  Problem Relation Age of Onset  . Stroke Mother   . Hypertension Mother   . Heart disease Father     CABG, MI  . Cancer Father     skin  . Diabetes Brother   . Heart disease Brother   . Hypertension Brother     ROS- All systems are reviewed and negative except as per the HPI above  Physical Exam: Filed Vitals:   05/14/15 1049  BP: 148/100  Pulse: 77  Height: 5\' 7"  (1.702 m)  Weight: 134 lb 9.6 oz (61.054 kg)    GEN- The patient is well appearing, alert and oriented x 3 today.   Head- normocephalic, atraumatic Eyes-  Sclera clear, conjunctiva pink Ears- hearing intact Oropharynx- clear Neck- supple, no JVP Lymph- no cervical lymphadenopathy Lungs- Clear to ausculation bilaterally, normal work of breathing Heart- Irregular rate and rhythm, no murmurs, rubs or gallops, PMI not laterally displaced GI- soft, NT, ND, + BS Extremities- no clubbing, cyanosis, or edema MS- no significant deformity or atrophy Skin- no rash or lesion Psych- euthymic mood, full affect Neuro- strength and sensation are intact  EKG-afib at 77 bpm, qrs int 86 ms, qtc 420 sec Epic records reviewed  Assessment and Plan: 1. Afib Chronic and tolerated well Continue coreg  2. Chadsvasc of at least 4 Pt informed of stroke risk but declines blood thinners, watchman Continue asa at her wishes  F/u in 6 months  Geroge Baseman. Briana Farner, [facility] [address] [place], [place] [address] [phone]

## 2015-06-26 ENCOUNTER — Other Ambulatory Visit: Payer: Self-pay | Admitting: Adult Health

## 2015-07-28 ENCOUNTER — Other Ambulatory Visit: Payer: Self-pay | Admitting: Adult Health

## 2015-08-31 ENCOUNTER — Other Ambulatory Visit: Payer: Self-pay | Admitting: Adult Health

## 2015-09-08 ENCOUNTER — Other Ambulatory Visit: Payer: Self-pay | Admitting: Adult Health

## 2015-09-08 MED ORDER — HYDROCHLOROTHIAZIDE 25 MG PO TABS: 25.0000 mg | ORAL_TABLET | Freq: Every day | ORAL | Status: DC

## 2015-09-08 NOTE — Telephone Encounter (Signed)
Pt has and appt on 09-16-15 and need a refill on hctz. [facility] [place]

## 2015-09-08 NOTE — Telephone Encounter (Signed)
Refill sent to pharmacy.   

## 2015-09-16 ENCOUNTER — Ambulatory Visit (INDEPENDENT_AMBULATORY_CARE_PROVIDER_SITE_OTHER): Payer: Medicare Other | Admitting: Adult Health

## 2015-09-16 ENCOUNTER — Encounter: Payer: Self-pay | Admitting: Adult Health

## 2015-09-16 VITALS — BP 148/92 | HR 87 | Temp 97.6°F | Resp 16 | Ht 67.0 in | Wt 134.6 lb

## 2015-09-16 DIAGNOSIS — I10 Essential (primary) hypertension: Secondary | ICD-10-CM | POA: Diagnosis not present

## 2015-09-16 LAB — BASIC METABOLIC PANEL
BUN: 8 mg/dL (ref 6–23)
CHLORIDE: 92 meq/L — AB (ref 96–112)
CO2: 34 mEq/L — ABNORMAL HIGH (ref 19–32)
Calcium: 9.7 mg/dL (ref 8.4–10.5)
Creatinine, Ser: 0.62 mg/dL (ref 0.40–1.20)
GFR: 99.65 mL/min (ref >60.00)
Glucose, Bld: 97 mg/dL (ref 70–99)
Potassium: 3.5 mEq/L (ref 3.5–5.1)
Sodium: 130 mEq/L — ABNORMAL LOW (ref 135–145)

## 2015-09-16 MED ORDER — HYDROCHLOROTHIAZIDE 25 MG PO TABS: 25.0000 mg | ORAL_TABLET | Freq: Every day | ORAL | Status: DC

## 2015-09-16 NOTE — Patient Instructions (Addendum)
It was great seeing you again.   I will follow up with you regarding your blood work   I have sent in a prescription for HCTZ, take this daily.   Follow up this fall for your annual exam

## 2015-09-16 NOTE — Progress Notes (Signed)
Subjective:    Patient ID: Kayla Wilson, female    DOB: [birth year], 75 y.o.   MRN: [id number]  HPI  75 year old female who presents to the office today for refill of HCTZ. I last saw her over a year ago. She reports that her blood pressure has been well controlled. It goes up a little when she comes to her medical appointments.   She has recovered well from hip surgery.   Cardiology is managing her Coreg that she takes for rate control.   She denies any acute issues.   Review of Systems  Constitutional: Negative.   Respiratory: Negative.   Cardiovascular: Negative.   Genitourinary: Negative.   Neurological: Negative.   All other systems reviewed and are negative.  Past Medical History  Diagnosis Date  . Atrial fibrillation ([place])   . Hypertension   . Stroke ([place])   . Colon polyps   . Diverticulosis   . 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 No intervention required    . Complication of anesthesia     difficulty awakening    Social History   Social History  . Marital Status: Married    Spouse Name: N/A  . Number of Children: N/A  . Years of Education: N/A   Occupational History  . Not on file.   Social History Main Topics  . Smoking status: Never Smoker   . Smokeless tobacco: Not on file  . Alcohol Use: No  . Drug Use: No  . Sexual Activity: Not on file   Other Topics Concern  . Not on file   Social History Narrative   Married 45 years    - Seven children 6 girls and one boy.    - three children live in [place] live in [place]. Some in the mountains and some on the coast.       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, flowers and working in the yard, Firefighter.              Past Surgical  History  Procedure Laterality Date  . Appendectomy    . Tubal ligation    . Eye surgery Left     vitrectomy  . 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 Stroke Mother   . Hypertension Mother   . Heart disease Father     CABG, MI  . Cancer Father     skin  . Diabetes Brother   . Heart disease Brother   . Hypertension Brother     Allergies  Allergen Reactions  . Ace Inhibitors Anaphylaxis    Angioedema, perioperative  . Celebrex [Celecoxib] Itching and Other (See Comments)    Twitching   . Antihistamines, Chlorpheniramine-Type Other (See Comments)    afib  . Aspirin Other (See Comments)    REACTION: hx of hemorrhagic stroke  . Cortisone Other (See Comments)    "cortisone shoot made me jerk"  . Lidocaine Other (See Comments)  Pt was fine for ~2 days after she received medication and states she then got "sore all over and could not move"  . Phenytoin Swelling    mostly in arms  . Sulfonamide Derivatives Itching    Current Outpatient Prescriptions on File Prior to Visit  Medication Sig Dispense Refill  . aspirin 81 MG tablet Take 81 mg by mouth daily.    . carvedilol (COREG) 12.5 MG tablet Take 1 tablet (12.5 mg total) by mouth 2 (two) times daily. 180 tablet 3  . EPINEPHrine (EPIPEN 2-PAK) 0.3 mg/0.3 mL IJ SOAJ injection Inject 0.3 mLs (0.3 mg total) into the muscle once. As needed for anaphylaxis - lip / mouth swelling, difficulty breathing.  Call 911 if injected. 1 Device 3  . vitamin B-12 (CYANOCOBALAMIN) 500 MCG tablet Take 500 mcg by mouth daily.     No current facility-administered medications on file prior to visit.    BP 148/92 mmHg  Pulse 87  Temp(Src) 97.6 F (36.4 C) (Oral)  Resp 16  Ht 5\' 7"  (1.702 m)  Wt 134 lb 9.6 oz (61.054 kg)  BMI 21.08 kg/m2   SpO2 97%       Objective:   Physical Exam  Constitutional: She is oriented to person, place, and time. She appears well-developed and well-nourished. No distress.  Cardiovascular: Normal rate, regular rhythm, normal heart sounds and intact distal pulses.  Exam reveals no gallop and no friction rub.   No murmur heard. Pulmonary/Chest: Effort normal and breath sounds normal. No respiratory distress. She has no wheezes. She has no rales. She exhibits no tenderness.  Neurological: She is alert and oriented to person, place, and time.  Skin: Skin is warm and dry. No rash noted. She is not diaphoretic. No erythema. No pallor.  Psychiatric: She has a normal mood and affect. Her behavior is normal. Judgment and thought content normal.  Vitals reviewed.      Assessment & Plan:  1. Essential hypertension - She needs to follow up for a physical. As she has not had one in 2 years. Will write for 9 days of HCTZ to get her through until the physical - hydrochlorothiazide (HYDRODIURIL) 25 MG tablet; Take 1 tablet (25 mg total) by mouth daily.  Dispense: 30 tablet; Refill: 0 - Basic metabolic panel - Follow up for CPE or sooner if needed  Dorothyann Peng, NP

## 2015-11-04 ENCOUNTER — Other Ambulatory Visit: Payer: Self-pay | Admitting: Adult Health

## 2015-11-04 DIAGNOSIS — I10 Essential (primary) hypertension: Secondary | ICD-10-CM

## 2015-12-04 ENCOUNTER — Other Ambulatory Visit: Payer: Self-pay | Admitting: Adult Health

## 2015-12-04 DIAGNOSIS — I10 Essential (primary) hypertension: Secondary | ICD-10-CM

## 2015-12-04 NOTE — Telephone Encounter (Signed)
Rx refill sent to pharmacy. 

## 2015-12-31 ENCOUNTER — Encounter: Payer: Self-pay | Admitting: Adult Health

## 2015-12-31 ENCOUNTER — Ambulatory Visit (INDEPENDENT_AMBULATORY_CARE_PROVIDER_SITE_OTHER): Payer: Medicare Other | Admitting: Adult Health

## 2015-12-31 VITALS — BP 142/80 | Temp 98.6°F | Ht 67.0 in | Wt 129.0 lb

## 2015-12-31 DIAGNOSIS — Z0000 Encounter for general adult medical examination without abnormal findings: Secondary | ICD-10-CM

## 2015-12-31 DIAGNOSIS — Z23 Encounter for immunization: Secondary | ICD-10-CM

## 2015-12-31 DIAGNOSIS — I10 Essential (primary) hypertension: Secondary | ICD-10-CM

## 2015-12-31 DIAGNOSIS — I482 Chronic atrial fibrillation, unspecified: Secondary | ICD-10-CM

## 2015-12-31 LAB — HEPATIC FUNCTION PANEL
ALK PHOS: 58 U/L (ref 39–117)
ALT: 12 U/L (ref 0–35)
AST: 21 U/L (ref 0–37)
Albumin: 4.2 g/dL (ref 3.5–5.2)
BILIRUBIN DIRECT: 0.2 mg/dL (ref 0.0–0.3)
Total Bilirubin: 1.2 mg/dL (ref 0.2–1.2)
Total Protein: 7.9 g/dL (ref 6.0–8.3)

## 2015-12-31 LAB — BASIC METABOLIC PANEL
BUN: 7 mg/dL (ref 6–23)
CALCIUM: 10.2 mg/dL (ref 8.4–10.5)
CO2: 32 meq/L (ref 19–32)
Chloride: 90 mEq/L — ABNORMAL LOW (ref 96–112)
Creatinine, Ser: 0.59 mg/dL (ref 0.40–1.20)
GFR: 105.44 mL/min (ref >60.00)
GLUCOSE: 89 mg/dL (ref 70–99)
Potassium: 4.5 mEq/L (ref 3.5–5.1)
Sodium: 131 mEq/L — ABNORMAL LOW (ref 135–145)

## 2015-12-31 LAB — CBC WITH DIFFERENTIAL/PLATELET
BASOS ABS: 0 10*3/uL (ref 0.0–0.1)
Basophils Relative: 0.4 % (ref 0.0–3.0)
EOS ABS: 0.1 10*3/uL (ref 0.0–0.7)
Eosinophils Relative: 0.9 % (ref 0.0–5.0)
HEMATOCRIT: 44.6 % (ref 36.0–46.0)
Hemoglobin: 15.1 g/dL — ABNORMAL HIGH (ref 12.0–15.0)
LYMPHS PCT: 38.4 % (ref 12.0–46.0)
Lymphs Abs: 2.4 10*3/uL (ref 0.7–4.0)
MCHC: 33.8 g/dL (ref 30.0–36.0)
MCV: 86.2 fl (ref 78.0–100.0)
Monocytes Absolute: 0.7 10*3/uL (ref 0.1–1.0)
Monocytes Relative: 11.3 % (ref 3.0–12.0)
NEUTROS PCT: 49 % (ref 43.0–77.0)
Neutro Abs: 3 10*3/uL (ref 1.4–7.7)
Platelets: 260 10*3/uL (ref 150.0–400.0)
RBC: 5.17 Mil/uL — ABNORMAL HIGH (ref 3.87–5.11)
RDW: 14 % (ref 11.5–15.5)
WBC: 6.2 10*3/uL (ref 4.0–10.5)

## 2015-12-31 LAB — POC URINALSYSI DIPSTICK (AUTOMATED)
BILIRUBIN UA: NEGATIVE
GLUCOSE UA: NEGATIVE
KETONES UA: NEGATIVE
Nitrite, UA: NEGATIVE
SPEC GRAV UA: 1.015
Urobilinogen, UA: 0.2
pH, UA: 8

## 2015-12-31 LAB — LIPID PANEL
Cholesterol: 218 mg/dL — ABNORMAL HIGH (ref 0–200)
HDL: 65.4 mg/dL (ref >39.00)
LDL Cholesterol: 136 mg/dL — ABNORMAL HIGH (ref 0–99)
NonHDL: 152.42
Total CHOL/HDL Ratio: 3
Triglycerides: 81 mg/dL (ref 0.0–149.0)
VLDL: 16.2 mg/dL (ref 0.0–40.0)

## 2015-12-31 LAB — TSH: TSH: 2.5 u[IU]/mL (ref 0.35–4.50)

## 2015-12-31 MED ORDER — HYDROCHLOROTHIAZIDE 25 MG PO TABS: 25.0000 mg | ORAL_TABLET | Freq: Every day | ORAL | 3 refills | Status: DC

## 2015-12-31 NOTE — Progress Notes (Signed)
Subjective:    Patient ID: Kayla Wilson, female    DOB: 08-21-1940, 75 y.o.   MRN: [id number]  HPI  Patient presents for yearly preventative medicine examination. She is a pleasant 75 year old female who  has a past medical history of Atrial fibrillation ([place]); Colon polyps; Complication of anesthesia; Diverticulosis; Hypertension; IATROGENIC CEREBROVASCULAR INFARCT/HEMORRHAGE NE (03/22/1993); and Stroke [facility]).  All immunizations and health maintenance protocols were reviewed with the patient and needed orders were placed. She does not want her flu vaccination.   Medication reconciliation,  past medical history, social history, problem list and allergies were reviewed in detail with the patient  Goals were established with regard to weight loss, exercise, and  diet in compliance with medications  End of life planning was discussed. She does not want an advanced directive or living will   She was seen in the A fib clinic in February. She is in A fib today and is rate controlled on Coreg. She refuses anticoagulants. She is asymptomatic   She does has not had an eye exam this year. She does not go to the dentist due to having false teeth.   Her last colonoscopy was in 2005 and she does not want to do another one. She does not want a mammogram and she does not do home breast exams.   Review of Systems  Constitutional: Negative.   HENT: Negative.   Eyes: Negative.   Respiratory: Negative.   Cardiovascular: Negative.   Gastrointestinal: Negative.   Endocrine: Negative.   Genitourinary: Negative.   Musculoskeletal: Negative.   Allergic/Immunologic: Negative.   Neurological: Negative.   Psychiatric/Behavioral: Negative.   All other systems reviewed and are negative.  Past Medical History:  Diagnosis Date  . Atrial fibrillation ([place])   . Colon polyps   . Complication of anesthesia    difficulty awakening  . Diverticulosis   . Hypertension   . IATROGENIC CEREBROVASCULAR  INFARCT/HEMORRHAGE NE 03/22/1993   Qualifier: Diagnosis of  By: Leanne Chang MD, [place] per pt's report-Dr Vertell Limber- hemorrhagic stroke. No intervention required    . Stroke [facility])     Social History   Social History  . Marital status: Married    Spouse name: N/A  . Number of children: N/A  . Years of education: N/A   Occupational History  . Not on file.   Social History Main Topics  . Smoking status: Never Smoker  . Smokeless tobacco: Not on file  . Alcohol use No  . Drug use: No  . Sexual activity: Not on file   Other Topics Concern  . Not on file   Social History Narrative   Married 73 years    - Seven children 6 girls and one boy.    - three children live in [place] live in [place]. Some in the mountains and some on the coast.       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, flowers and working in the yard, Firefighter.              Past Surgical History:  Procedure Laterality Date  . APPENDECTOMY    . EYE SURGERY Left    vitrectomy  . TOTAL HIP ARTHROPLASTY Right 09/03/2014   Procedure: RIGHT TOTAL HIP ARTHROPLASTY ANTERIOR APPROACH;  Surgeon: Renette Butters, MD;  Location: [facility];  Service: Orthopedics;  Laterality: Right;  . TUBAL LIGATION      Family History  Problem Relation Age of Onset  . Stroke Mother   . Hypertension Mother   . Heart disease Father     CABG, MI  . Cancer Father     skin  . Diabetes Brother   . Heart disease Brother   . Hypertension Brother     Allergies  Allergen Reactions  . Ace Inhibitors Anaphylaxis    Angioedema, perioperative  . Celebrex [Celecoxib] Itching and Other (See Comments)    Twitching   . Antihistamines, Chlorpheniramine-Type Other (See Comments)    afib  . Aspirin Other (See Comments)    REACTION: hx of hemorrhagic stroke  . Cortisone Other (See Comments)    "cortisone  shoot made me jerk"  . Lidocaine Other (See Comments)    Pt was fine for ~2 days after she received medication and states she then got "sore all over and could not move"  . Phenytoin Swelling    mostly in arms  . Sulfonamide Derivatives Itching    Current Outpatient Prescriptions on File Prior to Visit  Medication Sig Dispense Refill  . aspirin 81 MG tablet Take 81 mg by mouth daily.    . carvedilol (COREG) 12.5 MG tablet Take 1 tablet (12.5 mg total) by mouth 2 (two) times daily. 180 tablet 3  . EPINEPHrine (EPIPEN 2-PAK) 0.3 mg/0.3 mL IJ SOAJ injection Inject 0.3 mLs (0.3 mg total) into the muscle once. As needed for anaphylaxis - lip / mouth swelling, difficulty breathing.  Call 911 if injected. 1 Device 3  . hydrochlorothiazide (HYDRODIURIL) 25 MG tablet TAKE ONE TABLET BY MOUTH ONCE DAILY 30 tablet 0  . vitamin B-12 (CYANOCOBALAMIN) 500 MCG tablet Take 500 mcg by mouth daily.     No current facility-administered medications on file prior to visit.     BP (!) 142/80   Temp 98.6 F (37 C) (Oral)   Ht 5\' 7"  (1.702 m)   Wt 129 lb (58.5 kg)   BMI 20.20 kg/m       Objective:   Physical Exam  Constitutional: She is oriented to person, place, and time. She appears well-developed and well-nourished. No distress.  HENT:  Head: Normocephalic and atraumatic.  Right Ear: External ear normal.  Left Ear: External ear normal.  Nose: Nose normal.  Mouth/Throat: Oropharynx is clear and moist. No oropharyngeal exudate.  Eyes: Conjunctivae and EOM are normal. Pupils are equal, round, and reactive to light. Right eye exhibits no discharge. Left eye exhibits no discharge. No scleral icterus.  Neck: Normal range of motion. Neck supple. No JVD present. Carotid bruit is not present. No tracheal deviation present. No thyroid mass and no thyromegaly present.  Cardiovascular: Normal rate, normal heart sounds and intact distal pulses.  An irregularly irregular rhythm present. Exam reveals no gallop  and no friction rub.   No murmur heard. Pulmonary/Chest: Effort normal and breath sounds normal. No stridor. No respiratory distress. She has no wheezes. She has no rales. She exhibits no tenderness.  Abdominal: Soft. Normal appearance, normal aorta and bowel sounds are normal. She exhibits no distension and no mass. There is no tenderness. There is no rebound and no guarding.  Genitourinary:  Genitourinary Comments: Refused  Musculoskeletal: Normal range of motion. She exhibits no edema.  Lymphadenopathy:    She has no cervical adenopathy.  Neurological: She is alert and oriented to  person, place, and time. She has normal reflexes. She displays normal reflexes. No cranial nerve deficit. She exhibits normal muscle tone. Coordination normal.  Skin: Skin is warm and dry. No rash noted. She is not diaphoretic. No erythema. No pallor.  Psychiatric: She has a normal mood and affect. Her behavior is normal. Judgment and thought content normal.  Nursing note and vitals reviewed.     Assessment & Plan:  1. Routine general medical examination at a health care facility - POCT Urinalysis Dipstick (Automated) - Basic metabolic panel - CBC with Differential/Platelet - Hepatic function panel - Lipid panel - TSH - Continue to stay active.  - Educated on the importance of a heart healthy diet - Follow up in one year or sooner if needed  2. Essential hypertension - near goal. She does not want to go on any additional medications at this time - hydrochlorothiazide (HYDRODIURIL) 25 MG tablet; Take 1 tablet (25 mg total) by mouth daily.  Dispense: 90 tablet; Refill: 3 - POCT Urinalysis Dipstick (Automated) - Basic metabolic panel - CBC with Differential/Platelet - Hepatic function panel - Lipid panel - TSH  3. Chronic atrial fibrillation ([facility]) - rate controlled  - Continue with cardiology recommendations  - POCT Urinalysis Dipstick (Automated) - Basic metabolic panel - CBC with  Differential/Platelet - Hepatic function panel - Lipid panel - TSH  4. Need for vaccination with 13-polyvalent pneumococcal conjugate vaccine  - Pneumococcal conjugate vaccine 13-valent  Dorothyann Peng, NP

## 2015-12-31 NOTE — Patient Instructions (Addendum)
It was great seeing you today!  I will follow up with you regarding your lab work.   Stay active and eat healthy   Follow up in one year or sooner if needed  Health Maintenance, Female Adopting a healthy lifestyle and getting preventive care can go a long way to promote health and wellness. Talk with your health care provider about what schedule of regular examinations is right for you. This is a good chance for you to check in with your provider about disease prevention and staying healthy. In between checkups, there are plenty of things you can do on your own. Experts have done a lot of research about which lifestyle changes and preventive measures are most likely to keep you healthy. Ask your health care provider for more information. WEIGHT AND DIET  Eat a healthy diet  Be sure to include plenty of vegetables, fruits, low-fat dairy products, and lean protein.  Do not eat a lot of foods high in solid fats, added sugars, or salt.  Get regular exercise. This is one of the most important things you can do for your health.  Most adults should exercise for at least 150 minutes each week. The exercise should increase your heart rate and make you sweat (moderate-intensity exercise).  Most adults should also do strengthening exercises at least twice a week. This is in addition to the moderate-intensity exercise.  Maintain a healthy weight  Body mass index (BMI) is a measurement that can be used to identify possible weight problems. It estimates body fat based on height and weight. Your health care provider can help determine your BMI and help you achieve or maintain a healthy weight.  For females 63 years of age and older:   A BMI below 18.5 is considered underweight.  A BMI of 18.5 to 24.9 is normal.  A BMI of 25 to 29.9 is considered overweight.  A BMI of 30 and above is considered obese.  Watch levels of cholesterol and blood lipids  You should start having your blood tested  for lipids and cholesterol at 75 years of age, then have this test every 5 years.  You may need to have your cholesterol levels checked more often if:  Your lipid or cholesterol levels are high.  You are older than 75 years of age.  You are at high risk for heart disease.  CANCER SCREENING   Lung Cancer  Lung cancer screening is recommended for adults 72-65 years old who are at high risk for lung cancer because of a history of smoking.  A yearly low-dose CT scan of the lungs is recommended for people who:  Currently smoke.  Have quit within the past 15 years.  Have at least a 30-pack-year history of smoking. A pack year is smoking an average of one pack of cigarettes a day for 1 year.  Yearly screening should continue until it has been 15 years since you quit.  Yearly screening should stop if you develop a health problem that would prevent you from having lung cancer treatment.  Breast Cancer  Practice breast self-awareness. This means understanding how your breasts normally appear and feel.  It also means doing regular breast self-exams. Let your health care provider know about any changes, no matter how small.  If you are in your 20s or 30s, you should have a clinical breast exam (CBE) by a health care provider every 1-3 years as part of a regular health exam.  If you are 40 or older,  have a CBE every year. Also consider having a breast X-ray (mammogram) every year.  If you have a family history of breast cancer, talk to your health care provider about genetic screening.  If you are at high risk for breast cancer, talk to your health care provider about having an MRI and a mammogram every year.  Breast cancer gene (BRCA) assessment is recommended for women who have family members with BRCA-related cancers. BRCA-related cancers include:  Breast.  Ovarian.  Tubal.  Peritoneal cancers.  Results of the assessment will determine the need for genetic counseling and  BRCA1 and BRCA2 testing. Cervical Cancer Your health care provider may recommend that you be screened regularly for cancer of the pelvic organs (ovaries, uterus, and vagina). This screening involves a pelvic examination, including checking for microscopic changes to the surface of your cervix (Pap test). You may be encouraged to have this screening done every 3 years, beginning at age 80.  For women ages 81-65, health care providers may recommend pelvic exams and Pap testing every 3 years, or they may recommend the Pap and pelvic exam, combined with testing for human papilloma virus (HPV), every 5 years. Some types of HPV increase your risk of cervical cancer. Testing for HPV may also be done on women of any age with unclear Pap test results.  Other health care providers may not recommend any screening for nonpregnant women who are considered low risk for pelvic cancer and who do not have symptoms. Ask your health care provider if a screening pelvic exam is right for you.  If you have had past treatment for cervical cancer or a condition that could lead to cancer, you need Pap tests and screening for cancer for at least 20 years after your treatment. If Pap tests have been discontinued, your risk factors (such as having a new sexual partner) need to be reassessed to determine if screening should resume. Some women have medical problems that increase the chance of getting cervical cancer. In these cases, your health care provider may recommend more frequent screening and Pap tests. Colorectal Cancer  This type of cancer can be detected and often prevented.  Routine colorectal cancer screening usually begins at 75 years of age and continues through 75 years of age.  Your health care provider may recommend screening at an earlier age if you have risk factors for colon cancer.  Your health care provider may also recommend using home test kits to check for hidden blood in the stool.  A small camera at  the end of a tube can be used to examine your colon directly (sigmoidoscopy or colonoscopy). This is done to check for the earliest forms of colorectal cancer.  Routine screening usually begins at age 55.  Direct examination of the colon should be repeated every 5-10 years through 75 years of age. However, you may need to be screened more often if early forms of precancerous polyps or small growths are found. Skin Cancer  Check your skin from head to toe regularly.  Tell your health care provider about any new moles or changes in moles, especially if there is a change in a mole's shape or color.  Also tell your health care provider if you have a mole that is larger than the size of a pencil eraser.  Always use sunscreen. Apply sunscreen liberally and repeatedly throughout the day.  Protect yourself by wearing long sleeves, pants, a wide-brimmed hat, and sunglasses whenever you are outside. HEART DISEASE, DIABETES, AND  HIGH BLOOD PRESSURE   High blood pressure causes heart disease and increases the risk of stroke. High blood pressure is more likely to develop in:  People who have blood pressure in the high end of the normal range (130-139/85-89 mm Hg).  People who are overweight or obese.  People who are African American.  If you are 18-39 years of age, have your blood pressure checked every 3-5 years. If you are 40 years of age or older, have your blood pressure checked every year. You should have your blood pressure measured twice--once when you are at a hospital or clinic, and once when you are not at a hospital or clinic. Record the average of the two measurements. To check your blood pressure when you are not at a hospital or clinic, you can use:  An automated blood pressure machine at a pharmacy.  A home blood pressure monitor.  If you are between 55 years and 79 years old, ask your health care provider if you should take aspirin to prevent strokes.  Have regular diabetes  screenings. This involves taking a blood sample to check your fasting blood sugar level.  If you are at a normal weight and have a low risk for diabetes, have this test once every three years after 75 years of age.  If you are overweight and have a high risk for diabetes, consider being tested at a younger age or more often. PREVENTING INFECTION  Hepatitis B  If you have a higher risk for hepatitis B, you should be screened for this virus. You are considered at high risk for hepatitis B if:  You were born in a country where hepatitis B is common. Ask your health care provider which countries are considered high risk.  Your parents were born in a high-risk country, and you have not been immunized against hepatitis B (hepatitis B vaccine).  You have HIV or AIDS.  You use needles to inject street drugs.  You live with someone who has hepatitis B.  You have had sex with someone who has hepatitis B.  You get hemodialysis treatment.  You take certain medicines for conditions, including cancer, organ transplantation, and autoimmune conditions. Hepatitis C  Blood testing is recommended for:  Everyone born from 1945 through 1965.  Anyone with known risk factors for hepatitis C. Sexually transmitted infections (STIs)  You should be screened for sexually transmitted infections (STIs) including gonorrhea and chlamydia if:  You are sexually active and are younger than 75 years of age.  You are older than 75 years of age and your health care provider tells you that you are at risk for this type of infection.  Your sexual activity has changed since you were last screened and you are at an increased risk for chlamydia or gonorrhea. Ask your health care provider if you are at risk.  If you do not have HIV, but are at risk, it may be recommended that you take a prescription medicine daily to prevent HIV infection. This is called pre-exposure prophylaxis (PrEP). You are considered at risk  if:  You are sexually active and do not regularly use condoms or know the HIV status of your partner(s).  You take drugs by injection.  You are sexually active with a partner who has HIV. Talk with your health care provider about whether you are at high risk of being infected with HIV. If you choose to begin PrEP, you should first be tested for HIV. You should then be tested   every 3 months for as long as you are taking PrEP.  PREGNANCY   If you are premenopausal and you may become pregnant, ask your health care provider about preconception counseling.  If you may become pregnant, take 400 to 800 micrograms (mcg) of folic acid every day.  If you want to prevent pregnancy, talk to your health care provider about birth control (contraception). OSTEOPOROSIS AND MENOPAUSE   Osteoporosis is a disease in which the bones lose minerals and strength with aging. This can result in serious bone fractures. Your risk for osteoporosis can be identified using a bone density scan.  If you are 65 years of age or older, or if you are at risk for osteoporosis and fractures, ask your health care provider if you should be screened.  Ask your health care provider whether you should take a calcium or vitamin D supplement to lower your risk for osteoporosis.  Menopause may have certain physical symptoms and risks.  Hormone replacement therapy may reduce some of these symptoms and risks. Talk to your health care provider about whether hormone replacement therapy is right for you.  HOME CARE INSTRUCTIONS   Schedule regular health, dental, and eye exams.  Stay current with your immunizations.   Do not use any tobacco products including cigarettes, chewing tobacco, or electronic cigarettes.  If you are pregnant, do not drink alcohol.  If you are breastfeeding, limit how much and how often you drink alcohol.  Limit alcohol intake to no more than 1 drink per day for nonpregnant women. One drink equals 12  ounces of beer, 5 ounces of wine, or 1 ounces of hard liquor.  Do not use street drugs.  Do not share needles.  Ask your health care provider for help if you need support or information about quitting drugs.  Tell your health care provider if you often feel depressed.  Tell your health care provider if you have ever been abused or do not feel safe at home.   This information is not intended to replace advice given to you by your health care provider. Make sure you discuss any questions you have with your health care provider.   Document Released: 09/21/2010 Document Revised: 03/29/2014 Document Reviewed: 02/07/2013 Elsevier Interactive Patient Education 2016 Else[employer] Inc.  

## 2016-01-08 ENCOUNTER — Telehealth: Payer: Self-pay | Admitting: Adult Health

## 2016-01-08 NOTE — Telephone Encounter (Signed)
Patient notified of lab results - patient verbalized understanding.

## 2016-01-08 NOTE — Telephone Encounter (Signed)
Pt needs blood work results. Pt can be reach on cell [phone]

## 2016-05-18 ENCOUNTER — Encounter (HOSPITAL_COMMUNITY): Payer: Self-pay | Admitting: Nurse Practitioner

## 2016-05-18 ENCOUNTER — Ambulatory Visit (HOSPITAL_COMMUNITY)
Admission: RE | Admit: 2016-05-18 | Discharge: 2016-05-18 | Disposition: A | Discharge status: Home / Self Care | Payer: Medicare Other | Source: Ambulatory Visit | Attending: Nurse Practitioner | Admitting: Nurse Practitioner

## 2016-05-18 VITALS — BP 158/92 | HR 84 | Ht 67.0 in | Wt 133.0 lb

## 2016-05-18 DIAGNOSIS — I482 Chronic atrial fibrillation: Secondary | ICD-10-CM | POA: Diagnosis not present

## 2016-05-18 DIAGNOSIS — Z86010 Personal history of colonic polyps: Secondary | ICD-10-CM | POA: Insufficient documentation

## 2016-05-18 DIAGNOSIS — Z96641 Presence of right artificial hip joint: Secondary | ICD-10-CM | POA: Diagnosis not present

## 2016-05-18 DIAGNOSIS — K5730 Diverticulosis of large intestine without perforation or abscess without bleeding: Secondary | ICD-10-CM | POA: Insufficient documentation

## 2016-05-18 DIAGNOSIS — Z8673 Personal history of transient ischemic attack (TIA), and cerebral infarction without residual deficits: Secondary | ICD-10-CM | POA: Diagnosis not present

## 2016-05-18 DIAGNOSIS — I4891 Unspecified atrial fibrillation: Secondary | ICD-10-CM | POA: Diagnosis not present

## 2016-05-18 DIAGNOSIS — I4821 Permanent atrial fibrillation: Secondary | ICD-10-CM

## 2016-05-18 DIAGNOSIS — I10 Essential (primary) hypertension: Secondary | ICD-10-CM | POA: Insufficient documentation

## 2016-05-18 MED ORDER — CARVEDILOL 12.5 MG PO TABS: 12.5000 mg | ORAL_TABLET | Freq: Two times a day (BID) | ORAL | 3 refills | Status: DC

## 2016-05-18 NOTE — Progress Notes (Signed)
Patient ID: Kayla Wilson, female   DOB: 08-28-1940, 76 y.o.   MRN: [id number]     Primary Care Physician: Dorothyann Peng, NP Referring Physician: Dr. Laverda Page is a 76 y.o. female with a h/o chronic afib for f/u. She was suppose to f/u last fall but had hip surgery. She has progressed well since surgery. She remains in rate controlled afib and is asymptomatic. She has a birthday next week and will turn 76 and now has a chadsvasc score of 21 (age x2, htn,female). She is on baby asa only, has refused anticoagulants in the past and again today, it was discussed and she was made aware because of her birthday, her chadsvasc score has increased, meaning she is at higher risk for stroke. She understands her stroke risk, is still adament  that she does not want to be on blood thinners. Also discussed watchman as an alternative to blood thinners and she politely refuses this as well.  Returns to afib clinic for one year f/u, 05/18/16. She is 76 today, states she has done well over the last year. She is much more mobile this year from previous hip surgery. She remains well rate controlled and still is very clear in her decision not to take anticoagulants. Saw her PCP in the fall with labs, no issues.  Today, she denies symptoms of palpitations, chest pain, shortness of breath, orthopnea, PND, lower extremity edema, dizziness, presyncope, syncope, or neurologic sequela. The patient is tolerating medications without difficulties and is otherwise without complaint today.   Past Medical History:  Diagnosis Date  . Atrial fibrillation ([place])   . Colon polyps   . Complication of anesthesia    difficulty awakening  . Diverticulosis   . Hypertension   . 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 No intervention required    . Stroke [facility])    Past Surgical History:  Procedure  Laterality Date  . APPENDECTOMY    . EYE SURGERY Left    vitrectomy  . 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 TUBAL LIGATION      Current Outpatient Prescriptions  Medication Sig Dispense Refill  . carvedilol (COREG) 12.5 MG tablet Take 1 tablet (12.5 mg total) by mouth 2 (two) times daily. 180 tablet 3  . EPINEPHrine (EPIPEN 2-PAK) 0.3 mg/0.3 mL IJ SOAJ injection Inject 0.3 mLs (0.3 mg total) into the muscle once. As needed for anaphylaxis - lip / mouth swelling, difficulty breathing.  Call 911 if injected. 1 Device 3  . hydrochlorothiazide (HYDRODIURIL) 25 MG tablet Take 1 tablet (25 mg total) by mouth daily. 90 tablet 3  . vitamin B-12 (CYANOCOBALAMIN) 500 MCG tablet Take 500 mcg by mouth daily.     No current facility-administered medications for this encounter.     Allergies  Allergen Reactions  . Ace Inhibitors Anaphylaxis    Angioedema, perioperative  . Celebrex [Celecoxib] Itching and Other (See Comments)    Twitching   . Antihistamines, Chlorpheniramine-Type Other (See Comments)    afib  . Aspirin Other (See Comments)    REACTION: hx of hemorrhagic stroke  . Cortisone Other (See Comments)    "cortisone shoot made me jerk"  . Lidocaine Other (See Comments)    Pt was fine  for ~2 days after she received medication and states she then got "sore all over and could not move"  . Phenytoin Swelling    mostly in arms  . Sulfonamide Derivatives Itching    Social History   Social History  . Marital status: Married    Spouse name: N/A  . Number of children: N/A  . Years of education: N/A   Occupational History  . Not on file.   Social History Main Topics  . Smoking status: Never Smoker  . Smokeless tobacco: Never Used  . Alcohol use No  . Drug use: No  . Sexual activity: Not on file   Other Topics Concern  .  Not on file   Social History Narrative   Married 39 years    - Seven children 6 girls and one boy.    - three children live in [place] live in [place].       11 grand children and 7 great grandchildren      Worked at [employer] for four years and then started having children       Diet: Fast food and cooks at home      Likes to [employer], flowers and working in the yard, Firefighter.              Family History  Problem Relation Age of Onset  . Stroke Mother   . Hypertension Mother   . Heart disease Father     CABG, MI  . Cancer Father     skin  . Diabetes Brother   . Heart disease Brother   . Hypertension Brother     ROS- All systems are reviewed and negative except as per the HPI above  Physical Exam: Vitals:   05/18/16 1131  BP: (!) 158/92  Pulse: 84  Weight: 133 lb (60.3 kg)  Height: 5\' 7"  (1.702 m)    GEN- The patient is well appearing, alert and oriented x 3 today.   Head- normocephalic, atraumatic Eyes-  Sclera clear, conjunctiva pink Ears- hearing intact Oropharynx- clear Neck- supple, no JVP Lymph- no cervical lymphadenopathy Lungs- Clear to ausculation bilaterally, normal work of breathing Heart- Irregular rate and rhythm, no murmurs, rubs or gallops, PMI not laterally displaced GI- soft, NT, ND, + BS Extremities- no clubbing, cyanosis, or edema MS- no significant deformity or atrophy Skin- no rash or lesion Psych- euthymic mood, full affect Neuro- strength and sensation are intact  EKG-afib at 84 bpm, qrs int 86 ms, qtc 458 sec Epic records reviewed  Assessment and Plan: 1. Afib, long standing permanent Well rate controlled and is asymptomatic Continue coreg  2. Chadsvasc of at least 4 Pt informed of stroke risk but very clearly declines blood thinners, watchman Continues asa at her wishes  F/u afib clinic as needed  Butch Penny C. Archibald Marchetta, [facility] [address] [place], [place] [address] [phone]

## 2017-01-10 ENCOUNTER — Telehealth: Payer: Self-pay | Admitting: Adult Health

## 2017-01-10 DIAGNOSIS — I10 Essential (primary) hypertension: Secondary | ICD-10-CM

## 2017-01-11 NOTE — Telephone Encounter (Signed)
Left a message for a return call.  Pt needs yearly visit and fasting labs.

## 2017-01-25 NOTE — Telephone Encounter (Signed)
Pt has made cpe for 11/21 but has been out of her hydrochlorothiazide (HYDRODIURIL) 25 MG tablet  Over a week. Would like to know if we can send in 30 day to  [place], [place] [address]

## 2017-01-25 NOTE — Telephone Encounter (Signed)
Sent to the pharmacy by e-scribe. 

## 2017-02-07 ENCOUNTER — Telehealth: Payer: Self-pay | Admitting: Adult Health

## 2017-02-07 NOTE — Telephone Encounter (Signed)
Called to see if pt wanted to do AWV while she was here on the 28th - spoke to husband and he will have her call us back  - CRM created.

## 2017-02-09 ENCOUNTER — Encounter: Payer: Medicare Other | Admitting: Adult Health

## 2017-02-16 ENCOUNTER — Ambulatory Visit (INDEPENDENT_AMBULATORY_CARE_PROVIDER_SITE_OTHER): Payer: Medicare Other | Admitting: Adult Health

## 2017-02-16 ENCOUNTER — Encounter: Payer: Self-pay | Admitting: Adult Health

## 2017-02-16 VITALS — BP 146/98 | Temp 97.8°F | Ht 64.0 in | Wt 128.0 lb

## 2017-02-16 DIAGNOSIS — I482 Chronic atrial fibrillation: Secondary | ICD-10-CM | POA: Diagnosis not present

## 2017-02-16 DIAGNOSIS — I10 Essential (primary) hypertension: Secondary | ICD-10-CM

## 2017-02-16 DIAGNOSIS — I4821 Permanent atrial fibrillation: Secondary | ICD-10-CM

## 2017-02-16 LAB — CBC WITH DIFFERENTIAL/PLATELET
BASOS PCT: 0.4 % (ref 0.0–3.0)
Basophils Absolute: 0 10*3/uL (ref 0.0–0.1)
EOS PCT: 0.8 % (ref 0.0–5.0)
Eosinophils Absolute: 0 10*3/uL (ref 0.0–0.7)
HEMATOCRIT: 42.2 % (ref 36.0–46.0)
HEMOGLOBIN: 14.2 g/dL (ref 12.0–15.0)
LYMPHS PCT: 35.4 % (ref 12.0–46.0)
Lymphs Abs: 1.8 10*3/uL (ref 0.7–4.0)
MCHC: 33.6 g/dL (ref 30.0–36.0)
MCV: 89.1 fl (ref 78.0–100.0)
MONOS PCT: 10.1 % (ref 3.0–12.0)
Monocytes Absolute: 0.5 10*3/uL (ref 0.1–1.0)
Neutro Abs: 2.7 10*3/uL (ref 1.4–7.7)
Neutrophils Relative %: 53.3 % (ref 43.0–77.0)
Platelets: 215 10*3/uL (ref 150.0–400.0)
RBC: 4.74 Mil/uL (ref 3.87–5.11)
RDW: 13.8 % (ref 11.5–15.5)
WBC: 5.1 10*3/uL (ref 4.0–10.5)

## 2017-02-16 LAB — BASIC METABOLIC PANEL
BUN: 5 mg/dL — AB (ref 6–23)
CALCIUM: 9.6 mg/dL (ref 8.4–10.5)
CO2: 32 meq/L (ref 19–32)
Chloride: 91 mEq/L — ABNORMAL LOW (ref 96–112)
Creatinine, Ser: 0.63 mg/dL (ref 0.40–1.20)
GFR: 97.46 mL/min (ref >60.00)
GLUCOSE: 99 mg/dL (ref 70–99)
Potassium: 3.4 mEq/L — ABNORMAL LOW (ref 3.5–5.1)
Sodium: 130 mEq/L — ABNORMAL LOW (ref 135–145)

## 2017-02-16 LAB — HEPATIC FUNCTION PANEL
ALBUMIN: 4.2 g/dL (ref 3.5–5.2)
ALT: 11 U/L (ref 0–35)
AST: 20 U/L (ref 0–37)
Alkaline Phosphatase: 57 U/L (ref 39–117)
Bilirubin, Direct: 0.2 mg/dL (ref 0.0–0.3)
TOTAL PROTEIN: 7.5 g/dL (ref 6.0–8.3)
Total Bilirubin: 1.3 mg/dL — ABNORMAL HIGH (ref 0.2–1.2)

## 2017-02-16 LAB — LIPID PANEL
CHOLESTEROL: 202 mg/dL — AB (ref 0–200)
HDL: 58.9 mg/dL (ref >39.00)
LDL CALC: 130 mg/dL — AB (ref 0–99)
NonHDL: 143.22
Total CHOL/HDL Ratio: 3
Triglycerides: 67 mg/dL (ref 0.0–149.0)
VLDL: 13.4 mg/dL (ref 0.0–40.0)

## 2017-02-16 LAB — TSH: TSH: 4.32 u[IU]/mL (ref 0.35–4.50)

## 2017-02-16 MED ORDER — CARVEDILOL 12.5 MG PO TABS: 12.5000 mg | ORAL_TABLET | Freq: Two times a day (BID) | ORAL | 3 refills | Status: DC

## 2017-02-16 MED ORDER — HYDROCHLOROTHIAZIDE 25 MG PO TABS: 25.0000 mg | ORAL_TABLET | Freq: Every day | ORAL | 3 refills | Status: DC

## 2017-02-16 NOTE — Progress Notes (Signed)
Subjective:    Patient ID: Kayla Wilson, female    DOB: 1940-12-12, 76 y.o.   MRN: [id number]  HPI  Patient presents for yearly preventative medicine examination. 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  She takes HCTZ 25 mg for hypertension   She takes Coreg 12.5 mg and ASA 81 mg for A-fib. She was seen in the A fib clinic in February. She continues to refuse anticoagulants. Denies any symptoms. She is on an as needed basis with that clinic.   All immunizations and health maintenance protocols were reviewed with the patient and needed orders were placed. She is due for her flu shot but refuses  Appropriate screening laboratory values were ordered for the patient including screening of hyperlipidemia, renal function and hepatic function.  Medication reconciliation,  past medical history, social history, problem list and allergies were reviewed in detail with the patient  Goals were established with regard to weight loss, exercise, and  diet in compliance with medications. She is staying active. Does not follow a specific diet.   End of life planning was discussed. She does not want information on an advanced directive or living will.   Her last colonoscopy was in 2005. She does not want a mammogram and she does not do home breast exams. She refused breast exam today as well as Dexa scan   She has no acute complaints today   Review of Systems  Constitutional: Negative.   HENT: Negative.   Eyes: Negative.   Respiratory: Negative.   Cardiovascular: Negative.   Gastrointestinal: Negative.   Endocrine: Negative.   Genitourinary: Negative.   Musculoskeletal: Positive for arthralgias, back pain and gait problem.  Skin: Negative.   Allergic/Immunologic: Negative.     Hematological: Negative.   Psychiatric/Behavioral: Negative.   All other systems reviewed and are negative.  Past Medical History:  Diagnosis Date  . Atrial fibrillation ([place])   . Colon polyps   . Complication of anesthesia    difficulty awakening  . Diverticulosis   . Hypertension   . IATROGENIC CEREBROVASCULAR INFARCT/HEMORRHAGE NE 03/22/1993   Qualifier: Diagnosis of  By: Leanne Chang MD, [place] per pt's report-Dr Vertell Limber- hemorrhagic stroke. No intervention required    . Stroke [facility])     Social History   Socioeconomic History  . Marital status: Married    Spouse name: Not on file  . Number of children: Not on file  . Years of education: Not on file  . Highest education level: Not on file  Social Needs  . Financial resource strain: Not on file  . Food insecurity - worry: Not on file  . Food insecurity - inability: Not on file  . Transportation needs - medical: Not on file  . Transportation needs - non-medical: Not on file  Occupational History  . Not on file  Tobacco Use  . Smoking status: Never Smoker  . Smokeless tobacco: Never Used  Substance and Sexual Activity  . Alcohol use: No  . Drug use: No  . Sexual activity: Not on file  Other Topics Concern  . Not on file  Social History Narrative   Married 9 years    - Seven children 6 girls and one boy.    - three children live in [place] live [place].       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, flowers and working in the yard, Firefighter.           Past Surgical History:  Procedure Laterality Date  . APPENDECTOMY    . EYE SURGERY Left    vitrectomy  . TOTAL HIP ARTHROPLASTY Right 09/03/2014   Procedure: RIGHT TOTAL HIP ARTHROPLASTY ANTERIOR APPROACH;  Surgeon: Renette Butters, MD;  Location: [place];  Service: Orthopedics;   Laterality: Right;  . TUBAL LIGATION      Family History  Problem Relation Age of Onset  . Stroke Mother   . Hypertension Mother   . Heart disease Father        CABG, MI  . Cancer Father        skin  . Diabetes Brother   . Heart disease Brother   . Hypertension Brother     Allergies  Allergen Reactions  . Ace Inhibitors Anaphylaxis    Angioedema, perioperative  . Celebrex [Celecoxib] Itching and Other (See Comments)    Twitching   . Antihistamines, Chlorpheniramine-Type Other (See Comments)    afib  . Aspirin Other (See Comments)    REACTION: hx of hemorrhagic stroke  . Cortisone Other (See Comments)    "cortisone shoot made me jerk"  . Lidocaine Other (See Comments)    Pt was fine for ~2 days after she received medication and states she then got "sore all over and could not move"  . Phenytoin Swelling    mostly in arms  . Sulfonamide Derivatives Itching    Current Outpatient Medications on File Prior to Visit  Medication Sig Dispense Refill  . EPINEPHrine (EPIPEN 2-PAK) 0.3 mg/0.3 mL IJ SOAJ injection Inject 0.3 mLs (0.3 mg total) into the muscle once. As needed for anaphylaxis - lip / mouth swelling, difficulty breathing.  Call 911 if injected. 1 Device 3  . vitamin B-12 (CYANOCOBALAMIN) 500 MCG tablet Take 500 mcg by mouth daily.     No current facility-administered medications on file prior to visit.     BP (!) 146/98 (BP Location: Right Arm)   Temp 97.8 F (36.6 C) (Oral)   Ht 5\' 4"  (1.626 m)   Wt 128 lb (58.1 kg)   BMI 21.97 kg/m       Objective:   Physical Exam  Constitutional: She is oriented to person, place, and time. She appears well-developed and well-nourished. No distress.  HENT:  Head: Normocephalic and atraumatic.  Right Ear: External ear normal.  Left Ear: External ear normal.  Nose: Nose normal.  Mouth/Throat: Oropharynx is clear and moist. No oropharyngeal exudate.  Eyes: Conjunctivae and EOM are normal. Pupils are equal, round, and  reactive to light. Right eye exhibits no discharge. Left eye exhibits no discharge. No scleral icterus.  Neck: Normal range of motion. Neck supple. No tracheal deviation present. No thyromegaly present.  Cardiovascular: Normal rate, normal heart sounds and intact distal pulses. An irregularly irregular rhythm present. Exam reveals no gallop and no friction rub.  No murmur heard. Pulmonary/Chest: Effort normal and breath sounds normal. No respiratory distress. She has no wheezes. She has no rales. She exhibits no tenderness.  Abdominal: Soft. Bowel sounds are normal. She exhibits no distension and no mass. There is no  tenderness. There is no rebound and no guarding.  Genitourinary:  Genitourinary Comments: Refused    Musculoskeletal: Normal range of motion. She exhibits no edema, tenderness or deformity.  Lymphadenopathy:    She has no cervical adenopathy.  Neurological: She is alert and oriented to person, place, and time. She has normal reflexes. She displays normal reflexes. No cranial nerve deficit. She exhibits normal muscle tone. Coordination normal.  Skin: Skin is warm and dry. No rash noted. No erythema. No pallor.  Chronic lower extremity skin discoloration    Psychiatric: She has a normal mood and affect. Her behavior is normal. Judgment and thought content normal.  Nursing note and vitals reviewed.     Assessment & Plan:  1. Essential hypertension - near goal. No change in medications  - carvedilol (COREG) 12.5 MG tablet; Take 1 tablet (12.5 mg total) by mouth 2 (two) times daily.  Dispense: 180 tablet; Refill: 3 - hydrochlorothiazide (HYDRODIURIL) 25 MG tablet; Take 1 tablet (25 mg total) by mouth daily.  Dispense: 90 tablet; Refill: 3 - CBC with Differential/Platelet - Basic metabolic panel - Hepatic function panel - Lipid panel - TSH  2. Permanent atrial fibrillation (HCC)  - carvedilol (COREG) 12.5 MG tablet; Take 1 tablet (12.5 mg total) by mouth 2 (two) times daily.   Dispense: 180 tablet; Refill: 3   Dorothyann Peng, NP

## 2017-02-16 NOTE — Patient Instructions (Addendum)
It was great seeing you today   I will follow up with you regarding your blood work   Medications have been sent in for one year   Please follow up in one year or sooner if needed

## 2017-11-04 ENCOUNTER — Emergency Department (HOSPITAL_COMMUNITY): Payer: Medicare Other

## 2017-11-04 ENCOUNTER — Observation Stay (HOSPITAL_COMMUNITY): Payer: Medicare Other

## 2017-11-04 ENCOUNTER — Inpatient Hospital Stay (HOSPITAL_COMMUNITY)
Admission: EM | Admit: 2017-11-04 | Discharge: 2017-11-07 | DRG: 064 | Disposition: A | Discharge status: Home / Self Care | Payer: Medicare Other | Attending: Neurology | Admitting: Neurology

## 2017-11-04 ENCOUNTER — Encounter (HOSPITAL_COMMUNITY): Payer: Self-pay | Admitting: Radiology

## 2017-11-04 ENCOUNTER — Other Ambulatory Visit: Payer: Self-pay

## 2017-11-04 DIAGNOSIS — R471 Dysarthria and anarthria: Secondary | ICD-10-CM | POA: Diagnosis present

## 2017-11-04 DIAGNOSIS — R29701 NIHSS score 1: Secondary | ICD-10-CM | POA: Diagnosis not present

## 2017-11-04 DIAGNOSIS — K449 Diaphragmatic hernia without obstruction or gangrene: Secondary | ICD-10-CM | POA: Diagnosis present

## 2017-11-04 DIAGNOSIS — I6310 Cerebral infarction due to embolism of unspecified precerebral artery: Secondary | ICD-10-CM | POA: Diagnosis not present

## 2017-11-04 DIAGNOSIS — R29708 NIHSS score 8: Secondary | ICD-10-CM | POA: Diagnosis not present

## 2017-11-04 DIAGNOSIS — E042 Nontoxic multinodular goiter: Secondary | ICD-10-CM | POA: Diagnosis not present

## 2017-11-04 DIAGNOSIS — I712 Thoracic aortic aneurysm, without rupture: Secondary | ICD-10-CM | POA: Diagnosis present

## 2017-11-04 DIAGNOSIS — I700 Atherosclerosis of aorta: Secondary | ICD-10-CM | POA: Diagnosis present

## 2017-11-04 DIAGNOSIS — E785 Hyperlipidemia, unspecified: Secondary | ICD-10-CM | POA: Diagnosis not present

## 2017-11-04 DIAGNOSIS — G8191 Hemiplegia, unspecified affecting right dominant side: Secondary | ICD-10-CM | POA: Diagnosis not present

## 2017-11-04 DIAGNOSIS — I482 Chronic atrial fibrillation: Secondary | ICD-10-CM

## 2017-11-04 DIAGNOSIS — N390 Urinary tract infection, site not specified: Secondary | ICD-10-CM | POA: Diagnosis not present

## 2017-11-04 DIAGNOSIS — E041 Nontoxic single thyroid nodule: Secondary | ICD-10-CM

## 2017-11-04 DIAGNOSIS — Q2545 Double aortic arch: Secondary | ICD-10-CM | POA: Diagnosis not present

## 2017-11-04 DIAGNOSIS — I629 Nontraumatic intracranial hemorrhage, unspecified: Secondary | ICD-10-CM | POA: Diagnosis not present

## 2017-11-04 DIAGNOSIS — I639 Cerebral infarction, unspecified: Secondary | ICD-10-CM

## 2017-11-04 DIAGNOSIS — R29702 NIHSS score 2: Secondary | ICD-10-CM | POA: Diagnosis not present

## 2017-11-04 DIAGNOSIS — R29810 Facial weakness: Secondary | ICD-10-CM | POA: Diagnosis present

## 2017-11-04 DIAGNOSIS — Z823 Family history of stroke: Secondary | ICD-10-CM

## 2017-11-04 DIAGNOSIS — J189 Pneumonia, unspecified organism: Secondary | ICD-10-CM | POA: Diagnosis present

## 2017-11-04 DIAGNOSIS — I10 Essential (primary) hypertension: Secondary | ICD-10-CM | POA: Diagnosis present

## 2017-11-04 DIAGNOSIS — I6340 Cerebral infarction due to embolism of unspecified cerebral artery: Secondary | ICD-10-CM | POA: Diagnosis not present

## 2017-11-04 DIAGNOSIS — E876 Hypokalemia: Secondary | ICD-10-CM | POA: Diagnosis present

## 2017-11-04 DIAGNOSIS — R270 Ataxia, unspecified: Secondary | ICD-10-CM | POA: Diagnosis not present

## 2017-11-04 DIAGNOSIS — I361 Nonrheumatic tricuspid (valve) insufficiency: Secondary | ICD-10-CM | POA: Diagnosis not present

## 2017-11-04 DIAGNOSIS — Z86010 Personal history of colonic polyps: Secondary | ICD-10-CM

## 2017-11-04 DIAGNOSIS — Z8249 Family history of ischemic heart disease and other diseases of the circulatory system: Secondary | ICD-10-CM

## 2017-11-04 DIAGNOSIS — R4701 Aphasia: Secondary | ICD-10-CM | POA: Diagnosis not present

## 2017-11-04 DIAGNOSIS — I7122 Aneurysm of the aortic arch, without rupture: Secondary | ICD-10-CM

## 2017-11-04 DIAGNOSIS — R29709 NIHSS score 9: Secondary | ICD-10-CM | POA: Diagnosis not present

## 2017-11-04 DIAGNOSIS — E871 Hypo-osmolality and hyponatremia: Secondary | ICD-10-CM | POA: Diagnosis present

## 2017-11-04 DIAGNOSIS — R531 Weakness: Secondary | ICD-10-CM | POA: Diagnosis not present

## 2017-11-04 DIAGNOSIS — Z79899 Other long term (current) drug therapy: Secondary | ICD-10-CM

## 2017-11-04 DIAGNOSIS — R29717 NIHSS score 17: Secondary | ICD-10-CM | POA: Diagnosis not present

## 2017-11-04 DIAGNOSIS — I4891 Unspecified atrial fibrillation: Secondary | ICD-10-CM | POA: Diagnosis not present

## 2017-11-04 DIAGNOSIS — I6300 Cerebral infarction due to thrombosis of unspecified precerebral artery: Secondary | ICD-10-CM | POA: Diagnosis not present

## 2017-11-04 DIAGNOSIS — Z96641 Presence of right artificial hip joint: Secondary | ICD-10-CM | POA: Diagnosis present

## 2017-11-04 DIAGNOSIS — Z8673 Personal history of transient ischemic attack (TIA), and cerebral infarction without residual deficits: Secondary | ICD-10-CM

## 2017-11-04 DIAGNOSIS — Z888 Allergy status to other drugs, medicaments and biological substances status: Secondary | ICD-10-CM

## 2017-11-04 DIAGNOSIS — I63412 Cerebral infarction due to embolism of left middle cerebral artery: Secondary | ICD-10-CM

## 2017-11-04 DIAGNOSIS — J984 Other disorders of lung: Secondary | ICD-10-CM

## 2017-11-04 DIAGNOSIS — Z884 Allergy status to anesthetic agent status: Secondary | ICD-10-CM

## 2017-11-04 DIAGNOSIS — I6602 Occlusion and stenosis of left middle cerebral artery: Secondary | ICD-10-CM | POA: Diagnosis not present

## 2017-11-04 DIAGNOSIS — Z882 Allergy status to sulfonamides status: Secondary | ICD-10-CM

## 2017-11-04 DIAGNOSIS — Z886 Allergy status to analgesic agent status: Secondary | ICD-10-CM

## 2017-11-04 DIAGNOSIS — R000 Tachycardia, unspecified: Secondary | ICD-10-CM | POA: Diagnosis not present

## 2017-11-04 HISTORY — DX: Cerebral infarction, unspecified: I63.9

## 2017-11-04 LAB — PROTIME-INR
INR: 1.14
Prothrombin Time: 14.5 seconds (ref 11.4–15.2)

## 2017-11-04 LAB — URINALYSIS, ROUTINE W REFLEX MICROSCOPIC
BACTERIA UA: NONE SEEN
BILIRUBIN URINE: NEGATIVE
Glucose, UA: NEGATIVE mg/dL
Ketones, ur: NEGATIVE mg/dL
Leukocytes, UA: NEGATIVE
NITRITE: NEGATIVE
PH: 8 (ref 5.0–8.0)
Protein, ur: NEGATIVE mg/dL
SPECIFIC GRAVITY, URINE: 1.025 (ref 1.005–1.030)

## 2017-11-04 LAB — COMPREHENSIVE METABOLIC PANEL
ALBUMIN: 3.7 g/dL (ref 3.5–5.0)
ALK PHOS: 49 U/L (ref 38–126)
ALT: 13 U/L (ref 0–44)
ANION GAP: 11 (ref 5–15)
AST: 22 U/L (ref 15–41)
BUN: 5 mg/dL — ABNORMAL LOW (ref 8–23)
CALCIUM: 9.2 mg/dL (ref 8.9–10.3)
CO2: 27 mmol/L (ref 22–32)
Chloride: 91 mmol/L — ABNORMAL LOW (ref 98–111)
Creatinine, Ser: 0.66 mg/dL (ref 0.44–1.00)
GFR calc Af Amer: >60 mL/min (ref >60)
GFR calc non Af Amer: >60 mL/min (ref >60)
Glucose, Bld: 113 mg/dL — ABNORMAL HIGH (ref 70–99)
Potassium: 3.1 mmol/L — ABNORMAL LOW (ref 3.5–5.1)
SODIUM: 129 mmol/L — AB (ref 135–145)
Total Bilirubin: 1.2 mg/dL (ref 0.3–1.2)
Total Protein: 7.2 g/dL (ref 6.5–8.1)

## 2017-11-04 LAB — DIFFERENTIAL
ABS IMMATURE GRANULOCYTES: 0 10*3/uL (ref 0.0–0.1)
BASOS ABS: 0 10*3/uL (ref 0.0–0.1)
Basophils Relative: 1 %
EOS PCT: 1 %
Eosinophils Absolute: 0.1 10*3/uL (ref 0.0–0.7)
Immature Granulocytes: 0 %
LYMPHS PCT: 40 %
Lymphs Abs: 2.6 10*3/uL (ref 0.7–4.0)
Monocytes Absolute: 0.7 10*3/uL (ref 0.1–1.0)
Monocytes Relative: 11 %
NEUTROS ABS: 3 10*3/uL (ref 1.7–7.7)
Neutrophils Relative %: 47 %

## 2017-11-04 LAB — CBC
HEMATOCRIT: 41.3 % (ref 36.0–46.0)
HEMOGLOBIN: 13.6 g/dL (ref 12.0–15.0)
MCH: 28.9 pg (ref 26.0–34.0)
MCHC: 32.9 g/dL (ref 30.0–36.0)
MCV: 87.7 fL (ref 78.0–100.0)
Platelets: 211 10*3/uL (ref 150–400)
RBC: 4.71 MIL/uL (ref 3.87–5.11)
RDW: 12.7 % (ref 11.5–15.5)
WBC: 6.4 10*3/uL (ref 4.0–10.5)

## 2017-11-04 LAB — RAPID URINE DRUG SCREEN, HOSP PERFORMED
Amphetamines: NOT DETECTED
Barbiturates: NOT DETECTED
Benzodiazepines: NOT DETECTED
Cocaine: NOT DETECTED
OPIATES: NOT DETECTED
TETRAHYDROCANNABINOL: NOT DETECTED

## 2017-11-04 LAB — GLUCOSE, CAPILLARY: Glucose-Capillary: 125 mg/dL — ABNORMAL HIGH (ref 70–99)

## 2017-11-04 LAB — I-STAT CHEM 8, ED
BUN: 4 mg/dL — ABNORMAL LOW (ref 8–23)
CHLORIDE: 89 mmol/L — AB (ref 98–111)
CREATININE: 0.6 mg/dL (ref 0.44–1.00)
Calcium, Ion: 1.1 mmol/L — ABNORMAL LOW (ref 1.15–1.40)
GLUCOSE: 111 mg/dL — AB (ref 70–99)
HCT: 44 % (ref 36.0–46.0)
Hemoglobin: 15 g/dL (ref 12.0–15.0)
POTASSIUM: 3.1 mmol/L — AB (ref 3.5–5.1)
Sodium: 130 mmol/L — ABNORMAL LOW (ref 135–145)
TCO2: 27 mmol/L (ref 22–32)

## 2017-11-04 LAB — TSH: TSH: 2.726 u[IU]/mL (ref 0.350–4.500)

## 2017-11-04 LAB — I-STAT TROPONIN, ED: Troponin i, poc: 0 ng/mL (ref 0.00–0.08)

## 2017-11-04 LAB — ETHANOL: Alcohol, Ethyl (B): <10 mg/dL (ref <10)

## 2017-11-04 LAB — APTT: APTT: 33 s (ref 24–36)

## 2017-11-04 MED ORDER — ACETAMINOPHEN 160 MG/5ML PO SOLN: 650.0000 mg | ORAL | Status: DC | PRN

## 2017-11-04 MED ORDER — SENNOSIDES-DOCUSATE SODIUM 8.6-50 MG PO TABS: 1.0000 | ORAL_TABLET | Freq: Every evening | ORAL | Status: DC | PRN

## 2017-11-04 MED ORDER — STROKE: EARLY STAGES OF RECOVERY BOOK
Freq: Once | Status: AC
  Administered 2017-11-04: 18:00:00

## 2017-11-04 MED ORDER — ACETAMINOPHEN 650 MG RE SUPP: 650.0000 mg | RECTAL | Status: DC | PRN

## 2017-11-04 MED ORDER — METOCLOPRAMIDE HCL 5 MG/ML IJ SOLN
10.0000 mg | Freq: Once | INTRAMUSCULAR | Status: AC | PRN
  Administered 2017-11-04: 10 mg via INTRAVENOUS
  Filled 2017-11-04: qty 2

## 2017-11-04 MED ORDER — POTASSIUM CHLORIDE CRYS ER 20 MEQ PO TBCR
40.0000 meq | EXTENDED_RELEASE_TABLET | Freq: Once | ORAL | Status: AC
  Administered 2017-11-04: 40 meq via ORAL
  Filled 2017-11-04: qty 2

## 2017-11-04 MED ORDER — IOPAMIDOL (ISOVUE-370) INJECTION 76%
60.0000 mL | Freq: Once | INTRAVENOUS | Status: AC | PRN
  Administered 2017-11-04: 60 mL via INTRAVENOUS

## 2017-11-04 MED ORDER — SODIUM CHLORIDE 0.9 % IV SOLN
INTRAVENOUS | Status: AC
  Administered 2017-11-04: 18:00:00 via INTRAVENOUS

## 2017-11-04 MED ORDER — ACETAMINOPHEN 325 MG PO TABS: 650.0000 mg | ORAL_TABLET | ORAL | Status: DC | PRN

## 2017-11-04 NOTE — ED Notes (Signed)
Purewick placed on pt. 
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
## 2017-11-04 NOTE — ED Triage Notes (Signed)
Pt arriving via [facility] after she experienced left arm flaccid, right sided facial droop and slurred speech at the grocery store.  LKW 1210.  Pt in uncontrolled Afib with a rate between 80-140, pt is not on a blood thinner.  Pt alert and oriented.  BP 180/116 per EMS.

## 2017-11-04 NOTE — ED Notes (Signed)
ED TO INPATIENT HANDOFF REPORT  Name/Age/Gender Kayla Wilson 77 y.o. female  Code Status Code Status History    Date Active Date Inactive Code Status Order ID Comments User Context   09/03/2014 1845 09/05/2014 1524 Full Code [id number]  Caryn Section Inpatient   09/03/2014 1734 09/03/2014 1845 Full Code [id number]  Rush Farmer, MD Inpatient      Home/SNF/Other Home  Chief Complaint Code stroke  Level of Care/Admitting Diagnosis ED Disposition    ED Disposition Condition [facility]: [place] [100100]  Level of Care: Telemetry [5]  Diagnosis: CVA (cerebral vascular accident) [facility]) [638937]  Admitting Physician: Cristy Folks [[id number]]  Attending Physician: Cristy Folks [[id number]]  PT Class (Do Not Modify): Observation [104]  PT Acc Code (Do Not Modify): Observation [10022]       Medical History Past Medical History:  Diagnosis Date  . Atrial fibrillation ([place])   . Colon polyps   . Complication of anesthesia    difficulty awakening  . CVA (cerebral vascular accident) ([place]) 11/04/2017  . Diverticulosis   . Hypertension   . IATROGENIC CEREBROVASCULAR INFARCT/HEMORRHAGE NE 03/22/1993   Qualifier: Diagnosis of  By: Leanne Chang MD, [place] per pt's report-Dr Vertell Limber- hemorrhagic stroke. No intervention required    . Stroke [facility])     Allergies Allergies  Allergen Reactions  . Ace Inhibitors Anaphylaxis    Angioedema, perioperative  . Celebrex [Celecoxib] Itching and Other (See Comments)    Twitching   . Antihistamines, Chlorpheniramine-Type Other (See Comments)    afib  . Aspirin Other (See Comments)    REACTION: hx of hemorrhagic stroke  . Cortisone Other (See Comments)    "cortisone shoot made me jerk"  . Lidocaine Other (See Comments)    Pt was fine for ~2 days after she received medication and states she then got "sore all over and could not move"  . Phenytoin Swelling    mostly in arms  . Sulfonamide  Derivatives Itching    IV Location/Drains/Wounds Patient Lines/Drains/Airways Status   Active Line/Drains/Airways    Name:   Placement date:   Placement time:   Site:   Days:   Peripheral IV 11/04/17 Right Antecubital   11/04/17    1348    Antecubital   less than 1   Peripheral IV 11/04/17 Left Forearm   11/04/17    1349    Forearm   less than 1   External Urinary Catheter   11/04/17    1501    -   less than 1          Labs/Imaging Results for orders placed or performed during the hospital encounter of 11/04/17 (from the past 48 hour(s))  Ethanol     Status: None   Collection Time: 11/04/17  1:20 PM  Result Value Ref Range   Alcohol, Ethyl (B) <10 <10 mg/dL    Comment: (NOTE) Lowest detectable limit for serum alcohol is 10 mg/dL. For medical purposes only. Performed at [facility], [place] [address], [place], [place] [address]   Protime-INR     Status: None   Collection Time: 11/04/17  1:20 PM  Result Value Ref Range   Prothrombin Time 14.5 11.4 - 15.2 seconds   INR 1.14     Comment: Performed at [facility] [address] [place], [place] [address]  APTT     Status: None   Collection Time: 11/04/17  1:20  PM  Result Value Ref Range   aPTT 33 24 - 36 seconds    Comment: Performed at [facility], [place] [address] [place] [address]  CBC     Status: None   Collection Time: 11/04/17  1:20 PM  Result Value Ref Range   WBC 6.4 4.0 - 10.5 K/uL   RBC 4.71 3.87 - 5.11 MIL/uL   Hemoglobin 13.6 12.0 - 15.0 g/dL   HCT 41.3 36.0 - 46.0 %   MCV 87.7 78.0 - 100.0 fL   MCH 28.9 26.0 - 34.0 pg   MCHC 32.9 30.0 - 36.0 g/dL   RDW 12.7 11.5 - 15.5 %   Platelets 211 150 - 400 K/uL    Comment: Performed at [facility] [address], [place], [place] [address]  Differential     Status: None   Collection Time: 11/04/17  1:20 PM  Result Value Ref Range   Neutrophils Relative % 47 %   Neutro Abs 3.0 1.7 - 7.7 K/uL   Lymphocytes Relative 40 %   Lymphs  Abs 2.6 0.7 - 4.0 K/uL   Monocytes Relative 11 %   Monocytes Absolute 0.7 0.1 - 1.0 K/uL   Eosinophils Relative 1 %   Eosinophils Absolute 0.1 0.0 - 0.7 K/uL   Basophils Relative 1 %   Basophils Absolute 0.0 0.0 - 0.1 K/uL   Immature Granulocytes 0 %   Abs Immature Granulocytes 0.0 0.0 - 0.1 K/uL    Comment: Performed at [facility] [address] [place], [place] [address]  Comprehensive metabolic panel     Status: Abnormal   Collection Time: 11/04/17  1:20 PM  Result Value Ref Range   Sodium 129 (L) 135 - 145 mmol/L   Potassium 3.1 (L) 3.5 - 5.1 mmol/L   Chloride 91 (L) 98 - 111 mmol/L   CO2 27 22 - 32 mmol/L   Glucose, Bld 113 (H) 70 - 99 mg/dL   BUN 5 (L) 8 - 23 mg/dL   Creatinine, Ser 0.66 0.44 - 1.00 mg/dL   Calcium 9.2 8.9 - 10.3 mg/dL   Total Protein 7.2 6.5 - 8.1 g/dL   Albumin 3.7 3.5 - 5.0 g/dL   AST 22 15 - 41 U/L   ALT 13 0 - 44 U/L   Alkaline Phosphatase 49 38 - 126 U/L   Total Bilirubin 1.2 0.3 - 1.2 mg/dL   GFR calc non Af Amer >60 >60 mL/min   GFR calc Af Amer >60 >60 mL/min    Comment: (NOTE) The eGFR has been calculated using the CKD EPI equation. This calculation has not been validated in all clinical situations. eGFR's persistently <60 mL/min signify possible Chronic Kidney Disease.    Anion gap 11 5 - 15    Comment: Performed at [facility] [address], [place], [place] [address]  I-stat troponin, ED     Status: None   Collection Time: 11/04/17  1:22 PM  Result Value Ref Range   Troponin i, poc 0.00 0.00 - 0.08 ng/mL   Comment 3            Comment: Due to the release kinetics of cTnI, a negative result within the first hours of the onset of symptoms does not rule out myocardial infarction with certainty. If myocardial infarction is still suspected, repeat the test at appropriate intervals.   I-Stat Chem 8, ED     Status: Abnormal   Collection Time:  11/04/17  1:24 PM  Result Value Ref Range   Sodium 130 (L) 135 - 145 mmol/L    Potassium 3.1 (L) 3.5 - 5.1 mmol/L   Chloride 89 (L) 98 - 111 mmol/L   BUN 4 (L) 8 - 23 mg/dL   Creatinine, Ser 0.60 0.44 - 1.00 mg/dL   Glucose, Bld 111 (H) 70 - 99 mg/dL   Calcium, Ion 1.10 (L) 1.15 - 1.40 mmol/L   TCO2 27 22 - 32 mmol/L   Hemoglobin 15.0 12.0 - 15.0 g/dL   HCT 44.0 36.0 - 46.0 %   Ct Angio Head W Or Wo Contrast  Result Date: 11/04/2017 CLINICAL DATA:  77 y/o  F; code stroke. EXAM: CT ANGIOGRAPHY HEAD AND NECK TECHNIQUE: Multidetector CT imaging of the head and neck was performed using the standard protocol during bolus administration of intravenous contrast. Multiplanar CT image reconstructions and MIPs were obtained to evaluate the vascular anatomy. Carotid stenosis measurements (when applicable) are obtained utilizing NASCET criteria, using the distal internal carotid diameter as the denominator. 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 The aneurysm of the aorta is incompletely included within the field of view. Right carotid system: No evidence of dissection, stenosis (50% or greater) or occlusion. Calcified plaque of the right proximal ICA with mild less than 50% stenosis. Left carotid system: No evidence of dissection, stenosis (50% or greater) or occlusion. Vertebral arteries: Codominant. No evidence of dissection, stenosis (50% or greater) or occlusion. Skeleton: Moderate cervical spondylosis with multilevel disc and facet degenerative changes. No high-grade bony canal stenosis. No acute osseous abnormality is evident. Other neck: Multinodular thyroid goiter with the largest nodule measuring 3.8 x 3.7 cm in the axial plane (series 6, image 136). Upper chest: Pleuroparenchymal scarring in the lung apices bilaterally. Review of the MIP images confirms the above findings CTA HEAD FINDINGS Anterior  circulation: Calcified plaque of carotid siphons with mild bilateral paraclinoid stenosis. Mild distal right M1 stenosis. Proximal occlusion of the left M2 superior division within the sylvian fissure (series 10, image 141 and series 9, image 115). No additional large vessel occlusion, stenosis, aneurysm, or vascular malformation. Posterior circulation: No significant stenosis, proximal occlusion, aneurysm, or vascular malformation. Venous sinuses: As permitted by contrast timing, patent. Anatomic variants: Bilateral fetal PCA. Large right A1, large anterior communicating artery, diminutive left A1 normal variant. Review of the MIP images confirms the above findings IMPRESSION: 1. Proximal left M2 superior division occlusion within the sylvian fissure. 2. 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 Nonemergent CTA of the chest is recommended. 3. No additional large vessel occlusion, aneurysm, or hemodynamically significant stenosis by NASCET criteria. 4. Thyroid goiter with nodules measuring up to 3.8 cm. Nonemergent thyroid ultrasound is recommended. These results were called by telephone at the time of interpretation on 11/04/2017 at 1:48 pm to Dr. Rory Percy, who verbally acknowledged these results. Electronically Signed   By: Kristine Garbe M.D.   On: 11/04/2017 13:51   Ct Angio Neck W Or Wo Contrast  Result Date: 11/04/2017 CLINICAL DATA:  77 y/o  F; code stroke. EXAM: CT ANGIOGRAPHY HEAD AND NECK TECHNIQUE: Multidetector CT imaging of the head and neck was performed using the standard protocol during bolus administration of intravenous contrast. Multiplanar CT image reconstructions and MIPs were obtained to evaluate the  vascular anatomy. Carotid stenosis measurements (when applicable) are obtained utilizing NASCET criteria, using the distal internal carotid diameter as the denominator. 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 The aneurysm of the aorta is incompletely included within the field of view. Right carotid system: No evidence of dissection, stenosis (50% or greater) or occlusion. Calcified plaque of the right proximal ICA with mild less than 50% stenosis. Left carotid system: No evidence of dissection, stenosis (50% or greater) or occlusion. Vertebral arteries: Codominant. No evidence of dissection, stenosis (50% or greater) or occlusion. Skeleton: Moderate cervical spondylosis with multilevel disc and facet degenerative changes. No high-grade bony canal stenosis. No acute osseous abnormality is evident. Other neck: Multinodular thyroid goiter with the largest nodule measuring 3.8 x 3.7 cm in the axial plane (series 6, image 136). Upper chest: Pleuroparenchymal scarring in the lung apices bilaterally. Review of the MIP images confirms the above findings CTA HEAD FINDINGS Anterior circulation: Calcified plaque of carotid siphons with mild bilateral paraclinoid stenosis. Mild distal right M1 stenosis. Proximal occlusion of the left M2 superior division within the sylvian fissure (series 10, image 141 and series 9, image 115). No additional large vessel occlusion, stenosis, aneurysm, or vascular malformation. Posterior circulation: No significant stenosis, proximal occlusion, aneurysm, or vascular malformation. Venous sinuses: As permitted by contrast timing, patent. Anatomic variants: Bilateral fetal PCA. Large right A1, large anterior communicating artery, diminutive left A1 normal variant. Review of the MIP images confirms the above findings IMPRESSION: 1. Proximal left M2 superior division occlusion within the sylvian fissure. 2. 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 Nonemergent CTA of the chest is recommended. 3. No additional large vessel  occlusion, aneurysm, or hemodynamically significant stenosis by NASCET criteria. 4. Thyroid goiter with nodules measuring up to 3.8 cm. Nonemergent thyroid ultrasound is recommended. These results were called by telephone at the time of interpretation on 11/04/2017 at 1:48 pm to Dr. Rory Percy, who verbally acknowledged these results. Electronically Signed   By: Kristine Garbe M.D.   On: 11/04/2017 13:51   Ct Head Code Stroke Wo Contrast  Result Date: 11/04/2017 CLINICAL DATA:  Code stroke. 77 y/o F; left arm weakness and right facial droop. EXAM: CT HEAD WITHOUT CONTRAST TECHNIQUE: Contiguous axial images were obtained from the base of the skull through the vertex without intravenous contrast. COMPARISON:  None. FINDINGS: Brain: Small right inferolateral frontal lobe chronic infarction and small right posterolateral parietal lobe subacute to chronic infarctions are present. There are advanced chronic microvascular ischemic changes of white matter and moderate volume loss of the brain. No acute stroke, hemorrhage, focal mass effect, extra-axial collection, or herniation. Vascular: Calcific atherosclerosis of the carotid siphons. No hyperdense vessel identified. Skull: Normal. Negative for fracture or focal lesion. Sinuses/Orbits: No acute finding. Other: None. ASPECTS [facility] Stroke Program Early CT Score) - Ganglionic level infarction (caudate, lentiform nuclei, internal capsule, insula, M1-M3 cortex): 7 - Supraganglionic infarction (M4-M6 cortex): 3 Total score (0-10 with 10 being normal): 10 IMPRESSION: 1. No acute stroke, hemorrhage, or mass effect. 2. Small chronic infarct in right frontal lobe and small subacute to chronic infarct in the right parietal lobe. 3. Advanced chronic microvascular ischemic changes and moderate volume loss of the brain. 4. ASPECTS is 10 These results were called by telephone at the time of interpretation on  11/04/2017 at 1:31 pm to Dr. Rory Percy, who verbally acknowledged these  results. Electronically Signed   By: Kristine Garbe M.D.   On: 11/04/2017 13:38    Pending Labs Unresulted Labs (From admission, onward)    Start     Ordered   11/04/17 1318  Urine rapid drug screen (hosp performed)  STAT,   STAT     11/04/17 1317   11/04/17 1318  Urinalysis, Routine w reflex microscopic  STAT,   STAT     11/04/17 1317   Signed and Held  Hemoglobin A1c  Tomorrow morning,   R     Signed and Held   Signed and Held  Lipid panel  Tomorrow morning,   R    Comments:  Fasting    Signed and Held   Signed and Held  TSH  Once,   R     Signed and Held          Vitals/Pain Today's Vitals   11/04/17 1445 11/04/17 1500 11/04/17 1511 11/04/17 1515  BP: (!) 146/95 (!) 166/103  (!) 131/97  Pulse: 70   92  Resp: 17 (!) 23  16  Temp:   98.1 F (36.7 C)   TempSrc:      SpO2: (!) 88%   97%  Weight:      Height:      PainSc:        Isolation Precautions No active isolations  Medications Medications  iopamidol (ISOVUE-370) 76 % injection 60 mL (60 mLs Intravenous Contrast Given 11/04/17 1340)    Mobility walks

## 2017-11-04 NOTE — H&P (Addendum)
History and Physical    Kayla Wilson NGE:[id number] DOB: 12/28/1940 DOA: 11/04/2017  PCP: Dorothyann Peng, NP  Patient coming from: home   Chief Complaint: facial weakness  HPI: Kayla Wilson is a 77 y.o. female with medical history significant of hypertension, chronic atrial fibrillation not on anticoagulation due to remote history of cerebral hemorrhage in 1996 who presents with right-sided facial droop and resolved left-sided weakness.  Patient reports that she was shopping at [employer] and her grandson noticed that she had some right facial drooping.  She also apparently had some left arm weakness that completely resolved.  She denies this.  She denies any change in swallowing or her speech.  She denied any chest pain, palpitations, nausea, vomiting, diarrhea.  She denies any cough, congestion, rhinorrhea.  EMS was called.  ED Course: In the ED patient's vitals are notable for hypertension.  Patient was intermittently tachycardic with A. fib with RVR.  Labs were notable for hypokalemia, hyponatremia.  Head CT was negative.  CTA of the head showed a proximal left M2 occlusion in the sylvian fissure.  A thyroid goiter with nodules was also incidentally noted.  Patient apparently refused TPA and has refused any anticoagulation in the past due to her history of intracranial hemorrhage.  She was admitted for further monitoring.  Old strokes were noted on head CT.  Review of Systems: As per HPI otherwise 10 point review of systems negative.   Past Medical History:  Diagnosis Date  . Atrial fibrillation ([place])   . Colon polyps   . Complication of anesthesia    difficulty awakening  . CVA (cerebral vascular accident) ([place]) 11/04/2017  . Diverticulosis   . Hypertension   . IATROGENIC CEREBROVASCULAR INFARCT/HEMORRHAGE NE 03/22/1993   Qualifier: Diagnosis of  By: Leanne Chang MD, [place] per pt's report-Dr Vertell Limber- hemorrhagic stroke. No intervention required    . Stroke [facility])     Past Surgical  History:  Procedure Laterality Date  . APPENDECTOMY    . EYE SURGERY Left    vitrectomy  . TOTAL HIP ARTHROPLASTY Right 09/03/2014   Procedure: RIGHT TOTAL HIP ARTHROPLASTY ANTERIOR APPROACH;  Surgeon: Renette Butters, MD;  Location: [place];  Service: Orthopedics;  Laterality: Right;  . TUBAL LIGATION       reports that she has never smoked. She has never used smokeless tobacco. She reports that she does not drink alcohol or use drugs.  Allergies  Allergen Reactions  . Ace Inhibitors Anaphylaxis    Angioedema, perioperative  . Celebrex [Celecoxib] Itching and Other (See Comments)    Twitching   . Antihistamines, Chlorpheniramine-Type Other (See Comments)    afib  . Aspirin Other (See Comments)    REACTION: hx of hemorrhagic stroke  . Cortisone Other (See Comments)    "cortisone shoot made me jerk"  . Lidocaine Other (See Comments)    Pt was fine for ~2 days after she received medication and states she then got "sore all over and could not move"  . Phenytoin Swelling    mostly in arms  . Sulfonamide Derivatives Itching    Family History  Problem Relation Age of Onset  . Diabetes Brother   . Heart disease Brother   . Hypertension Brother   . Stroke Mother   . Hypertension Mother   . Heart disease Father        CABG, MI  . Cancer Father        skin   Unacceptable: Noncontributory, unremarkable, or  negative. Acceptable: Family history reviewed and not pertinent (If you reviewed it)  Prior to Admission medications   Medication Sig Start Date End Date Taking? Authorizing Provider  carvedilol (COREG) 12.5 MG tablet Take 1 tablet (12.5 mg total) by mouth 2 (two) times daily. 02/16/17   Nafziger, Tommi Rumps, NP  EPINEPHrine (EPIPEN 2-PAK) 0.3 mg/0.3 mL IJ SOAJ injection Inject 0.3 mLs (0.3 mg total) into the muscle once. As needed for anaphylaxis - lip / mouth swelling, difficulty breathing.  Call 911 if injected. 09/05/14   Donita Brooks, NP  hydrochlorothiazide (HYDRODIURIL) 25  MG tablet Take 1 tablet (25 mg total) by mouth daily. 02/16/17   Nafziger, Tommi Rumps, NP  vitamin B-12 (CYANOCOBALAMIN) 500 MCG tablet Take 500 mcg by mouth daily.    [provider]    Physical Exam: Vitals:   11/04/17 1350 11/04/17 1400 11/04/17 1415 11/04/17 1430  BP: (!) 164/85 (!) 173/141 (!) 177/93 (!) 160/118  Pulse: 93 84 93   Resp: 14 16 18  (!) 25  Temp:      TempSrc:      SpO2: (!) 88% 92% 95%   Weight:      Height:        Constitutional: NAD, calm, comfortable Vitals:   11/04/17 1350 11/04/17 1400 11/04/17 1415 11/04/17 1430  BP: (!) 164/85 (!) 173/141 (!) 177/93 (!) 160/118  Pulse: 93 84 93   Resp: 14 16 18  (!) 25  Temp:      TempSrc:      SpO2: (!) 88% 92% 95%   Weight:      Height:       Eyes: Pupils equally round and reactive to light, anicteric sclera, extraocular muscles intact ENMT: Mucous membranes are moist.  Neck: normal, supple, mild thyromegaly Respiratory: clear to auscultation bilaterally, no wheezing, no crackles. Normal respiratory effort. No accessory muscle use.  Cardiovascular: Tachycardic, irregularly irregular, no murmurs Abdomen: no tenderness, no masses palpated. No hepatosplenomegaly. Bowel sounds positive.  Musculoskeletal: Lower extremity edema.  Skin: Chronic lower extremity skin changes Neurologic: Right-sided facial drooping noted. Sensation intact, DTR normal. Strength 5/5 in all 4.  Psychiatric: Normal judgment and insight. Alert and oriented x 3. Normal mood.    Labs on Admission: I have personally reviewed following labs and imaging studies  CBC: Recent Labs  Lab 11/04/17 1320 11/04/17 1324  WBC 6.4  --   NEUTROABS 3.0  --   HGB 13.6 15.0  HCT 41.3 44.0  MCV 87.7  --   PLT 211  --    Basic Metabolic Panel: Recent Labs  Lab 11/04/17 1320 11/04/17 1324  NA 129* 130*  K 3.1* 3.1*  CL 91* 89*  CO2 27  --   GLUCOSE 113* 111*  BUN 5* 4*  CREATININE 0.66 0.60  CALCIUM 9.2  --    GFR: Estimated Creatinine  Clearance: 54 mL/min (by C-G formula based on SCr of 0.6 mg/dL). Liver Function Tests: Recent Labs  Lab 11/04/17 1320  AST 22  ALT 13  ALKPHOS 49  BILITOT 1.2  PROT 7.2  ALBUMIN 3.7   No results for input(s): LIPASE, AMYLASE in the last 168 hours. No results for input(s): AMMONIA in the last 168 hours. Coagulation Profile: Recent Labs  Lab 11/04/17 1320  INR 1.14   Cardiac Enzymes: No results for input(s): CKTOTAL, CKMB, CKMBINDEX, TROPONINI in the last 168 hours. BNP (last 3 results) No results for input(s): PROBNP in the last 8760 hours. HbA1C: No results for input(s): HGBA1C in  the last 72 hours. CBG: No results for input(s): GLUCAP in the last 168 hours. Lipid Profile: No results for input(s): CHOL, HDL, LDLCALC, TRIG, CHOLHDL, LDLDIRECT in the last 72 hours. Thyroid Function Tests: No results for input(s): TSH, T4TOTAL, FREET4, T3FREE, THYROIDAB in the last 72 hours. Anemia Panel: No results for input(s): VITAMINB12, FOLATE, FERRITIN, TIBC, IRON, RETICCTPCT in the last 72 hours. Urine analysis:    Component Value Date/Time   COLORURINE YELLOW 08/23/2014 1150   APPEARANCEUR CLEAR 08/23/2014 1150   LABSPEC 1.007 08/23/2014 1150   PHURINE 7.0 08/23/2014 1150   GLUCOSEU NEGATIVE 08/23/2014 1150   HGBUR NEGATIVE 08/23/2014 1150   HGBUR trace-intact 11/13/2008 0902   BILIRUBINUR n 12/31/2015 [place] 08/23/2014 1150   PROTEINUR trace 12/31/2015 1257   PROTEINUR NEGATIVE 08/23/2014 1150   UROBILINOGEN 0.2 12/31/2015 1257   UROBILINOGEN 0.2 08/23/2014 1150   NITRITE n 12/31/2015 1257   NITRITE NEGATIVE 08/23/2014 1150   LEUKOCYTESUR moderate (2+) (A) 12/31/2015 1257    Radiological Exams on Admission: Ct Angio Head W Or Wo Contrast  Result Date: 11/04/2017 CLINICAL DATA:  77 y/o  F; code stroke. EXAM: CT ANGIOGRAPHY HEAD AND NECK TECHNIQUE: Multidetector CT imaging of the head and neck was performed using the standard protocol during bolus  administration of intravenous contrast. Multiplanar CT image reconstructions and MIPs were obtained to evaluate the vascular anatomy. Carotid stenosis measurements (when applicable) are obtained utilizing NASCET criteria, using the distal internal carotid diameter as the denominator. 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 The aneurysm of the aorta is incompletely included within the field of view. Right carotid system: No evidence of dissection, stenosis (50% or greater) or occlusion. Calcified plaque of the right proximal ICA with mild less than 50% stenosis. Left carotid system: No evidence of dissection, stenosis (50% or greater) or occlusion. Vertebral arteries: Codominant. No evidence of dissection, stenosis (50% or greater) or occlusion. Skeleton: Moderate cervical spondylosis with multilevel disc and facet degenerative changes. No high-grade bony canal stenosis. No acute osseous abnormality is evident. Other neck: Multinodular thyroid goiter with the largest nodule measuring 3.8 x 3.7 cm in the axial plane (series 6, image 136). Upper chest: Pleuroparenchymal scarring in the lung apices bilaterally. Review of the MIP images confirms the above findings CTA HEAD FINDINGS Anterior circulation: Calcified plaque of carotid siphons with mild bilateral paraclinoid stenosis. Mild distal right M1 stenosis. Proximal occlusion of the left M2 superior division within the sylvian fissure (series 10, image 141 and series 9, image 115). No additional large vessel occlusion, stenosis, aneurysm, or vascular malformation. Posterior circulation: No significant stenosis, proximal occlusion, aneurysm, or vascular malformation. Venous sinuses: As permitted by contrast timing, patent. Anatomic variants: Bilateral fetal PCA. Large right A1, large  anterior communicating artery, diminutive left A1 normal variant. Review of the MIP images confirms the above findings IMPRESSION: 1. Proximal left M2 superior division occlusion within the sylvian fissure. 2. 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 Nonemergent CTA of the chest is recommended. 3. No additional large vessel occlusion, aneurysm, or hemodynamically significant stenosis by NASCET criteria. 4. Thyroid goiter with nodules measuring up to 3.8 cm. Nonemergent thyroid ultrasound is recommended. These results were called by telephone at the time of interpretation on 11/04/2017 at 1:48 pm to Dr. Rory Percy,  who verbally acknowledged these results. Electronically Signed   By: Kristine Garbe M.D.   On: 11/04/2017 13:51   Ct Angio Neck W Or Wo Contrast  Result Date: 11/04/2017 CLINICAL DATA:  78 y/o  F; code stroke. EXAM: CT ANGIOGRAPHY HEAD AND NECK TECHNIQUE: Multidetector CT imaging of the head and neck was performed using the standard protocol during bolus administration of intravenous contrast. Multiplanar CT image reconstructions and MIPs were obtained to evaluate the vascular anatomy. Carotid stenosis measurements (when applicable) are obtained utilizing NASCET criteria, using the distal internal carotid diameter as the denominator. 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 The aneurysm of the aorta is incompletely included within the field of view. Right carotid system: No evidence of dissection, stenosis (50% or greater) or occlusion. Calcified plaque of the right proximal ICA with mild less than 50% stenosis. Left carotid system: No evidence of dissection, stenosis (50% or greater) or occlusion. Vertebral arteries: Codominant. No evidence of dissection, stenosis (50% or greater)  or occlusion. Skeleton: Moderate cervical spondylosis with multilevel disc and facet degenerative changes. No high-grade bony canal stenosis. No acute osseous abnormality is evident. Other neck: Multinodular thyroid goiter with the largest nodule measuring 3.8 x 3.7 cm in the axial plane (series 6, image 136). Upper chest: Pleuroparenchymal scarring in the lung apices bilaterally. Review of the MIP images confirms the above findings CTA HEAD FINDINGS Anterior circulation: Calcified plaque of carotid siphons with mild bilateral paraclinoid stenosis. Mild distal right M1 stenosis. Proximal occlusion of the left M2 superior division within the sylvian fissure (series 10, image 141 and series 9, image 115). No additional large vessel occlusion, stenosis, aneurysm, or vascular malformation. Posterior circulation: No significant stenosis, proximal occlusion, aneurysm, or vascular malformation. Venous sinuses: As permitted by contrast timing, patent. Anatomic variants: Bilateral fetal PCA. Large right A1, large anterior communicating artery, diminutive left A1 normal variant. Review of the MIP images confirms the above findings IMPRESSION: 1. Proximal left M2 superior division occlusion within the sylvian fissure. 2. 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 Nonemergent CTA of the chest is recommended. 3. No additional large vessel occlusion, aneurysm, or hemodynamically significant stenosis by NASCET criteria. 4. Thyroid goiter with nodules measuring up to 3.8 cm. Nonemergent thyroid ultrasound is recommended. These results were called by telephone at the time of interpretation on 11/04/2017 at 1:48 pm to Dr. Rory Percy, who verbally acknowledged these results. Electronically Signed   By: Kristine Garbe M.D.   On: 11/04/2017 13:51   Ct Head Code Stroke Wo Contrast  Result Date: 11/04/2017 CLINICAL DATA:  Code stroke. 77 y/o F; left arm weakness and right facial droop. EXAM: CT  HEAD WITHOUT CONTRAST TECHNIQUE: Contiguous axial images were obtained from the base of the skull through the vertex without intravenous contrast. COMPARISON:  None. FINDINGS: Brain: Small right inferolateral frontal lobe chronic infarction and small right posterolateral parietal lobe subacute to chronic infarctions are present. There are advanced chronic microvascular ischemic changes of white matter and moderate volume loss of the brain. No acute stroke, hemorrhage, focal mass effect, extra-axial collection, or herniation. Vascular: Calcific atherosclerosis of the carotid siphons. No hyperdense vessel identified. Skull: Normal. Negative for fracture or focal lesion. Sinuses/Orbits: No acute finding. Other: None. ASPECTS [facility] Stroke Program Early CT Score) - Ganglionic level infarction (caudate,  lentiform nuclei, internal capsule, insula, M1-M3 cortex): 7 - Supraganglionic infarction (M4-M6 cortex): 3 Total score (0-10 with 10 being normal): 10 IMPRESSION: 1. No acute stroke, hemorrhage, or mass effect. 2. Small chronic infarct in right frontal lobe and small subacute to chronic infarct in the right parietal lobe. 3. Advanced chronic microvascular ischemic changes and moderate volume loss of the brain. 4. ASPECTS is 10 These results were called by telephone at the time of interpretation on 11/04/2017 at 1:31 pm to Dr. Rory Percy, who verbally acknowledged these results. Electronically Signed   By: Kristine Garbe M.D.   On: 11/04/2017 13:38    EKG: Independently reviewed.  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  Old strokes additionally are noted unfortunately the patient continues to refuse any anticoagulation either with a novel oral anticoagulant, warfarin, or even an aspirin.  Her chads vas score is  quite elevated. -Bedside speech eval by RN -Telemetry -Echo ordered -MRI brain ordered -Neurology following - Hemoglobin A1c and lipid panel ordered, consider starting statin  #) Chronic atrial fibrillation: Per above patient is refusing to coagulation -Hold home carvedilol  #) Hypertension: Permissive hypertension - Hold home carvedilol 12.5 mg twice daily -Hold home HCTZ 25 mg daily  #) Thyroid goiter: Noted on ultrasound -TSH pending - Outpatient thyroid ultrasound necessary  Fluids: Tolerating p.o. Electrodes: Monitor and supplement Nutrition: Heart healthy diet  Prophylaxis: SCDs as patient refuses anticoagulation  Disposition: Pending stroke evaluation completion  Full code    Cristy Folks MD [facility]  If 7PM-7AM, please contact night-coverage [email or web address] Password [email or web address]  11/04/2017, 2:40 PM

## 2017-11-04 NOTE — ED Provider Notes (Signed)
Emergency Department Provider Note   I have reviewed the triage vital signs and the nursing notes.   HISTORY  Chief Complaint Code Stroke   HPI Kayla Wilson is a 77 y.o. female with PMH of chronic a-fib not anticoagulated and HTN's to the emergency department for evaluation of acute onset right face weakness at approximately 12:10 PM today.  Patient states she had sudden onset right face droop with some speech change.  He also noted some left arm weakness which has improved lasted for only 3 minutes.  Patient arrived as a code stroke.  She does have a remote history of intracerebral hemorrhage and has refused anticoagulation for her A. fib because of this.  Denies any chest pain, heart palpitations, shortness of breath.   Past Medical History:  Diagnosis Date  . Atrial fibrillation ([place])   . Colon polyps   . Complication of anesthesia    difficulty awakening  . CVA (cerebral vascular accident) ([place]) 11/04/2017  . Diverticulosis   . Hypertension   . 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 No intervention required    . Stroke [facility])     Patient Active Problem List   Diagnosis Date Noted  . CVA (cerebral vascular accident) ([place]) 11/04/2017  . 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 DJD (degenerative joint disease) 09/03/2014  . Angioedema   . Urticaria   . Allergic reaction to serum, anaphylactic shock   . Chronic systolic dysfunction of left ventricle 11/14/2013  . Atrial fibrillation ([place]) 09/26/2013  . UNSPECIFIED VENOUS INSUFFICIENCY 11/21/2008  . PSORIASIS 02/16/2008  . 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 DIVERTICULOSIS, COLON 01/12/2007  . COLONIC POLYPS, HX OF 01/12/2007  . Essential hypertension 11/15/2006    Past Surgical History:  Procedure Laterality Date  . APPENDECTOMY    . EYE SURGERY Left    vitrectomy    . TOTAL HIP ARTHROPLASTY Right 09/03/2014   Procedure: RIGHT TOTAL HIP ARTHROPLASTY ANTERIOR APPROACH;  Surgeon: Renette Butters, MD;  Location: [place];  Service: Orthopedics;  Laterality: Right;  . TUBAL LIGATION     Allergies Ace inhibitors; Celebrex [celecoxib]; Antihistamines, chlorpheniramine-type; Aspirin; Cortisone; Lidocaine; Phenytoin; and Sulfonamide derivatives  Family History  Problem Relation Age of Onset  . Diabetes Brother   . Heart disease Brother   . Hypertension Brother   . Stroke Mother   . Hypertension Mother   . Heart disease Father        CABG, MI  . Cancer Father        skin    Social History Social History   Tobacco Use  . Smoking status: Never Smoker  . Smokeless tobacco: Never Used  Substance Use Topics  . Alcohol use: No  . Drug use: No    Review of Systems  Constitutional: No fever/chills Eyes: No visual changes. ENT: No sore throat. Cardiovascular: Denies chest pain. Respiratory: Denies shortness of breath. Gastrointestinal: No abdominal pain.  No nausea, no vomiting.  No diarrhea.  No constipation. Genitourinary: Negative for dysuria. Musculoskeletal: Negative for back pain. Skin: Negative for rash. Neurological: Negative for headaches. Positive right face weakness and voice change.   10-point ROS otherwise negative.  ____________________________________________   PHYSICAL EXAM:  VITAL SIGNS: ED Triage Vitals  Enc Vitals Group     BP 11/04/17 1344 (!) 172/110     Pulse Rate 11/04/17 1344 (!) 110  Resp 11/04/17 1344 16     Temp 11/04/17 1344 98.1 F (36.7 C)     Temp Source 11/04/17 1344 Oral     SpO2 11/04/17 1344 96 %     Weight 11/04/17 1300 128 lb 1.4 oz (58.1 kg)     Height 11/04/17 1347 5\' 8"  (1.727 m)     Pain Score 11/04/17 1347 0   Constitutional: Alert and oriented. Well appearing and in no acute distress. Eyes: Conjunctivae are normal. PERRL. EOMI. Head: Atraumatic. Nose: No  congestion/rhinnorhea. Mouth/Throat: Mucous membranes are moist.  Neck: No stridor.   Cardiovascular: Normal rate, regular rhythm. Good peripheral circulation. Grossly normal heart sounds.   Respiratory: Normal respiratory effort.  No retractions. Lungs CTAB. Gastrointestinal: Soft and nontender. No distention.  Musculoskeletal: No lower extremity tenderness nor edema. No gross deformities of extremities. Neurologic:  Normal speech and language. Right face droop noted with mild dysarthria. Normal upper and lower extremity strength. Normal sensation. NIHSS 2.  Skin:  Skin is warm, dry and intact. No rash noted.  ____________________________________________   LABS (all labs ordered are listed, but only abnormal results are displayed)  Labs Reviewed  COMPREHENSIVE METABOLIC PANEL - Abnormal; Notable for the following components:      Result Value   Sodium 129 (*)    Potassium 3.1 (*)    Chloride 91 (*)    Glucose, Bld 113 (*)    BUN 5 (*)    All other components within normal limits  I-STAT CHEM 8, ED - Abnormal; Notable for the following components:   Sodium 130 (*)    Potassium 3.1 (*)    Chloride 89 (*)    BUN 4 (*)    Glucose, Bld 111 (*)    Calcium, Ion 1.10 (*)    All other components within normal limits  ETHANOL  PROTIME-INR  APTT  CBC  DIFFERENTIAL  RAPID URINE DRUG SCREEN, HOSP PERFORMED  URINALYSIS, ROUTINE W REFLEX MICROSCOPIC  I-STAT TROPONIN, ED   ____________________________________________  EKG   EKG Interpretation  Date/Time:  Friday November 04 2017 13:41:10 EDT Ventricular Rate:  115 PR Interval:    QRS Duration: 102 QT Interval:  349 QTC Calculation: 483 R Axis:   82 Text Interpretation:  Atrial fibrillation Ventricular premature complex Borderline right axis deviation Borderline ST depression, diffuse leads No STEMI  Confirmed by Nanda Quinton [phone]) on 11/04/2017 1:51:18 PM       ____________________________________________  RADIOLOGY  Ct  Angio Head W Or Wo Contrast  Result Date: 11/04/2017 CLINICAL DATA:  77 y/o  F; code stroke. EXAM: CT ANGIOGRAPHY HEAD AND NECK TECHNIQUE: Multidetector CT imaging of the head and neck was performed using the standard protocol during bolus administration of intravenous contrast. Multiplanar CT image reconstructions and MIPs were obtained to evaluate the vascular anatomy. Carotid stenosis measurements (when applicable) are obtained utilizing NASCET criteria, using the distal internal carotid diameter as the denominator. 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 The aneurysm of the aorta is incompletely included within the field of view. Right carotid system: No evidence of dissection, stenosis (50% or greater) or occlusion. Calcified plaque of the right proximal ICA with mild less than 50% stenosis. Left carotid system: No evidence of dissection, stenosis (50% or greater) or occlusion. Vertebral arteries: Codominant. No evidence of dissection, stenosis (50% or  greater) or occlusion. Skeleton: Moderate cervical spondylosis with multilevel disc and facet degenerative changes. No high-grade bony canal stenosis. No acute osseous abnormality is evident. Other neck: Multinodular thyroid goiter with the largest nodule measuring 3.8 x 3.7 cm in the axial plane (series 6, image 136). Upper chest: Pleuroparenchymal scarring in the lung apices bilaterally. Review of the MIP images confirms the above findings CTA HEAD FINDINGS Anterior circulation: Calcified plaque of carotid siphons with mild bilateral paraclinoid stenosis. Mild distal right M1 stenosis. Proximal occlusion of the left M2 superior division within the sylvian fissure (series 10, image 141 and series 9, image 115). No additional large vessel occlusion, stenosis, aneurysm, or vascular  malformation. Posterior circulation: No significant stenosis, proximal occlusion, aneurysm, or vascular malformation. Venous sinuses: As permitted by contrast timing, patent. Anatomic variants: Bilateral fetal PCA. Large right A1, large anterior communicating artery, diminutive left A1 normal variant. Review of the MIP images confirms the above findings IMPRESSION: 1. Proximal left M2 superior division occlusion within the sylvian fissure. 2. 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 Nonemergent CTA of the chest is recommended. 3. No additional large vessel occlusion, aneurysm, or hemodynamically significant stenosis by NASCET criteria. 4. Thyroid goiter with nodules measuring up to 3.8 cm. Nonemergent thyroid ultrasound is recommended. These results were called by telephone at the time of interpretation on 11/04/2017 at 1:48 pm to Dr. Rory Percy, who verbally acknowledged these results. Electronically Signed   By: Kristine Garbe M.D.   On: 11/04/2017 13:51   Ct Angio Neck W Or Wo Contrast  Result Date: 11/04/2017 CLINICAL DATA:  77 y/o  F; code stroke. EXAM: CT ANGIOGRAPHY HEAD AND NECK TECHNIQUE: Multidetector CT imaging of the head and neck was performed using the standard protocol during bolus administration of intravenous contrast. Multiplanar CT image reconstructions and MIPs were obtained to evaluate the vascular anatomy. Carotid stenosis measurements (when applicable) are obtained utilizing NASCET criteria, using the distal internal carotid diameter as the denominator. 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 The aneurysm of the aorta is incompletely included within the field of view. Right carotid system: No evidence of dissection, stenosis (50% or greater) or occlusion. Calcified  plaque of the right proximal ICA with mild less than 50% stenosis. Left carotid system: No evidence of dissection, stenosis (50% or greater) or occlusion. Vertebral arteries: Codominant. No evidence of dissection, stenosis (50% or greater) or occlusion. Skeleton: Moderate cervical spondylosis with multilevel disc and facet degenerative changes. No high-grade bony canal stenosis. No acute osseous abnormality is evident. Other neck: Multinodular thyroid goiter with the largest nodule measuring 3.8 x 3.7 cm in the axial plane (series 6, image 136). Upper chest: Pleuroparenchymal scarring in the lung apices bilaterally. Review of the MIP images confirms the above findings CTA HEAD FINDINGS Anterior circulation: Calcified plaque of carotid siphons with mild bilateral paraclinoid stenosis. Mild distal right M1 stenosis. Proximal occlusion of the left M2 superior division within the sylvian fissure (series 10, image 141 and series 9, image 115). No additional large vessel occlusion, stenosis, aneurysm, or vascular malformation. Posterior circulation: No significant stenosis, proximal occlusion, aneurysm, or vascular malformation. Venous sinuses: As permitted by contrast timing, patent. Anatomic variants: Bilateral fetal PCA. Large right A1, large anterior communicating artery, diminutive left A1 normal variant. Review of the MIP images confirms  the above findings IMPRESSION: 1. Proximal left M2 superior division occlusion within the sylvian fissure. 2. 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 Nonemergent CTA of the chest is recommended. 3. No additional large vessel occlusion, aneurysm, or hemodynamically significant stenosis by NASCET criteria. 4. Thyroid goiter with nodules measuring up to 3.8 cm. Nonemergent thyroid ultrasound is recommended. These results were called by telephone at the time of interpretation on 11/04/2017 at 1:48 pm to Dr. Rory Percy, who verbally acknowledged these  results. Electronically Signed   By: Kristine Garbe M.D.   On: 11/04/2017 13:51   Ct Head Code Stroke Wo Contrast  Result Date: 11/04/2017 CLINICAL DATA:  Code stroke. 77 y/o F; left arm weakness and right facial droop. EXAM: CT HEAD WITHOUT CONTRAST TECHNIQUE: Contiguous axial images were obtained from the base of the skull through the vertex without intravenous contrast. COMPARISON:  None. FINDINGS: Brain: Small right inferolateral frontal lobe chronic infarction and small right posterolateral parietal lobe subacute to chronic infarctions are present. There are advanced chronic microvascular ischemic changes of white matter and moderate volume loss of the brain. No acute stroke, hemorrhage, focal mass effect, extra-axial collection, or herniation. Vascular: Calcific atherosclerosis of the carotid siphons. No hyperdense vessel identified. Skull: Normal. Negative for fracture or focal lesion. Sinuses/Orbits: No acute finding. Other: None. ASPECTS [facility] Stroke Program Early CT Score) - Ganglionic level infarction (caudate, lentiform nuclei, internal capsule, insula, M1-M3 cortex): 7 - Supraganglionic infarction (M4-M6 cortex): 3 Total score (0-10 with 10 being normal): 10 IMPRESSION: 1. No acute stroke, hemorrhage, or mass effect. 2. Small chronic infarct in right frontal lobe and small subacute to chronic infarct in the right parietal lobe. 3. Advanced chronic microvascular ischemic changes and moderate volume loss of the brain. 4. ASPECTS is 10 These results were called by telephone at the time of interpretation on 11/04/2017 at 1:31 pm to Dr. Rory Percy, who verbally acknowledged these results. Electronically Signed   By: Kristine Garbe M.D.   On: 11/04/2017 13:38    ____________________________________________   PROCEDURES  Procedure(s) performed:   Procedures  None ____________________________________________   INITIAL IMPRESSION / ASSESSMENT AND PLAN / ED COURSE  Pertinent  labs & imaging results that were available during my care of the patient were reviewed by me and considered in my medical decision making (see chart for details).  Patient presents to the emergency department as a code stroke with acute onset right face weakness and dysarthria.  She had left arm weakness as well that resolved after approximately 3 minutes.  Last seen normal 12:10 PM.  She has refused tPA. With left arm and right face symptoms there is some concern for showering clots. Discussed case with Dr. Malen Gauze who recommends MRI and CVA w/u. Recommends stepdown ICU for now.   Discussed patient's case with Hospitalist to request admission. Patient and family (if present) updated with plan. Care transferred to Hospitalist service.  I reviewed all nursing notes, vitals, pertinent old records, EKGs, labs, imaging (as available).  ____________________________________________  FINAL CLINICAL IMPRESSION(S) / ED DIAGNOSES  Final diagnoses:  Cerebrovascular accident (CVA), unspecified mechanism ([place])    MEDICATIONS GIVEN DURING THIS VISIT:  Medications  iopamidol (ISOVUE-370) 76 % injection 60 mL (60 mLs Intravenous Contrast Given 11/04/17 1340)    Note:  This document was prepared using Dragon voice recognition software and may include unintentional dictation errors.  Nanda Quinton, MD Emergency Medicine    Long, [facility], MD 11/04/17 [phone]

## 2017-11-04 NOTE — Consult Note (Signed)
NEURO HOSPITALIST      Requesting Physician: Dr. Laverta Baltimore    Chief Complaint:   Right facial droop, left arm weakness History obtained from:  Patient     HPI:                                                                                                                                         [name] is an 77 y.o. female with PMH significant for afib ( not on anti- coagulation, pt refuses), CVA ( 1995; hemorrhagic), HTN who presented to [facility] as a code stroke for right facial droop and left arm flaccid per report.  Patient was out at the grocery store with family when about 12:10 they noticed a sudden onset of facial droop, dysarthria, and left arm was flaccid and she had some confusion that lasted about 3 minutes. EMS was called and code stroke was initiated in route to [facility]. Denies any CP, SOB, HA, dizziness, vision changes. Patient also stated that she would not take any blood thinners in the future.  ED course: BP: 180/110,  NA: 130, Potassium: 3.1, chloride: 89, BUN 4, BG:111, CT head:  No acute stroke, hemorrhage, or mass effect. Small chronic infarct in right frontal lobe and small subacute to chronic infarct in the right parietal lobe. Advanced chronic microvascular ischemic changes and moderate volume loss of the brain.  CTA: 1. Proximal left M2 superior division occlusion within the sylvian fissure. 2. 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 Nonemergent CTA of the chest is recommended. 3. No additional large vessel occlusion, aneurysm, or hemodynamically significant stenosis by NASCET criteria. 4. Thyroid goiter with nodules measuring up to 3.8 cm. Nonemergent thyroid ultrasound is recommended.  1995 patient had a hemorrhagic stroke.  Date last known well: Date: 11/04/2017 Time last known well: Time: 12:10 tPA Given: No: patient declined treatment Modified Rankin: Rankin  Score=0  NIHSS:2 1a Level of Conscious:0 1b LOC Questions: 0 1c LOC Commands: 0 2 Best Gaze: 0 3 Visual: 0 4 Facial Palsy: 1 5a Motor Arm - left: 0 5b Motor Arm - Right: 0 6a Motor Leg - Left: 0 6b Motor Leg - Right: 0 7 Limb Ataxia: 0 8 Sensory: 0 9 Best Language: 0 10 Dysarthria:1 11 Extinct. and Inattention:0 TOTAL: 2  Past Medical History:  Diagnosis Date  . Atrial fibrillation ([place])   . Colon polyps   . Complication of anesthesia    difficulty awakening  . Diverticulosis   . Hypertension   . IATROGENIC CEREBROVASCULAR INFARCT/HEMORRHAGE NE 03/22/1993   Qualifier: Diagnosis of  By: Leanne Chang MD, Darnell Level  1996 per pt's report-Dr Vertell Limber- hemorrhagic stroke. No intervention required    . Stroke [facility])     Past Surgical History:  Procedure Laterality Date  . APPENDECTOMY    . EYE SURGERY Left    vitrectomy  . TOTAL HIP ARTHROPLASTY Right 09/03/2014   Procedure: RIGHT TOTAL HIP ARTHROPLASTY ANTERIOR APPROACH;  Surgeon: Renette Butters, MD;  Location: [facility];  Service: Orthopedics;  Laterality: Right;  . TUBAL LIGATION      Family History  Problem Relation Age of Onset  . Stroke Mother   . Hypertension Mother   . Heart disease Father        CABG, MI  . Cancer Father        skin  . Diabetes Brother   . Heart disease Brother   . Hypertension Brother       Social History:  reports that she has never smoked. She has never used smokeless tobacco. She reports that she does not drink alcohol or use drugs.  Allergies:  Allergies  Allergen Reactions  . Ace Inhibitors Anaphylaxis    Angioedema, perioperative  . Celebrex [Celecoxib] Itching and Other (See Comments)    Twitching   . Antihistamines, Chlorpheniramine-Type Other (See Comments)    afib  . Aspirin Other (See Comments)    REACTION: hx of hemorrhagic stroke  . Cortisone Other (See Comments)    "cortisone shoot made me jerk"  . Lidocaine Other (See Comments)    Pt was fine for ~2 days after she received  medication and states she then got "sore all over and could not move"  . Phenytoin Swelling    mostly in arms  . Sulfonamide Derivatives Itching    Medications:                                                                                                                           No current facility-administered medications for this encounter.    Current Outpatient Medications  Medication Sig Dispense Refill  . carvedilol (COREG) 12.5 MG tablet Take 1 tablet (12.5 mg total) by mouth 2 (two) times daily. 180 tablet 3  . EPINEPHrine (EPIPEN 2-PAK) 0.3 mg/0.3 mL IJ SOAJ injection Inject 0.3 mLs (0.3 mg total) into the muscle once. As needed for anaphylaxis - lip / mouth swelling, difficulty breathing.  Call 911 if injected. 1 Device 3  . hydrochlorothiazide (HYDRODIURIL) 25 MG tablet Take 1 tablet (25 mg total) by mouth daily. 90 tablet 3  . vitamin B-12 (CYANOCOBALAMIN) 500 MCG tablet Take 500 mcg by mouth daily.       ROS:  History obtained from the patient  General ROS: negative for - chills, fatigue, fever, night sweats, weight gain or weight loss Ophthalmic ROS: negative for - blurry vision, double vision, eye pain or loss of vision Respiratory ROS: negative for - cough,  shortness of breath or wheezing Cardiovascular ROS: negative for - chest pain, dyspnea on exertion,  Musculoskeletal ROS: negative for - joint swelling or muscular weakness Neurological ROS: as noted in HPI   General Examination:                                                                                                      Weight 58.1 kg.  HEENT-  Normocephalic, no lesions, without obvious abnormality.  Normal external eye and conjunctiva.  Cardiovascular- S1-S2 audible, pulses palpable throughout   Lungs-no rhonchi or wheezing noted, no excessive working breathing.   Saturations within normal limits on RA Extremities- Warm, dry and intact Musculoskeletal-no joint tenderness, deformity or swelling Skin-warm and dry, no hyperpigmentation, vitiligo, or suspicious lesions  Neurological Examination Mental Status: Alert, oriented, thought content appropriate.  Speech fluent without evidence of aphasia. Dysarthria noted.  Able to follow commands without difficulty. Cranial Nerves: II:  Visual fields grossly normal,  III,IV, VI: ptosis not present, extra-ocular motions intact bilaterally, pupils equal, round, reactive to light and accommodation V,VII: smile symmetric, facial light touch sensation normal bilaterally VIII: hearing normal bilaterally IX,X: uvula rises symmetrically XI: bilateral shoulder shrug XII: midline tongue extension Motor: Right : Upper extremity   5/5    Left:     Upper extremity   5/5  Lower extremity   5/5     Lower extremity   5/5 Tone and bulk:normal tone throughout; no atrophy noted Sensory:  light touch intact throughout, bilaterally Deep Tendon Reflexes: 2+ and symmetric biceps and patella Plantars: Right: downgoing   Left: downgoing Some toes are contracted Cerebellar: normal finger-to-nose,normal heel-to-shin test Gait: deferred   Lab Results: Basic Metabolic Panel: Recent Labs  Lab 11/04/17 1324  NA 130*  K 3.1*  CL 89*  GLUCOSE 111*  BUN 4*  CREATININE 0.60    CBC: Recent Labs  Lab 11/04/17 1324  HGB 15.0  HCT 44.0    Lipid Panel: No results for input(s): CHOL, TRIG, HDL, CHOLHDL, VLDL, LDLCALC in the last 168 hours.  CBG: No results for input(s): GLUCAP in the last 168 hours.  Imaging: Ct Angio Head W Or Wo Contrast  Result Date: 11/04/2017 CLINICAL DATA:  77 y/o  F; code stroke. EXAM: CT ANGIOGRAPHY HEAD AND NECK TECHNIQUE: Multidetector CT imaging of the head and neck was performed using the standard protocol during bolus administration of intravenous contrast. Multiplanar CT image  reconstructions and MIPs were obtained to evaluate the vascular anatomy. Carotid stenosis measurements (when applicable) are obtained utilizing NASCET criteria, using the distal internal carotid diameter as the denominator. 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 The aneurysm of  the aorta is incompletely included within the field of view. Right carotid system: No evidence of dissection, stenosis (50% or greater) or occlusion. Calcified plaque of the right proximal ICA with mild less than 50% stenosis. Left carotid system: No evidence of dissection, stenosis (50% or greater) or occlusion. Vertebral arteries: Codominant. No evidence of dissection, stenosis (50% or greater) or occlusion. Skeleton: Moderate cervical spondylosis with multilevel disc and facet degenerative changes. No high-grade bony canal stenosis. No acute osseous abnormality is evident. Other neck: Multinodular thyroid goiter with the largest nodule measuring 3.8 x 3.7 cm in the axial plane (series 6, image 136). Upper chest: Pleuroparenchymal scarring in the lung apices bilaterally. Review of the MIP images confirms the above findings CTA HEAD FINDINGS Anterior circulation: Calcified plaque of carotid siphons with mild bilateral paraclinoid stenosis. Mild distal right M1 stenosis. Proximal occlusion of the left M2 superior division within the sylvian fissure (series 10, image 141 and series 9, image 115). No additional large vessel occlusion, stenosis, aneurysm, or vascular malformation. Posterior circulation: No significant stenosis, proximal occlusion, aneurysm, or vascular malformation. Venous sinuses: As permitted by contrast timing, patent. Anatomic variants: Bilateral fetal PCA. Large right A1, large anterior communicating artery, diminutive left A1 normal variant.  Review of the MIP images confirms the above findings IMPRESSION: 1. Proximal left M2 superior division occlusion within the sylvian fissure. 2. 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 Nonemergent CTA of the chest is recommended. 3. No additional large vessel occlusion, aneurysm, or hemodynamically significant stenosis by NASCET criteria. 4. Thyroid goiter with nodules measuring up to 3.8 cm. Nonemergent thyroid ultrasound is recommended. These results were called by telephone at the time of interpretation on 11/04/2017 at 1:48 pm to Dr. Rory Percy, who verbally acknowledged these results. Electronically Signed   By: Kristine Garbe M.D.   On: 11/04/2017 13:51   Ct Angio Neck W Or Wo Contrast  Result Date: 11/04/2017 CLINICAL DATA:  77 y/o  F; code stroke. EXAM: CT ANGIOGRAPHY HEAD AND NECK TECHNIQUE: Multidetector CT imaging of the head and neck was performed using the standard protocol during bolus administration of intravenous contrast. Multiplanar CT image reconstructions and MIPs were obtained to evaluate the vascular anatomy. Carotid stenosis measurements (when applicable) are obtained utilizing NASCET criteria, using the distal internal carotid diameter as the denominator. 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 The aneurysm of the aorta is incompletely included within the field of view. Right carotid system: No evidence of dissection, stenosis (50% or greater) or occlusion. Calcified plaque of the right proximal ICA with mild less than 50% stenosis. Left carotid system: No evidence of dissection, stenosis (50% or greater) or occlusion. Vertebral arteries: Codominant. No evidence of dissection, stenosis (50% or greater) or occlusion. Skeleton: Moderate cervical spondylosis with  multilevel disc and facet degenerative changes. No high-grade bony canal stenosis. No acute osseous abnormality is evident. Other neck: Multinodular thyroid goiter with the largest nodule measuring 3.8 x 3.7 cm in the axial plane (series 6, image 136). Upper chest: Pleuroparenchymal scarring in the lung apices bilaterally. Review of the MIP images confirms the above findings CTA HEAD FINDINGS Anterior circulation: Calcified plaque of carotid siphons with mild bilateral paraclinoid stenosis. Mild distal right M1 stenosis. Proximal occlusion of the left M2 superior division within the sylvian fissure (series 10,  image 141 and series 9, image 115). No additional large vessel occlusion, stenosis, aneurysm, or vascular malformation. Posterior circulation: No significant stenosis, proximal occlusion, aneurysm, or vascular malformation. Venous sinuses: As permitted by contrast timing, patent. Anatomic variants: Bilateral fetal PCA. Large right A1, large anterior communicating artery, diminutive left A1 normal variant. Review of the MIP images confirms the above findings IMPRESSION: 1. Proximal left M2 superior division occlusion within the sylvian fissure. 2. 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 Nonemergent CTA of the chest is recommended. 3. No additional large vessel occlusion, aneurysm, or hemodynamically significant stenosis by NASCET criteria. 4. Thyroid goiter with nodules measuring up to 3.8 cm. Nonemergent thyroid ultrasound is recommended. These results were called by telephone at the time of interpretation on 11/04/2017 at 1:48 pm to Dr. Rory Percy, who verbally acknowledged these results. Electronically Signed   By: Kristine Garbe M.D.   On: 11/04/2017 13:51   Ct Head Code Stroke Wo Contrast  Result Date: 11/04/2017 CLINICAL DATA:  Code stroke. 77 y/o F; left arm weakness and right facial droop. EXAM: CT HEAD WITHOUT CONTRAST TECHNIQUE: Contiguous axial images  were obtained from the base of the skull through the vertex without intravenous contrast. COMPARISON:  None. FINDINGS: Brain: Small right inferolateral frontal lobe chronic infarction and small right posterolateral parietal lobe subacute to chronic infarctions are present. There are advanced chronic microvascular ischemic changes of white matter and moderate volume loss of the brain. No acute stroke, hemorrhage, focal mass effect, extra-axial collection, or herniation. Vascular: Calcific atherosclerosis of the carotid siphons. No hyperdense vessel identified. Skull: Normal. Negative for fracture or focal lesion. Sinuses/Orbits: No acute finding. Other: None. ASPECTS [facility] Stroke Program Early CT Score) - Ganglionic level infarction (caudate, lentiform nuclei, internal capsule, insula, M1-M3 cortex): 7 - Supraganglionic infarction (M4-M6 cortex): 3 Total score (0-10 with 10 being normal): 10 IMPRESSION: 1. No acute stroke, hemorrhage, or mass effect. 2. Small chronic infarct in right frontal lobe and small subacute to chronic infarct in the right parietal lobe. 3. Advanced chronic microvascular ischemic changes and moderate volume loss of the brain. 4. ASPECTS is 10 These results were called by telephone at the time of interpretation on 11/04/2017 at 1:31 pm to Dr. Rory Percy, who verbally acknowledged these results. Electronically Signed   By: Kristine Garbe M.D.   On: 11/04/2017 13:38       Laurey Morale, MSN, NP-C [facility] [phone]  11/04/2017, 1:29 PM   Attending physician note to follow with Assessment and plan . Attending physician addendum note Patient seen and examined Imaging personally reviewed.  Agree with history and physical above. Noncontrast CT of the head is unremarkable for any acute change CT angiogram of the head and neck shows a proximal left M2 superior division occlusion within the sylvian fissure.  Assessment: 77 y.o. female ith PMH significant for  afib ( not on anti- coagulation, pt refuses), CVA (1995; hemorrhagic ?traumatic), HTN who presented to [facility] as a code stroke for right facial droop and left arm flaccid per report. CT head showed  No acute stroke, hemorrhage, or mass effect. Small chronic infarct in right frontal lobe and small subacute to chronic infarct in the right parietal lobe.  CTA: showed left M2 proximal occlusion. Her NIH stroke scale is 2 (one for facial droop, one for  I had a detailed discussion with the patient about IV TPA and endovascular thrombectomy. She is extremely reluctant to be on any kind of a blood thinner  or to go under anesthesia or under any procedure as she has had anaphylaxis with anesthetics in the past. I did explain to her that given this M2 clot, that her chances that she could get worse and get have a large disabling stroke. She understand the risks and wishes not to proceed with IV TPA. She also does not want to go in for any procedure unless her symptoms worsen, at that point she will consider the procedure but does not want to do IV TPA respective.  Stroke Risk Factors - atrial fibrillation and hypertension   Impression Acute ischemic stroke-left M2 occlusion   Recommend: --Admit to stepdown --Q1 hour neurochecks. CALL NEUROLOGY STAT WITH ANY CHANGE IN NEUROLOGICAL EXAM. -- BP goal : Permissive HTN upto 220/110 mmHg --MRI Brain  Without contrast --Echocardiogram --Aspirin 325 --High intensity Statin if LDL > 70 --HgbA1c, fasting lipid panel --PT consult, OT consult, Speech consult --Telemetry monitoring --Frequent neuro checks-  --Stroke swallow screen  --Will need anticoagulation in longer term but is vehemently refusing anticoagulation and understands risks of stroke progression and worsening.  IF THE PATIENT HAS WORSENING OF SYMPTOMS, SHE IS STILL IN THE WINDOW FOR IV-TPA AND THROMBECTOMY AT THIS TIME (1440 HRS). IV TPA WINDOW TILL 1640 HRS ON 11/04/17 AND IN ENDOVASCULAR TREATMENT  WINDOW TILL 1210 HRS ON 11/05/17.  PLEASE CALL NEUROLOGY STAT IF ANY NEUROCHANGE IS OBSERVED  Care plan was discussed with the patient's son, daughter, husband at bedside.  They also, along with the patient verbalized understanding of this discussion including risk of stroke progression and agreed for no intervention for now due to low stroke scale.  --please page stroke NP  Or  PA  Or MD from 8am -4 pm  as this patient from this time will be  followed by the stroke.   You can look them up on [email or web address]  Password [email or web address]  -- Amie [place], MD [facility] Pager: [phone] If 7pm to 7am, please call on call as listed on AMION.

## 2017-11-04 NOTE — Progress Notes (Signed)
[place], np paged to notify of hr in 140s with Afib

## 2017-11-04 NOTE — Progress Notes (Signed)
Pt suddenly started vomiting (x2) with decreased LOC [place], NP paged. Awaiting orders/call back. Charge nurse notified.

## 2017-11-05 ENCOUNTER — Observation Stay (HOSPITAL_COMMUNITY): Payer: Medicare Other | Admitting: Certified Registered"

## 2017-11-05 ENCOUNTER — Encounter (HOSPITAL_COMMUNITY)
Admission: EM | Disposition: A | Discharge status: Home / Self Care | Payer: Self-pay | Source: Home / Self Care | Attending: Neurology

## 2017-11-05 ENCOUNTER — Inpatient Hospital Stay (HOSPITAL_COMMUNITY): Payer: Medicare Other

## 2017-11-05 ENCOUNTER — Observation Stay (HOSPITAL_COMMUNITY): Payer: Medicare Other

## 2017-11-05 DIAGNOSIS — R471 Dysarthria and anarthria: Secondary | ICD-10-CM | POA: Diagnosis present

## 2017-11-05 DIAGNOSIS — E876 Hypokalemia: Secondary | ICD-10-CM | POA: Diagnosis present

## 2017-11-05 DIAGNOSIS — Q2545 Double aortic arch: Secondary | ICD-10-CM | POA: Diagnosis not present

## 2017-11-05 DIAGNOSIS — R29708 NIHSS score 8: Secondary | ICD-10-CM | POA: Diagnosis not present

## 2017-11-05 DIAGNOSIS — R29709 NIHSS score 9: Secondary | ICD-10-CM | POA: Diagnosis not present

## 2017-11-05 DIAGNOSIS — I10 Essential (primary) hypertension: Secondary | ICD-10-CM | POA: Diagnosis present

## 2017-11-05 DIAGNOSIS — Z96641 Presence of right artificial hip joint: Secondary | ICD-10-CM | POA: Diagnosis present

## 2017-11-05 DIAGNOSIS — K449 Diaphragmatic hernia without obstruction or gangrene: Secondary | ICD-10-CM | POA: Diagnosis present

## 2017-11-05 DIAGNOSIS — R29702 NIHSS score 2: Secondary | ICD-10-CM | POA: Diagnosis not present

## 2017-11-05 DIAGNOSIS — I482 Chronic atrial fibrillation: Secondary | ICD-10-CM | POA: Diagnosis present

## 2017-11-05 DIAGNOSIS — R29701 NIHSS score 1: Secondary | ICD-10-CM | POA: Diagnosis not present

## 2017-11-05 DIAGNOSIS — I700 Atherosclerosis of aorta: Secondary | ICD-10-CM | POA: Diagnosis present

## 2017-11-05 DIAGNOSIS — R4701 Aphasia: Secondary | ICD-10-CM | POA: Diagnosis present

## 2017-11-05 DIAGNOSIS — I639 Cerebral infarction, unspecified: Secondary | ICD-10-CM | POA: Diagnosis not present

## 2017-11-05 DIAGNOSIS — I361 Nonrheumatic tricuspid (valve) insufficiency: Secondary | ICD-10-CM | POA: Diagnosis not present

## 2017-11-05 DIAGNOSIS — N390 Urinary tract infection, site not specified: Secondary | ICD-10-CM | POA: Diagnosis not present

## 2017-11-05 DIAGNOSIS — I6389 Other cerebral infarction: Secondary | ICD-10-CM | POA: Diagnosis not present

## 2017-11-05 DIAGNOSIS — E785 Hyperlipidemia, unspecified: Secondary | ICD-10-CM | POA: Diagnosis present

## 2017-11-05 DIAGNOSIS — I629 Nontraumatic intracranial hemorrhage, unspecified: Secondary | ICD-10-CM | POA: Diagnosis not present

## 2017-11-05 DIAGNOSIS — I712 Thoracic aortic aneurysm, without rupture: Secondary | ICD-10-CM | POA: Diagnosis not present

## 2017-11-05 DIAGNOSIS — R29717 NIHSS score 17: Secondary | ICD-10-CM | POA: Diagnosis not present

## 2017-11-05 DIAGNOSIS — E871 Hypo-osmolality and hyponatremia: Secondary | ICD-10-CM | POA: Diagnosis not present

## 2017-11-05 DIAGNOSIS — J189 Pneumonia, unspecified organism: Secondary | ICD-10-CM | POA: Diagnosis not present

## 2017-11-05 DIAGNOSIS — G8191 Hemiplegia, unspecified affecting right dominant side: Secondary | ICD-10-CM | POA: Diagnosis not present

## 2017-11-05 DIAGNOSIS — I6602 Occlusion and stenosis of left middle cerebral artery: Secondary | ICD-10-CM | POA: Diagnosis not present

## 2017-11-05 DIAGNOSIS — I6340 Cerebral infarction due to embolism of unspecified cerebral artery: Secondary | ICD-10-CM | POA: Diagnosis not present

## 2017-11-05 DIAGNOSIS — E042 Nontoxic multinodular goiter: Secondary | ICD-10-CM | POA: Diagnosis not present

## 2017-11-05 DIAGNOSIS — I6300 Cerebral infarction due to thrombosis of unspecified precerebral artery: Secondary | ICD-10-CM | POA: Diagnosis not present

## 2017-11-05 DIAGNOSIS — R29810 Facial weakness: Secondary | ICD-10-CM | POA: Diagnosis present

## 2017-11-05 HISTORY — PX: RADIOLOGY WITH ANESTHESIA: SHX6223

## 2017-11-05 HISTORY — PX: IR ANGIOGRAM EXTREMITY LEFT: IMG651

## 2017-11-05 HISTORY — PX: IR ARCH CERVICICEBRAL NON-SEL (MS): IMG5354

## 2017-11-05 LAB — BASIC METABOLIC PANEL
Anion gap: 9 (ref 5–15)
BUN: 5 mg/dL — ABNORMAL LOW (ref 8–23)
CO2: 25 mmol/L (ref 22–32)
CREATININE: 0.57 mg/dL (ref 0.44–1.00)
Calcium: 8.8 mg/dL — ABNORMAL LOW (ref 8.9–10.3)
Chloride: 95 mmol/L — ABNORMAL LOW (ref 98–111)
GFR calc non Af Amer: >60 mL/min (ref >60)
GLUCOSE: 148 mg/dL — AB (ref 70–99)
Potassium: 2.9 mmol/L — ABNORMAL LOW (ref 3.5–5.1)
Sodium: 129 mmol/L — ABNORMAL LOW (ref 135–145)

## 2017-11-05 LAB — HEMOGLOBIN A1C
Hgb A1c MFr Bld: 5.2 % (ref 4.8–5.6)
Mean Plasma Glucose: 102.54 mg/dL

## 2017-11-05 LAB — LIPID PANEL
Cholesterol: 180 mg/dL (ref 0–200)
HDL: 62 mg/dL (ref >40)
LDL CALC: 110 mg/dL — AB (ref 0–99)
TRIGLYCERIDES: 38 mg/dL (ref <150)
Total CHOL/HDL Ratio: 2.9 RATIO
VLDL: 8 mg/dL (ref 0–40)

## 2017-11-05 LAB — MRSA PCR SCREENING: MRSA by PCR: NEGATIVE

## 2017-11-05 SURGERY — RADIOLOGY WITH ANESTHESIA
Anesthesia: General

## 2017-11-05 MED ORDER — LACTATED RINGERS IV SOLN: INTRAVENOUS | Status: DC | PRN

## 2017-11-05 MED ORDER — CLOPIDOGREL BISULFATE 75 MG PO TABS
75.0000 mg | ORAL_TABLET | Freq: Every day | ORAL | Status: DC
  Filled 2017-11-05: qty 1

## 2017-11-05 MED ORDER — FENTANYL CITRATE (PF) 100 MCG/2ML IJ SOLN
INTRAMUSCULAR | Status: DC | PRN
  Administered 2017-11-05 (×2): 50 ug via INTRAVENOUS

## 2017-11-05 MED ORDER — IOPAMIDOL (ISOVUE-370) INJECTION 76%
INTRAVENOUS | Status: AC
  Filled 2017-11-05: qty 50

## 2017-11-05 MED ORDER — STROKE: EARLY STAGES OF RECOVERY BOOK
Freq: Once | Status: DC
  Filled 2017-11-05: qty 1

## 2017-11-05 MED ORDER — SODIUM CHLORIDE 0.9 % IV SOLN
INTRAVENOUS | Status: DC | PRN
  Administered 2017-11-05: 75 ug/min via INTRAVENOUS

## 2017-11-05 MED ORDER — FENTANYL CITRATE (PF) 100 MCG/2ML IJ SOLN: 25.0000 ug | INTRAMUSCULAR | Status: DC | PRN

## 2017-11-05 MED ORDER — ASPIRIN 325 MG PO TABS
ORAL_TABLET | ORAL | Status: AC
  Filled 2017-11-05: qty 1

## 2017-11-05 MED ORDER — FENTANYL CITRATE (PF) 100 MCG/2ML IJ SOLN
INTRAMUSCULAR | Status: AC
  Filled 2017-11-05: qty 4

## 2017-11-05 MED ORDER — PROPOFOL 10 MG/ML IV BOLUS
INTRAVENOUS | Status: DC | PRN
  Administered 2017-11-05: 80 mg via INTRAVENOUS

## 2017-11-05 MED ORDER — ROCURONIUM 10MG/ML (10ML) SYRINGE FOR MEDFUSION PUMP - OPTIME
INTRAVENOUS | Status: DC | PRN
  Administered 2017-11-05: 50 mg via INTRAVENOUS

## 2017-11-05 MED ORDER — IOPAMIDOL (ISOVUE-300) INJECTION 61%
INTRAVENOUS | Status: AC
  Administered 2017-11-05: 75 mL
  Filled 2017-11-05: qty 100

## 2017-11-05 MED ORDER — ONDANSETRON HCL 4 MG/2ML IJ SOLN
INTRAMUSCULAR | Status: DC | PRN
  Administered 2017-11-05: 4 mg via INTRAVENOUS

## 2017-11-05 MED ORDER — LIDOCAINE HCL 1 % IJ SOLN
INTRAMUSCULAR | Status: AC
  Filled 2017-11-05: qty 20

## 2017-11-05 MED ORDER — IOPAMIDOL (ISOVUE-370) INJECTION 76%
40.0000 mL | Freq: Once | INTRAVENOUS | Status: AC | PRN
  Administered 2017-11-05: 40 mL via INTRAVENOUS

## 2017-11-05 MED ORDER — IOHEXOL 300 MG/ML  SOLN
300.0000 mL | Freq: Once | INTRAMUSCULAR | Status: AC | PRN
  Administered 2017-11-05: 100 mL via INTRAVENOUS

## 2017-11-05 MED ORDER — CEFAZOLIN SODIUM-DEXTROSE 2-4 GM/100ML-% IV SOLN
INTRAVENOUS | Status: AC
  Filled 2017-11-05: qty 100

## 2017-11-05 MED ORDER — TIROFIBAN HCL IN NACL 5-0.9 MG/100ML-% IV SOLN
INTRAVENOUS | Status: AC
  Filled 2017-11-05: qty 100

## 2017-11-05 MED ORDER — TICAGRELOR 90 MG PO TABS
ORAL_TABLET | ORAL | Status: AC
  Filled 2017-11-05: qty 2

## 2017-11-05 MED ORDER — POTASSIUM CHLORIDE CRYS ER 20 MEQ PO TBCR
20.0000 meq | EXTENDED_RELEASE_TABLET | Freq: Three times a day (TID) | ORAL | Status: AC
  Administered 2017-11-05 – 2017-11-06 (×6): 20 meq via ORAL
  Filled 2017-11-05 (×6): qty 1

## 2017-11-05 MED ORDER — SUGAMMADEX SODIUM 200 MG/2ML IV SOLN
INTRAVENOUS | Status: DC | PRN
  Administered 2017-11-05: 200 mg via INTRAVENOUS

## 2017-11-05 MED ORDER — NITROGLYCERIN 1 MG/10 ML FOR IR/CATH LAB
INTRA_ARTERIAL | Status: AC
  Filled 2017-11-05: qty 10

## 2017-11-05 MED ORDER — DEXAMETHASONE SODIUM PHOSPHATE 10 MG/ML IJ SOLN
INTRAMUSCULAR | Status: DC | PRN
  Administered 2017-11-05: 10 mg via INTRAVENOUS

## 2017-11-05 MED ORDER — EPTIFIBATIDE 20 MG/10ML IV SOLN
INTRAVENOUS | Status: AC
  Filled 2017-11-05: qty 10

## 2017-11-05 MED ORDER — SUCCINYLCHOLINE CHLORIDE 20 MG/ML IJ SOLN
INTRAMUSCULAR | Status: DC | PRN
  Administered 2017-11-05: 100 mg via INTRAVENOUS

## 2017-11-05 MED ORDER — ATORVASTATIN CALCIUM 40 MG PO TABS
40.0000 mg | ORAL_TABLET | Freq: Every day | ORAL | Status: DC
  Administered 2017-11-05 – 2017-11-06 (×2): 40 mg via ORAL
  Filled 2017-11-05 (×2): qty 1

## 2017-11-05 MED ORDER — SODIUM CHLORIDE 0.9 % IV SOLN
INTRAVENOUS | Status: DC
  Administered 2017-11-05: 06:00:00 via INTRAVENOUS

## 2017-11-05 MED ORDER — CEFAZOLIN SODIUM-DEXTROSE 2-3 GM-%(50ML) IV SOLR
INTRAVENOUS | Status: DC | PRN
  Administered 2017-11-05: 2 g via INTRAVENOUS

## 2017-11-05 MED ORDER — CLOPIDOGREL BISULFATE 300 MG PO TABS
ORAL_TABLET | ORAL | Status: AC
  Filled 2017-11-05: qty 1

## 2017-11-05 MED ORDER — LACTATED RINGERS IV SOLN
INTRAVENOUS | Status: DC | PRN
  Administered 2017-11-05: 02:00:00 via INTRAVENOUS

## 2017-11-05 NOTE — Progress Notes (Addendum)
STROKE TEAM PROGRESS NOTE   HISTORY OF PRESENT ILLNESS (per record) Kayla Wilson is an 77 y.o. female with PMH significant for afib ( not on anti- coagulation, pt refuses), CVA ( 1995; hemorrhagic), HTN who presented to [facility] as a code stroke for right facial droop and left arm flaccid per report.  Kayla Wilson was out at the grocery store with family when about 12:10 they noticed a sudden onset of facial droop, dysarthria, and left arm was flaccid and she had some confusion that lasted about 3 minutes. EMS was called and code stroke was initiated in route to [facility]. Denies any CP, SOB, HA, dizziness, vision changes. Kayla Wilson also stated that she would not take any blood thinners in the future.  ED course: BP: 180/110,  NA: 130, Potassium: 3.1, chloride: 89, BUN 4, BG:111, CT head:  No acute stroke, hemorrhage, or mass effect. Small chronic infarct in right frontal lobe and small subacute to chronic infarct in the right parietal lobe. Advanced chronic microvascular ischemic changes and moderate volume loss of the brain.  CTA: 1. Proximal left M2 superior division occlusion within the sylvian fissure. 2. 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 Nonemergent CTA of the chest is recommended. 3. No additional large vessel occlusion, aneurysm, or hemodynamically significant stenosis by NASCET criteria. 4. Thyroid goiter with nodules measuring up to 3.8 cm. Nonemergent thyroid ultrasound is recommended.  1995 Kayla Wilson had a hemorrhagic stroke.  Date last known well: Date: 11/04/2017 Time last known well: Time: 12:10 tPA Given: No: Kayla Wilson declined treatment Modified Rankin: Rankin Score=0  NIHSS:2   SUBJECTIVE (INTERVAL HISTORY) Kayla Wilson doing well, no complaints     OBJECTIVE Vitals:   11/05/17 0630 11/05/17 0700 11/05/17 0730 11/05/17 0800  BP: 113/74 111/76 113/85 113/72  Pulse: (!) 36 78 95 89  Resp: 16 15 16 17   Temp:      TempSrc:       SpO2: 100% 100% 99% 99%  Weight:      Height:        CBC:  Recent Labs  Lab 11/04/17 1320 11/04/17 1324  WBC 6.4  --   NEUTROABS 3.0  --   HGB 13.6 15.0  HCT 41.3 44.0  MCV 87.7  --   PLT 211  --     Basic Metabolic Panel:  Recent Labs  Lab 11/04/17 1320 11/04/17 1324 11/05/17 0618  NA 129* 130* 129*  K 3.1* 3.1* 2.9*  CL 91* 89* 95*  CO2 27  --  25  GLUCOSE 113* 111* 148*  BUN 5* 4* 5*  CREATININE 0.66 0.60 0.57  CALCIUM 9.2  --  8.8*    Lipid Panel:     Component Value Date/Time   CHOL 180 11/05/2017 0618   TRIG 38 11/05/2017 0618   HDL 62 11/05/2017 0618   CHOLHDL 2.9 11/05/2017 0618   VLDL 8 11/05/2017 0618   LDLCALC 110 (H) 11/05/2017 0618   HgbA1c:  Lab Results  Component Value Date   HGBA1C 5.2 11/05/2017   Urine Drug Screen:     Component Value Date/Time   LABOPIA NONE DETECTED 11/04/2017 1318   COCAINSCRNUR NONE DETECTED 11/04/2017 1318   LABBENZ NONE DETECTED 11/04/2017 1318   AMPHETMU NONE DETECTED 11/04/2017 1318   THCU NONE DETECTED 11/04/2017 1318   LABBARB NONE DETECTED 11/04/2017 1318    Alcohol Level     Component Value Date/Time   ETH <10 11/04/2017 1320    IMAGING   Ct  Angio Head W Or Wo Contrast Ct Angio Neck W Or Wo Contrast 11/04/2017 IMPRESSION:  1. Proximal left M2 superior division occlusion within the sylvian fissure.  2. 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 Nonemergent CTA of the chest is recommended.  3. No additional large vessel occlusion, aneurysm, or hemodynamically significant stenosis by NASCET criteria. 4. Thyroid goiter with nodules measuring up to 3.8 cm. Nonemergent thyroid ultrasound is recommended.     Ct Head Wo Contrast 11/05/2017 IMPRESSION:  Stable head CT. No complication identified status post recent arteriogram. No appreciable new or evolving infarct identified.   Ct Head Wo Contrast 11/05/2017   ADDENDUM REPORT: 11/05/2017 02:12 ASPECTS  determined to be 10.   11/05/2017 IMPRESSION:  1. Stable appearance of the head with no new acute intracranial abnormality identified.  2. 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  3. Advanced chronic microvascular ischemic disease with moderate cerebral atrophy.    Mr Brain Wo Contrast 11/04/2017 IMPRESSION:  1. Areas of signal abnormality on diffusion-weighted imaging in the left frontal white matter, without reduced apparent diffusion coefficient on the ADC map, likely indicating subacute ischemia.  2. Old right frontal and right parietal infarcts.  3. Atrophy and severe chronic microvascular ischemia.   MRI Brain Wo Contrast - repeat 11/05/2017 IMPRESSION: 1. Small areas of acute infarct in the left superior cerebellum and left superior insular cortex were not seen on the MRI yesterday. 2. No change in small areas of acute infarct in the left frontal white matter and left parietal cortex. 3. Advanced chronic microvascular ischemia with multiple areas of intracranial chronic hemorrhage.   Ct Cerebral Perfusion W Contrast 11/05/2017 IMPRESSION:  Negative CT perfusion with no evidence for core infarct. Elevated MTT within the left MCA distribution, likely related to known left M2 occlusion.  Ct Head Code Stroke Wo Contrast 11/04/2017 IMPRESSION:  1. No acute stroke, hemorrhage, or mass effect.  2. Small chronic infarct in right frontal lobe and small subacute to chronic infarct in the right parietal lobe.  3. Advanced chronic microvascular ischemic changes and moderate volume loss of the brain.  4. ASPECTS is 10    Cerebral Angiogram - IR - Dr Estanislado Pandy - no intervention 11/05/2017 S/P arch aortogram and Lt subclavian arteriogram RT CFA approach. Findings. 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 Origins of  the common carotid arteries not identified by arch aortogram. Vertebral  arteries noted bilaterally with antegrade flow.   Transthoracic Echocardiogram - pending 00/00/00       PHYSICAL EXAM Blood pressure 113/72, pulse 89, temperature 98.1 F (36.7 C), temperature source Oral, resp. rate 17, height 5\' 8"  (1.727 m), weight 57.6 kg, SpO2 99 %.         ASSESSMENT/PLAN Ms. Kayla Wilson is a 77 y.o. female with history of afib ( not on anti- coagulation, pt refuses), CVA ( 1995; hemorrhagic), HTN,  presenting with right facial droop and left arm flaccid. She did not receive IV t-PA - pt. declined.  Stroke:  Multiple infarcts - embolic secondary to afib.  Resultant -  Improved deficits  CT head - stable  MRI head -  subacute ischemia left frontal white matter.  MRI Brain - repeat - Small areas of acute infarct in the left superior cerebellum and left superior insular cortex were not seen on the MRI yesterday.  MRA head - not performed  CTA H&N - Proximal left M2  superior division occlusion within the sylvian fissure.   Carotid Doppler - CTA neck performed - carotid dopplers not indicated  2D Echo - pending  LDL - 110  HgbA1c - 5.2  VTE prophylaxis - SCDs  Diet - Heart healthy with thin liquids.  No antithrombotic prior to admission, now on No antithrombotic currently  Kayla Wilson counseled to be compliant with her antithrombotic medications  Ongoing aggressive stroke risk factor management  Therapy recommendations:  pending  Disposition:  Pending  Hypertension  BP mildly low . Permissive hypertension (OK if < 220/120) but gradually normalize in 5-7 days . 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 Nonemergent CTA of the chest is recommended.   Thyroid goiter with nodules measuring up to 3.8 cm. Nonemergent thyroid ultrasound is recommended.   PLAN  Anticoagulate for Afib if she remains stable.  Repeat MRI - multiple infarcts.  Supplement potassium and recheck.  Case manager consult to determine which anticoagulant should be started based in insurance.  Pharmacy consult once anticoagulant has been determined.  Would start low dose aspirin until anticoagulation can be initiated but pt had "itching" with celebrex and pharmacy states cross sensitivity risk. Discussed with Dr Cristobal Goldmann - will start Plavix since we don't know when anticoagulation will be started - possibly not today.  Cardiology has been asked to evaluate pt for a Watchman device since long term anticoagulation might be problematic in this pt with history of hemorrhagic CVA and multiple areas of intracranial chronic hemorrhage on today's MRI. Spoke with Dr Bronson Ing. They are no longer doing that procedure at [facility] due to risk benefit ratio but EP will stop by tomorrow.      Had a NIHSS change last night from 2 to 17. Was taken to the angio lab but could not pass catheter due to anatomy in the aortic arch as per IR. Upon return Kayla Wilson improved back to an NIHSS of 1 this morning.  Exam: PERRL AAOx4 R facial droop 5/5 strength throughout Sensation intact No ataxia No aphasia or dysarthria noted  - Will repeat MRI today, if shows small stroke Kayla Wilson is now agreable to start anticoagulation. Will call case manager and pharamacy to discuss best choice for her based on insurance, renal function and comfort.   - MRI brain without contrast - Echo pending - Hyponatremia appears to be chronic in nature, continue NS @ 50cc/hr - PT/OT - Will observe in ICU for one more night    Hospital day #  0    To contact Stroke Continuity provider, please refer to [email or web address] After hours, contact General Neurology

## 2017-11-05 NOTE — Transfer of Care (Signed)
Immediate Anesthesia Transfer of Care Note  Patient: Kayla Wilson  Procedure(s) Performed: RADIOLOGY WITH ANESTHESIA (N/A )  Patient Location: PACU  Anesthesia Type:General  Level of Consciousness: oriented, sedated, drowsy, patient cooperative and responds to stimulation  Airway & Oxygen Therapy: Patient Spontanous Breathing and Patient connected to face mask oxygen  Post-op Assessment: Report given to RN, Post -op Vital signs reviewed and stable and Patient moving all extremities X 4  Post vital signs: Reviewed and stable  Last Vitals:  Vitals Value Taken Time  BP 136/94 11/05/2017  4:49 AM  Temp    Pulse 86 11/05/2017  4:49 AM  Resp 18 11/05/2017  4:49 AM  SpO2 100 % 11/05/2017  4:49 AM  Vitals shown include unvalidated device data.  Last Pain:  Vitals:   11/05/17 0140  TempSrc: Oral  PainSc:          Complications: No apparent anesthesia complications

## 2017-11-05 NOTE — Significant Event (Addendum)
Rapid Response Event Note  Overview:Called d/t neuro change, pt alert and oriented on initial assessment by RN.  Around 2200, pt began to have AMS and vomited X 2. NIH-9 (2 baseline from this admission). RRT unable to come at time of call d/t Code blue but asked bedside RN to page PCP and notify of condition. NP Blount ordered CT head.  RRT met pt and bedside RN in CT. Time Called: 2306 Arrival Time: 2345 Event Type: Neurologic  Initial Focused Assessment: Pt laying in bed, will awaken to sternal rub.  Will not follow commands, open eyes, or speak.  Pt will move BLE to painful stimuli but not BUE.  CBG PTA RRT-125. VS: HR-125(A-fib), BP-166/109, SpO2-95% on RA, RR-16. NIH-17.   Interventions: CT head-unchanged from previous scan. NP Blount called once CT results back and neurology paged. Neurology ordered CT perfusion scan.  Scan done and IR paged. Pt transferred to IR. NIH in IR-5.  Plan of Care (if not transferred): To IR.  Plan to transfer to 4N post IR. Event Summary: Name of Physician Notified: Kennon Holter, NP at ([phone], called PTA RRT)  Name of Consulting Physician Notified: Dr. Lorraine Lax at [phone]  Outcome: Transferred (Comment)(3w32>IR, will go to 4N post IR)  Event End Time: 0238  Dillard Essex

## 2017-11-05 NOTE — Progress Notes (Signed)
Patient ID: Kayla Wilson, female   DOB: 03-11-1941, 77 y.o.   MRN: [id number] INR. Patients neurological deterioration prompted diagnostic arteriogram with intent to treat the Lt MCA occlusion noted on an earlier CTA. Under GA an arch aortogram revealed aneurysmal dilatation of the aortic arch with distorted origins of the subclavian arteries. Both common carotid arteries not  identified angiographically.  Option of rad/brachial approach unlikely to be helpful in view of the dostoretd aortic arch anatomy as noted above. Option of direct carotid artery access also considered.However in view of the patients improved  NIHSS score just prior to the artriogram this was not pursued after discussion with Dr. Lorraine Lax. MD .  Plan . Extubate and repeat CT brain. Consider CTA of thethoracic arch    RT groin sheath removed with successful application of angioseal closure device. S.Shalene Gallen MD

## 2017-11-05 NOTE — Progress Notes (Addendum)
Back from CT with NIH increased further from 2 at shift assessment to 8 at 2300 to 9 at 0000 when return from stat head CT. RRT RN  Mindy at bedside met in [place] room. Awaiting stat head ct to be read then will page on call provider and neuro (Mindy, RRT RN).  Blount, NP came to bedside at 2315 and ordered stat head ct stating she will inform neuro with results

## 2017-11-05 NOTE — Evaluation (Signed)
Physical Therapy Evaluation Patient Details Name: Kayla Wilson MRN: [id number] DOB: Jan 07, 1941 Today's Date: 11/05/2017   History of Present Illness  77 y.o. female admitted on 11/04/17 for R facial droop and left arm weakness.  MRI revealed small areas of acute infarct (presumed cardioembolic in the L superior cerebellum and left superior insular cortex.  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  Pt with significant PMH of hemorrhagic stroke, HTN, A-fib, and R THA.  Clinical Impression  Pt with some mild gait instability that improved with increased gait distance.  She reports feeling at her baseline and husband reports she is presenting close to her baseline level of function as well.  Despite some staggering during gait, pt reports she has no recent h/o falling. As she continued to be more stable as we go, I anticipate pt will not need therapy follow up at discharge.   PT to follow acutely for deficits listed below.          Follow Up Recommendations No PT follow up;Supervision for mobility/OOB    Equipment Recommendations  None recommended by PT    Recommendations for Other Services   NA    Precautions / Restrictions Precautions Precautions: Fall Precaution Comments: pt uses cane at baseline, doesn't report h/o falls      Mobility  Bed Mobility               General bed mobility comments: Pt was OOB in the recliner chair.   Transfers Overall transfer level: Needs assistance Equipment used: Rolling walker (2 wheeled) Transfers: Sit to/from Stand Sit to Stand: Min assist         General transfer comment: Min assist from low toilet, less min assist from higher recliner chair.   Ambulation/Gait Ambulation/Gait assistance: Min assist Gait Distance (Feet): 130 Feet Assistive device: Straight cane Gait Pattern/deviations: Step-through  pattern;Staggering right;Staggering left     General Gait Details: Pt with mildly staggering gait pattern, prefered to walk with cane in L hand and IV pole in right hand, but I made her take it away so I could tell how she was really walking.  Mildly unsteady, but improved with increased distance.       Modified Rankin (Stroke Patients Only) Modified Rankin (Stroke Patients Only) Pre-Morbid Rankin Score: Slight disability Modified Rankin: Moderately severe disability     Balance Overall balance assessment: Needs assistance Sitting-balance support: Feet supported;No upper extremity supported Sitting balance-Leahy Scale: Good     Standing balance support: Single extremity supported;Bilateral upper extremity supported Standing balance-Leahy Scale: Poor Standing balance comment: needs external assist in standing for balance.                              Pertinent Vitals/Pain Pain Assessment: No/denies pain    Home Living Family/patient expects to be discharged to:: Private residence Living Arrangements: Spouse/significant other Available Help at Discharge: Family Type of Home: House Home Access: Level entry     Home Layout: Two level;Able to live on main level with bedroom/bathroom(master bath is downstairs) Home Equipment: Cane - single point;Shower seat      Prior Function Level of Independence: Independent with assistive device(s)         Comments: uses a cane for mobility     Hand Dominance   Dominant Hand: Left    Extremity/Trunk Assessment  Upper Extremity Assessment Upper Extremity Assessment: Defer to OT evaluation    Lower Extremity Assessment Lower Extremity Assessment: Generalized weakness    Cervical / Trunk Assessment Cervical / Trunk Assessment: Kyphotic  Communication   Communication: No difficulties  Cognition Arousal/Alertness: Awake/alert Behavior During Therapy: WFL for tasks assessed/performed                                    General Comments: Not specifically tested, conversation normal and history was not corrected by family.              Assessment/Plan    PT Assessment Patient needs continued PT services  PT Problem List Decreased balance;Decreased mobility       PT Treatment Interventions DME instruction;Gait training;Functional mobility training;Therapeutic activities;Therapeutic exercise;Balance training;Neuromuscular re-education;Patient/family education    PT Goals (Current goals can be found in the Care Plan section)  Acute Rehab PT Goals Patient Stated Goal: to go home soon PT Goal Formulation: With patient Time For Goal Achievement: 11/19/17 Potential to Achieve Goals: Good    Frequency Min 4X/week           AM-PAC PT "6 Clicks" Daily Activity  Outcome Measure Difficulty turning over in bed (including adjusting bedclothes, sheets and blankets)?: A Little Difficulty moving from lying on back to sitting on the side of the bed? : A Little Difficulty sitting down on and standing up from a chair with arms (e.g., wheelchair, bedside commode, etc,.)?: Unable Help needed moving to and from a bed to chair (including a wheelchair)?: A Little Help needed walking in hospital room?: A Little Help needed climbing 3-5 steps with a railing? : A Little 6 Click Score: 16    End of Session Equipment Utilized During Treatment: Gait belt Activity Tolerance: Patient tolerated treatment well Patient left: in chair;with call bell/phone within reach Nurse Communication: Mobility status PT Visit Diagnosis: Unsteadiness on feet (R26.81);Difficulty in walking, not elsewhere classified (R26.2)    Time: 2035-5974 PT Time Calculation (min) (ACUTE ONLY): 23 min   Charges:        Wells Guiles B. Olean Sangster, PT, DPT [phone]   PT Evaluation $PT Eval Moderate Complexity: 1 Mod PT Treatments $Gait Training: 8-22 mins        11/05/2017, 4:12 PM

## 2017-11-05 NOTE — Progress Notes (Signed)
Pt stated to RN that she would not take plavix because her mother took this and it made her incontinent of stool. She also stated that she would not be interested in xarelto or eliquis because she has had family members that did not do well on it. She did say she would think about coumadin. RN tried to educate pt that every person is different and she may not be effected that way and that afib is a risk factor for having another stroke. She has an allergy to clebrex/asa so this can not be ordered at this time. Lynwood Dawley PA with neuro notified and discussed with RN

## 2017-11-05 NOTE — Anesthesia Procedure Notes (Signed)
Arterial Line Insertion Start/End8/17/2019 2:24 AM, 11/05/2017 2:26 AM Performed by: Effie Berkshire, MD, anesthesiologist  Patient location: Pre-op. Preanesthetic checklist: patient identified, IV checked, site marked, risks and benefits discussed, surgical consent, monitors and equipment checked, pre-op evaluation, timeout performed and anesthesia consent Lidocaine 1% used for infiltration Left, radial was placed Catheter size: 20 Fr Hand hygiene performed  and maximum sterile barriers used   Attempts: 1 Procedure performed without using ultrasound guided technique. Following insertion, dressing applied. Post procedure assessment: normal and unchanged

## 2017-11-05 NOTE — Sedation Documentation (Signed)
Tillie Rung, RN 4N Charge called with update on pt condition and plan of care.

## 2017-11-05 NOTE — Sedation Documentation (Signed)
Pt taken to PACU for recovery. Report given at bedside to Chip, RN.

## 2017-11-05 NOTE — Evaluation (Addendum)
Speech Language Pathology Evaluation Patient Details Name: Kayla Wilson MRN: [id number] DOB: 07/27/1940 Today's Date: 11/05/2017 Time: 9449-6759 SLP Time Calculation (min) (ACUTE ONLY): 18 min  Problem List:  Patient Active Problem List   Diagnosis Date Noted  . 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 CVA (cerebral vascular accident) ([place]) 11/04/2017  . 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 DJD (degenerative joint disease) 09/03/2014  . Angioedema   . Urticaria   . Allergic reaction to serum, anaphylactic shock   . Chronic systolic dysfunction of left ventricle 11/14/2013  . Atrial fibrillation ([place]) 09/26/2013  . UNSPECIFIED VENOUS INSUFFICIENCY 11/21/2008  . PSORIASIS 02/16/2008  . 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 DIVERTICULOSIS, COLON 01/12/2007  . COLONIC POLYPS, HX OF 01/12/2007  . Essential hypertension 11/15/2006   Past Medical History:  Past Medical History:  Diagnosis Date  . Atrial fibrillation ([place])   . Colon polyps   . Complication of anesthesia    difficulty awakening  . CVA (cerebral vascular accident) ([place]) 11/04/2017  . Diverticulosis   . Hypertension   . IATROGENIC CEREBROVASCULAR INFARCT/HEMORRHAGE NE 03/22/1993   Qualifier: Diagnosis of  By: Leanne Chang MD, [place] per pt's report-Dr Vertell Limber- hemorrhagic stroke. No intervention required    . Stroke [facility])    Past Surgical History:  Past Surgical History:  Procedure Laterality Date  . APPENDECTOMY    . EYE SURGERY Left    vitrectomy  . TOTAL HIP ARTHROPLASTY Right 09/03/2014   Procedure: RIGHT TOTAL HIP ARTHROPLASTY ANTERIOR APPROACH;  Surgeon: Renette Butters, MD;  Location: [facility];  Service: Orthopedics;  Laterality: Right;  . TUBAL LIGATION     HPI:  Kayla Wilson is a 77 y.o. female with history of afib, CVA ( 1995; hemorrhagic), HTN,  presenting with right facial droop and left arm flaccid. Pt declined IV t-PA. MRI brain 11/05/17 showed acute left frontal and  parietal infarcts, small infarcts in left superior cerebellum and left superior insular cortex.   Assessment / Plan / Recommendation Clinical Impression   Patient presents with cognitive-linguistic skills appearing within functional limits for tasks assessed. Verbal expression and auditory comprehension are adequate for moderately complex conversation, with minimal hesitation x1 but no apparent word-finding difficulties. Speech is clear without dysarthria. Confrontation naming and divergent naming are [facility]; pt also easily named all 11 of her grandchildren and 8 great-grandchildren. Pt reports occasional word-finding issues in conversation since her admission but feels this is resolving. She scored 26/30 on [place]; (26 and above is considered within normal limits). She scored 3/5 on delayed recall but was able to recall 5/5 words with category cue. Problem solving, alternating attention, and executive function skills appear WFL. SLP educated pt and her grandchildren re: compensations for anomia, pt declines further follow-up as she feels she is close to her baseline. SLP encouraged pt to have family supervise initially during tasks such as cooking, medications, and finance tasks to ensure no functional deficits. No further skilled ST recommended, SLP will s/o.     SLP Assessment  SLP Recommendation/Assessment: Patient does not need any further Speech Lanaguage Pathology Services SLP Visit Diagnosis: Aphasia (R47.01);Cognitive communication deficit (R41.841)    Follow Up Recommendations  None    Frequency and Duration           SLP Evaluation Cognition  Overall Cognitive Status: Within Functional Limits for tasks assessed Arousal/Alertness: Awake/alert Orientation Level: Oriented X4 Attention: Alternating;Selective Selective Attention: Appears intact Alternating Attention: Appears intact Memory:  Impaired Memory Impairment: Other (comment)(delayed recall 3/5, 5/5 with category  cue) Awareness: Appears intact Problem Solving: Appears intact Executive Function: Reasoning;Sequencing;Organizing Reasoning: Appears intact Sequencing: Appears intact Organizing: Appears intact Safety/Judgment: Appears intact       Comprehension  Auditory Comprehension Overall Auditory Comprehension: Appears within functional limits for tasks assessed Yes/No Questions: Within Functional Limits Commands: Within Functional Limits Conversation: Complex Visual Recognition/Discrimination Discrimination: Within Function Limits Reading Comprehension Reading Status: Within funtional limits    Expression Expression Primary Mode of Expression: Verbal Verbal Expression Overall Verbal Expression: Appears within functional limits for tasks assessed Level of Generative/Spontaneous Verbalization: Conversation Repetition: No impairment Naming: No impairment Confrontation: Within functional limits Divergent: 75-100% accurate(10 fruits in 60 seconds) Pragmatics: No impairment Written Expression Dominant Hand: Left Written Expression: Not tested   Oral / Motor  Oral Motor/Sensory Function Overall Oral Motor/Sensory Function: Within functional limits Motor Speech Overall Motor Speech: Appears within functional limits for tasks assessed Respiration: Within functional limits Phonation: Normal Resonance: Within functional limits Articulation: Within functional limitis Intelligibility: Intelligible Motor Planning: Witnin functional limits   [place], [place], CCC-SLP Speech-Language Pathologist [phone]  Aliene Altes 11/05/2017, 5:11 PM

## 2017-11-05 NOTE — Anesthesia Postprocedure Evaluation (Signed)
Anesthesia Post Note  Patient: Keona W Sperbeck  Procedure(s) Performed: RADIOLOGY WITH ANESTHESIA (N/A )     Patient location during evaluation: PACU Anesthesia Type: General Level of consciousness: awake and alert Pain management: pain level controlled Vital Signs Assessment: post-procedure vital signs reviewed and stable Respiratory status: spontaneous breathing, nonlabored ventilation, respiratory function stable and patient connected to nasal cannula oxygen Cardiovascular status: blood pressure returned to baseline and stable Postop Assessment: no apparent nausea or vomiting Anesthetic complications: no    Last Vitals:  Vitals:   11/05/17 0545 11/05/17 0600  BP: (!) 140/99 118/81  Pulse: (!) 30 98  Resp: 18 17  Temp: 36.7 C   SpO2: 97% 100%    Last Pain:  Vitals:   11/05/17 0545  TempSrc: Oral  PainSc: 0-No pain                 Effie Berkshire

## 2017-11-05 NOTE — Procedures (Signed)
S/P arch aortogram and Lt subclavian arteriogram RT CFA approach. Findings. 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 Origins of  the common carotid arteries not identified by arch aortogram. Vertebral arteries noted bilaterally with antegrade flow.

## 2017-11-05 NOTE — Anesthesia Procedure Notes (Addendum)
Procedure Name: Intubation Date/Time: 11/05/2017 2:21 AM Performed by: Claris Che, CRNA Pre-anesthesia Checklist: Patient identified, Emergency Drugs available, Suction available, Patient being monitored and Timeout performed Patient Re-evaluated:Patient Re-evaluated prior to induction Oxygen Delivery Method: Circle System Utilized Preoxygenation: Pre-oxygenation with 100% oxygen Induction Type: IV induction Laryngoscope Size: Mac and 3 Grade View: Grade I Tube type: Subglottic suction tube Tube size: 7.5 mm Number of attempts: 1 Airway Equipment and Method: Oral airway and Stylet Placement Confirmation: ETT inserted through vocal cords under direct vision,  positive ETCO2 and breath sounds checked- equal and bilateral Secured at: 22 cm Tube secured with: Tape Dental Injury: Teeth and Oropharynx as per pre-operative assessment

## 2017-11-05 NOTE — Sedation Documentation (Signed)
Tillie Rung, RN, 4N Charge, called and updated on pts condition. Plan is for pt to go to 4N15. Pt placement aware.

## 2017-11-05 NOTE — Anesthesia Preprocedure Evaluation (Addendum)
Anesthesia Evaluation  Patient identified by MRN, date of birth, ID band Patient confused    Reviewed: Unable to perform ROS - Chart review onlyPreop documentation limited or incomplete due to emergent nature of procedure.  Airway Mallampati: I  TM Distance: >3 FB Neck ROM: Full    Dental  (+) Edentulous Upper, Edentulous Lower   Pulmonary    breath sounds clear to auscultation       Cardiovascular hypertension, Pt. on home beta blockers and Pt. on medications + Peripheral Vascular Disease  + dysrhythmias Atrial Fibrillation  Rhythm:Irregular Rate:Abnormal     Neuro/Psych CVA, Residual Symptoms    GI/Hepatic negative GI ROS, Neg liver ROS,   Endo/Other  negative endocrine ROS  Renal/GU negative Renal ROS     Musculoskeletal  (+) Arthritis ,   Abdominal Normal abdominal exam  (+)   Peds  Hematology negative hematology ROS (+)   Anesthesia Other Findings   Reproductive/Obstetrics                             Lab Results  Component Value Date   WBC 6.4 11/04/2017   HGB 15.0 11/04/2017   HCT 44.0 11/04/2017   MCV 87.7 11/04/2017   PLT 211 11/04/2017   Lab Results  Component Value Date   CREATININE 0.60 11/04/2017   BUN 4 (L) 11/04/2017   NA 130 (L) 11/04/2017   K 3.1 (L) 11/04/2017   CL 89 (L) 11/04/2017   CO2 27 11/04/2017   Lab Results  Component Value Date   INR 1.14 11/04/2017   INR 1.68 (H) 09/03/2014   INR 1.20 08/23/2014   EKG: atrial fibrillation.   Anesthesia Physical Anesthesia Plan  ASA: III and emergent  Anesthesia Plan: General   Post-op Pain Management:    Induction: Intravenous  PONV Risk Score and Plan: 4 or greater and Ondansetron and Treatment may vary due to age or medical condition  Airway Management Planned: Oral ETT  Additional Equipment: Arterial line  Intra-op Plan:   Post-operative Plan: Possible Post-op  intubation/ventilation  Informed Consent: I have reviewed the patients History and Physical, chart, labs and discussed the procedure including the risks, benefits and alternatives for the proposed anesthesia with the patient or authorized representative who has indicated his/her understanding and acceptance.   Dental advisory given  Plan Discussed with: CRNA  Anesthesia Plan Comments:         Anesthesia Quick Evaluation

## 2017-11-05 NOTE — Progress Notes (Addendum)
Called to the bedside by RN. Pt with AMS,right-sided weakness, N/V. On arrival to bedside pt mental status began to improve. A/Ox3, but still lethargic. Pt verbalizes that she is in the hospital but stated she was in [place]. Pupils equal and reactive, VSS, but severe right-sided weakness noted. RN states that this is an improvement from earlier assessment Stat CT of the head and BMP ordered.   Altered Mental Status - CT of the head ordered. Reading considered unremarkable c in comparison to earlier imaging.  - BMP ordered. It is noted that Na was low on previous lab work. Will recheck.  - Continue with q2hr neuro checks as ordered.  -Neurology made aware. Stat CT perfusion ordered.    Lovey Newcomer, NP [facility] 7p-7a [phone]

## 2017-11-05 NOTE — Sedation Documentation (Signed)
Pt extubated

## 2017-11-05 NOTE — Progress Notes (Addendum)
NIHSS worsened to 17, aphasic and r side weakness, neglect. CT head showed aspects 10/10. stat CT perfusion - patient moved significantly during scan and accuracy questionable.  Discussed with IR and family, will proceed to EMT.   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 Discussed with Devashwar.  Patient will be admitted to ICU, Stroke team as primary.

## 2017-11-06 ENCOUNTER — Inpatient Hospital Stay (HOSPITAL_COMMUNITY): Payer: Medicare Other

## 2017-11-06 DIAGNOSIS — I361 Nonrheumatic tricuspid (valve) insufficiency: Secondary | ICD-10-CM

## 2017-11-06 LAB — BASIC METABOLIC PANEL
Anion gap: 7 (ref 5–15)
BUN: 9 mg/dL (ref 8–23)
CO2: 25 mmol/L (ref 22–32)
CREATININE: 0.54 mg/dL (ref 0.44–1.00)
Calcium: 8.7 mg/dL — ABNORMAL LOW (ref 8.9–10.3)
Chloride: 95 mmol/L — ABNORMAL LOW (ref 98–111)
GFR calc Af Amer: >60 mL/min (ref >60)
GLUCOSE: 123 mg/dL — AB (ref 70–99)
POTASSIUM: 3.9 mmol/L (ref 3.5–5.1)
Sodium: 127 mmol/L — ABNORMAL LOW (ref 135–145)

## 2017-11-06 LAB — CBC WITH DIFFERENTIAL/PLATELET
ABS IMMATURE GRANULOCYTES: 0.2 10*3/uL — AB (ref 0.0–0.1)
BASOS ABS: 0 10*3/uL (ref 0.0–0.1)
Basophils Relative: 0 %
Eosinophils Absolute: 0 10*3/uL (ref 0.0–0.7)
Eosinophils Relative: 0 %
HCT: 39.7 % (ref 36.0–46.0)
Hemoglobin: 13.3 g/dL (ref 12.0–15.0)
IMMATURE GRANULOCYTES: 1 %
Lymphocytes Relative: 12 %
Lymphs Abs: 2.2 10*3/uL (ref 0.7–4.0)
MCH: 29 pg (ref 26.0–34.0)
MCHC: 33.5 g/dL (ref 30.0–36.0)
MCV: 86.5 fL (ref 78.0–100.0)
MONOS PCT: 8 %
Monocytes Absolute: 1.4 10*3/uL — ABNORMAL HIGH (ref 0.1–1.0)
NEUTROS ABS: 14 10*3/uL — AB (ref 1.7–7.7)
NEUTROS PCT: 79 %
Platelets: 209 10*3/uL (ref 150–400)
RBC: 4.59 MIL/uL (ref 3.87–5.11)
RDW: 13.1 % (ref 11.5–15.5)
WBC: 17.9 10*3/uL — ABNORMAL HIGH (ref 4.0–10.5)

## 2017-11-06 LAB — CBC
HCT: 36.6 % (ref 36.0–46.0)
Hemoglobin: 12.5 g/dL (ref 12.0–15.0)
MCH: 29.6 pg (ref 26.0–34.0)
MCHC: 34.2 g/dL (ref 30.0–36.0)
MCV: 86.5 fL (ref 78.0–100.0)
PLATELETS: 217 10*3/uL (ref 150–400)
RBC: 4.23 MIL/uL (ref 3.87–5.11)
RDW: 13.1 % (ref 11.5–15.5)
WBC: 20.4 10*3/uL — ABNORMAL HIGH (ref 4.0–10.5)

## 2017-11-06 LAB — ECHOCARDIOGRAM COMPLETE
Height: 68 in
Weight: 2031.76 [oz_av]

## 2017-11-06 LAB — URINALYSIS, ROUTINE W REFLEX MICROSCOPIC
Bilirubin Urine: NEGATIVE
Glucose, UA: NEGATIVE mg/dL
KETONES UR: NEGATIVE mg/dL
Nitrite: NEGATIVE
PH: 7 (ref 5.0–8.0)
PROTEIN: NEGATIVE mg/dL
Specific Gravity, Urine: 1.006 (ref 1.005–1.030)

## 2017-11-06 MED ORDER — IOPAMIDOL (ISOVUE-370) INJECTION 76%
INTRAVENOUS | Status: AC
  Filled 2017-11-06: qty 100

## 2017-11-06 MED ORDER — IOPAMIDOL (ISOVUE-370) INJECTION 76%
100.0000 mL | Freq: Once | INTRAVENOUS | Status: AC | PRN
  Administered 2017-11-06: 75 mL via INTRAVENOUS

## 2017-11-06 MED ORDER — CLOPIDOGREL BISULFATE 75 MG PO TABS: 75.0000 mg | ORAL_TABLET | Freq: Every day | ORAL | Status: DC

## 2017-11-06 MED ORDER — WARFARIN SODIUM 5 MG PO TABS
5.0000 mg | ORAL_TABLET | Freq: Once | ORAL | Status: AC
  Administered 2017-11-06: 5 mg via ORAL
  Filled 2017-11-06: qty 1

## 2017-11-06 MED ORDER — SODIUM CHLORIDE 0.9 % IV SOLN
1.0000 g | INTRAVENOUS | Status: DC
  Administered 2017-11-06: 1 g via INTRAVENOUS
  Filled 2017-11-06 (×2): qty 10

## 2017-11-06 MED ORDER — WARFARIN - PHARMACIST DOSING INPATIENT: Freq: Every day | Status: DC

## 2017-11-06 NOTE — Progress Notes (Signed)
OT Cancellation Note  Patient Details Name: Kayla Wilson MRN: [id number] DOB: April 09, 1940   Cancelled Treatment:    Reason Eval/Treat Not Completed: Patient at procedure or test/ unavailable. Pt completing echo at this time. Will check back as able to proceed with OT evaluation.   Norman Herrlich, MS OTR/L  Pager: [place] 11/06/2017, 9:59 AM

## 2017-11-06 NOTE — Progress Notes (Addendum)
STROKE TEAM PROGRESS NOTE   HISTORY OF PRESENT ILLNESS (per record) Kayla Wilson is an 77 y.o. female with PMH significant for afib ( not on anti- coagulation, pt refuses), CVA ( 1995; hemorrhagic), HTN who presented to [facility] as a code stroke for right facial droop and left arm flaccid per report.  Patient was out at the grocery store with family when about 12:10 they noticed a sudden onset of facial droop, dysarthria, and left arm was flaccid and she had some confusion that lasted about 3 minutes. EMS was called and code stroke was initiated in route to [facility]. Denies any CP, SOB, HA, dizziness, vision changes. Patient also stated that she would not take any blood thinners in the future.  ED course: BP: 180/110,  NA: 130, Potassium: 3.1, chloride: 89, BUN 4, BG:111, CT head:  No acute stroke, hemorrhage, or mass effect. Small chronic infarct in right frontal lobe and small subacute to chronic infarct in the right parietal lobe. Advanced chronic microvascular ischemic changes and moderate volume loss of the brain.  CTA: 1. Proximal left M2 superior division occlusion within the sylvian fissure. 2. 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 Nonemergent CTA of the chest is recommended. 3. No additional large vessel occlusion, aneurysm, or hemodynamically significant stenosis by NASCET criteria. 4. Thyroid goiter with nodules measuring up to 3.8 cm. Nonemergent thyroid ultrasound is recommended.  1995 patient had a hemorrhagic stroke.  Date last known well: Date: 11/04/2017 Time last known well: Time: 12:10 tPA Given: No: patient declined treatment Modified Rankin: Rankin Score=0  NIHSS:2   SUBJECTIVE (INTERVAL HISTORY) Patient doing well, no complaints. Open to the idea of starting coumadin    OBJECTIVE Vitals:   11/06/17 0600 11/06/17 0700 11/06/17 0746 11/06/17 0800  BP: 116/77 125/74  127/72  Pulse: (!) 43 71  76  Resp: 16 14  14    Temp:   97.8 F (36.6 C)   TempSrc:   Oral   SpO2: 99% 99%  100%  Weight:      Height:        CBC:  Recent Labs  Lab 11/04/17 1320 11/04/17 1324 11/06/17 0651  WBC 6.4  --  20.4*  NEUTROABS 3.0  --   --   HGB 13.6 15.0 12.5  HCT 41.3 44.0 36.6  MCV 87.7  --  86.5  PLT 211  --  371    Basic Metabolic Panel:  Recent Labs  Lab 11/05/17 0618 11/06/17 0651  NA 129* 127*  K 2.9* 3.9  CL 95* 95*  CO2 25 25  GLUCOSE 148* 123*  BUN 5* 9  CREATININE 0.57 0.54  CALCIUM 8.8* 8.7*    Lipid Panel:     Component Value Date/Time   CHOL 180 11/05/2017 0618   TRIG 38 11/05/2017 0618   HDL 62 11/05/2017 0618   CHOLHDL 2.9 11/05/2017 0618   VLDL 8 11/05/2017 0618   LDLCALC 110 (H) 11/05/2017 0618   HgbA1c:  Lab Results  Component Value Date   HGBA1C 5.2 11/05/2017   Urine Drug Screen:     Component Value Date/Time   LABOPIA NONE DETECTED 11/04/2017 1318   COCAINSCRNUR NONE DETECTED 11/04/2017 1318   LABBENZ NONE DETECTED 11/04/2017 1318   AMPHETMU NONE DETECTED 11/04/2017 1318   THCU NONE DETECTED 11/04/2017 1318   LABBARB NONE DETECTED 11/04/2017 1318    Alcohol Level     Component Value Date/Time   ETH <10 11/04/2017 1320  IMAGING   Ct Angio Head W Or Wo Contrast Ct Angio Neck W Or Wo Contrast 11/04/2017 IMPRESSION:  1. Proximal left M2 superior division occlusion within the sylvian fissure.  2. 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 Nonemergent CTA of the chest is recommended.  3. No additional large vessel occlusion, aneurysm, or hemodynamically significant stenosis by NASCET criteria.  4. Thyroid goiter with nodules measuring up to 3.8 cm. Nonemergent thyroid ultrasound is recommended.    Ct Head Wo Contrast 11/05/2017 IMPRESSION:  Stable head CT. No complication identified status post recent arteriogram. No appreciable new or evolving infarct identified.    Ct Head Wo Contrast 11/05/2017   ADDENDUM  REPORT: 11/05/2017 02:12 ASPECTS determined to be 10.  11/05/2017 IMPRESSION:  1. Stable appearance of the head with no new acute intracranial abnormality identified.  2. 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  3. Advanced chronic microvascular ischemic disease with moderate cerebral atrophy.    Mr Brain Wo Contrast 11/04/2017 IMPRESSION:  1. Areas of signal abnormality on diffusion-weighted imaging in the left frontal white matter, without reduced apparent diffusion coefficient on the ADC map, likely indicating subacute ischemia.  2. Old right frontal and right parietal infarcts.  3. Atrophy and severe chronic microvascular ischemia.   MRI Brain Wo Contrast - repeat 11/05/2017 IMPRESSION: 1. Small areas of acute infarct in the left superior cerebellum and left superior insular cortex were not seen on the MRI yesterday. 2. No change in small areas of acute infarct in the left frontal white matter and left parietal cortex. 3. Advanced chronic microvascular ischemia with multiple areas of intracranial chronic hemorrhage.   Ct Cerebral Perfusion W Contrast 11/05/2017 IMPRESSION:  Negative CT perfusion with no evidence for core infarct. Elevated MTT within the left MCA distribution, likely related to known left M2 occlusion.  Ct Head Code Stroke Wo Contrast 11/04/2017 IMPRESSION:  1. No acute stroke, hemorrhage, or mass effect.  2. Small chronic infarct in right frontal lobe and small subacute to chronic infarct in the right parietal lobe.  3. Advanced chronic microvascular ischemic changes and moderate volume loss of the brain.  4. ASPECTS is 10    Cerebral Angiogram - IR - Dr Estanislado Pandy - no intervention 11/05/2017 S/P arch aortogram and Lt subclavian arteriogram RT CFA approach. Findings. 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 Origins of  the common carotid arteries not identified by  arch aortogram. Vertebral arteries noted bilaterally with antegrade flow.   Transthoracic Echocardiogram - pending 00/00/00       PHYSICAL EXAM Vitals:   11/06/17 0746 11/06/17 0800 11/06/17 0900 11/06/17 1000  BP:  127/72 (!) 122/98 122/74  Pulse:  76 85 87  Resp:  14 19 13   Temp: 97.8 F (36.6 C)     TempSrc: Oral     SpO2:  100% 100% 100%  Weight:      Height:               ASSESSMENT/PLAN Kayla Wilson is a 77 y.o. female with history of afib ( not on anti- coagulation, pt refuses), CVA ( 1995; hemorrhagic), HTN,  presenting with right facial droop and left arm flaccid. She did not receive IV t-PA - pt. declined.  Stroke:  Multiple infarcts - embolic secondary to afib.  Resultant -  Improved deficits  CT head - stable  MRI head -  subacute ischemia left frontal white  matter.  MRI Brain - repeat - Small areas of acute infarct in the left superior cerebellum and left superior insular cortex were not seen on the MRI yesterday.  MRA head - not performed  CTA H&N - Proximal left M2 superior division occlusion within the sylvian fissure.   Carotid Doppler - CTA neck performed - carotid dopplers not indicated  2D Echo - pending  LDL - 110  HgbA1c - 5.2  VTE prophylaxis - SCDs  Diet - Heart healthy with thin liquids.  No antithrombotic prior to admission, now on No antithrombotic currently  Patient counseled to be compliant with her antithrombotic medications  Ongoing aggressive stroke risk factor management  Therapy recommendations:  No PT f/u recommended  Disposition:  Pending  Hypertension  BP mildly low . Permissive hypertension (OK if < 220/120) but gradually normalize in 5-7 days . 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 Nonemergent CTA of the chest is recommended.   Thyroid goiter with nodules measuring up to 3.8 cm. Nonemergent thyroid ultrasound is recommended.   WBCs 20.4 today. Afebrile. Check U/A and repeat CBC later today w diff.  PLAN  Repeat MRI - multiple small infarcts.  Supplement potassium and recheck.  Case manager consult to determine which anticoagulant should be started based in insurance.  Pharmacy consult to dose warfarin for afib.  Pt refuses newer anticoagulants.  Cardiology has been asked to evaluate pt for a Watchman device since long term anticoagulation might be problematic in this pt with history of hemorrhagic CVA and multiple areas of intracranial chronic hemorrhage on MRI. Spoke with Dr Bronson Ing. They are no longer doing that procedure at [facility] due to risk benefit ratio but EP will stop by for recommendations..   Will order thyroid US and CTA chest as recommended by radiology.  Leukocytosis- most likely reactive, will get UA and follow up CBC this afternoon  Transfer to floor today.   Exam:  NIHSS remains 1 for L facial droop PERRL [id number] Strength 5/5 throughout Intact sensation to light touch and pain throuhout No ataxia  Addendum:  WBC mildly improved but UA appears positive for UTI, will start Rocephin   Hospital day # 1    To contact Stroke Continuity provider, please refer to [email or web address] After hours, contact General Neurology

## 2017-11-06 NOTE — Progress Notes (Signed)
[place] for warfarin Indication: atrial fibrillation   Assessment: 60 yof with hx of afib, CVA not on anticoagulation due to pt refusal, admitted with CVA. Patient now agreeable to start warfarin, and Pharmacy consulted to dose (would not agree to Eliquis or Xarelto per RN note). Baseline INR 1.14 from 8/16. CBC wnl. No bleed issues documented.  Goal of Therapy:  INR 2-3 Monitor platelets by anticoagulation protocol: Yes   Plan:  Warfarin 5mg  PO x 1 dose Daily INR Monitor CBC, s/sx bleeding  Elicia Lamp, PharmD, BCPS Clinical Pharmacist 11/06/2017 8:44 AM

## 2017-11-06 NOTE — Progress Notes (Signed)
D/C Plavix when INR is 2.0 or greater.  Mikey Bussing PA-C [facility] Pager [phone] 11/06/2017, 2:21 PM

## 2017-11-06 NOTE — Evaluation (Signed)
Occupational Therapy Evaluation Patient Details Name: Kayla Wilson MRN: [id number] DOB: 20-Oct-1940 Today's Date: 11/06/2017    History of Present Illness 77 y.o. female admitted on 11/04/17 for R facial droop and left arm weakness.  MRI revealed small areas of acute infarct (presumed cardioembolic in the L superior cerebellum and left superior insular cortex.  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  Pt with significant PMH of hemorrhagic stroke, HTN, A-fib, and R THA.   Clinical Impression   PTA, pt reports modified independence with ADL and functional mobility. She does have L sided visual deficits at baseline. She uses a cane for functional mobility. Currently, pt requiring min guard assist for functional toilet transfers with cane and LB ADL today. She additionally presents with decreased safety awareness throughout tasks requiring cues for safety. Pt would benefit from continued OT services while admitted to maximize functional independence and safety with ADL and functional mobility prior to returning home with assistance from family. Do not anticipate need for OT follow-up post-acute D/C.      Follow Up Recommendations  No OT follow up;Supervision/Assistance - 24 hour    Equipment Recommendations  None recommended by OT    Recommendations for Other Services       Precautions / Restrictions Precautions Precautions: Fall Precaution Comments: pt uses cane at baseline, doesn't report h/o falls Restrictions Weight Bearing Restrictions: No      Mobility Bed Mobility Overal bed mobility: Needs Assistance Bed Mobility: Supine to Sit;Sit to Supine     Supine to sit: Supervision Sit to supine: Supervision   General bed mobility comments: Supervision for safety.   Transfers Overall transfer level: Needs assistance Equipment used: Rolling walker (2  wheeled) Transfers: Sit to/from Stand Sit to Stand: Min guard         General transfer comment: Guarding assist for safety.     Balance Overall balance assessment: Needs assistance Sitting-balance support: Feet supported;No upper extremity supported Sitting balance-Leahy Scale: Good     Standing balance support: Single extremity supported;Bilateral upper extremity supported Standing balance-Leahy Scale: Poor Standing balance comment: relies on external assistance in standing                           ADL either performed or assessed with clinical judgement   ADL Overall ADL's : Needs assistance/impaired Eating/Feeding: Set up;Sitting   Grooming: Supervision/safety;Standing   Upper Body Bathing: Set up;Sitting   Lower Body Bathing: Min guard;Sit to/from stand   Upper Body Dressing : Set up;Sitting   Lower Body Dressing: Min guard;Sit to/from stand   Toilet Transfer: Min guard;Ambulation;Grab bars(with cane)   Toileting- Clothing Manipulation and Hygiene: Min guard;Sit to/from stand       Functional mobility during ADLs: Min guard;Cane General ADL Comments: Pt with mild instability on her feet. She requires min guard assist for functional mobiltiy and demonstrates decreased functional use of cane. She is quick to make allowances for functional limitations.      Vision Baseline Vision/History: Wears glasses Wears Glasses: At all times Patient Visual Report: No change from baseline Vision Assessment?: Yes Eye Alignment: Within Functional Limits Ocular Range of Motion: Within Functional Limits Alignment/Gaze Preference: Within Defined Limits Tracking/Visual Pursuits: Able to track stimulus in all quads without difficulty Convergence: Within functional limits Visual Fields: Left visual field deficit(at baseline due to previous procedure. ) Additional Comments:  Pt able to complete functional visual assessment and reading tasks without difficulty. Reports       Perception     Praxis      Pertinent Vitals/Pain Pain Assessment: No/denies pain     Hand Dominance Left   Extremity/Trunk Assessment Upper Extremity Assessment Upper Extremity Assessment: Overall WFL for tasks assessed   Lower Extremity Assessment Lower Extremity Assessment: Generalized weakness   Cervical / Trunk Assessment Cervical / Trunk Assessment: Kyphotic   Communication Communication Communication: No difficulties   Cognition Arousal/Alertness: Awake/alert Behavior During Therapy: WFL for tasks assessed/performed Overall Cognitive Status: Impaired/Different from baseline Area of Impairment: Safety/judgement;Awareness                         Safety/Judgement: Decreased awareness of safety;Decreased awareness of deficits Awareness: Emergent   General Comments: Pt likely close to baseline. However, do note some decreased safety awareness and awareness in general.    General Comments  Pt with husband present on my arrival. Other family stepped in toward end of session. She is adamant concerning return home.     Exercises     Shoulder Instructions      Home Living Family/patient expects to be discharged to:: Private residence Living Arrangements: Spouse/significant other Available Help at Discharge: Family Type of Home: House Home Access: Level entry     Home Layout: Two level;Able to live on main level with bedroom/bathroom(master bath downstairs)     Bathroom Shower/Tub: Tub/shower unit   Bathroom Toilet: Handicapped height     Home Equipment: [place] - single point;Shower seat          Prior Functioning/Environment Level of Independence: Independent with assistive device(s)        Comments: uses a cane for mobility        OT Problem List: Decreased strength;Decreased activity tolerance;Decreased safety awareness;Decreased knowledge of use of DME or AE;Decreased knowledge of precautions;Pain      OT Treatment/Interventions:  Self-care/ADL training;Therapeutic exercise;Energy conservation;DME and/or AE instruction;Therapeutic activities;Patient/family education;Balance training    OT Goals(Current goals can be found in the care plan section) Acute Rehab OT Goals Patient Stated Goal: to get home "so I can clean the toilets" OT Goal Formulation: With patient Time For Goal Achievement: 11/20/17 Potential to Achieve Goals: Good ADL Goals Pt Will Perform Grooming: with modified independence;standing Pt Will Transfer to Toilet: with modified independence;ambulating;regular height toilet Pt Will Perform Toileting - Clothing Manipulation and hygiene: with modified independence;sit to/from stand Pt Will Perform Tub/Shower Transfer: with modified independence;ambulating;Tub transfer;rolling walker  OT Frequency: Min 1X/week   Barriers to D/C:            Co-evaluation              AM-PAC PT "6 Clicks" Daily Activity     Outcome Measure Help from another person eating meals?: None Help from another person taking care of personal grooming?: None Help from another person toileting, which includes using toliet, bedpan, or urinal?: A Little Help from another person bathing (including washing, rinsing, drying)?: A Little Help from another person to put on and taking off regular upper body clothing?: None Help from another person to put on and taking off regular lower body clothing?: A Little 6 Click Score: 21   End of Session Equipment Utilized During Treatment: (cane) Nurse Communication: Mobility status  Activity Tolerance: Patient tolerated treatment well Patient left: in bed;with bed alarm set;with family/visitor present  OT Visit Diagnosis: Other abnormalities of gait and  mobility (R26.89);Muscle weakness (generalized) (M62.81)                Time: 9675-9163 OT Time Calculation (min): 21 min Charges:  OT General Charges $OT Visit: 1 Visit OT Evaluation $OT Eval Moderate Complexity: 1 Mod  Norman Herrlich, MS OTR/L  Pager: [place] A Darold Miley 11/06/2017, 3:49 PM

## 2017-11-06 NOTE — Discharge Instructions (Addendum)

## 2017-11-06 NOTE — Progress Notes (Signed)
  Echocardiogram 2D Echocardiogram has been performed.  Kayla Wilson 11/06/2017, 10:21 AM

## 2017-11-07 ENCOUNTER — Encounter (HOSPITAL_COMMUNITY): Payer: Self-pay | Admitting: Interventional Radiology

## 2017-11-07 DIAGNOSIS — Z8673 Personal history of transient ischemic attack (TIA), and cerebral infarction without residual deficits: Secondary | ICD-10-CM

## 2017-11-07 DIAGNOSIS — I712 Thoracic aortic aneurysm, without rupture: Secondary | ICD-10-CM

## 2017-11-07 DIAGNOSIS — N390 Urinary tract infection, site not specified: Secondary | ICD-10-CM

## 2017-11-07 DIAGNOSIS — E785 Hyperlipidemia, unspecified: Secondary | ICD-10-CM

## 2017-11-07 DIAGNOSIS — E876 Hypokalemia: Secondary | ICD-10-CM

## 2017-11-07 DIAGNOSIS — E041 Nontoxic single thyroid nodule: Secondary | ICD-10-CM

## 2017-11-07 DIAGNOSIS — I7122 Aneurysm of the aortic arch, without rupture: Secondary | ICD-10-CM

## 2017-11-07 DIAGNOSIS — J984 Other disorders of lung: Secondary | ICD-10-CM

## 2017-11-07 DIAGNOSIS — I6300 Cerebral infarction due to thrombosis of unspecified precerebral artery: Secondary | ICD-10-CM

## 2017-11-07 LAB — CBC
HEMATOCRIT: 40.4 % (ref 36.0–46.0)
Hemoglobin: 13.4 g/dL (ref 12.0–15.0)
MCH: 28.9 pg (ref 26.0–34.0)
MCHC: 33.2 g/dL (ref 30.0–36.0)
MCV: 87.3 fL (ref 78.0–100.0)
PLATELETS: 204 10*3/uL (ref 150–400)
RBC: 4.63 MIL/uL (ref 3.87–5.11)
RDW: 13 % (ref 11.5–15.5)
WBC: 13.9 10*3/uL — ABNORMAL HIGH (ref 4.0–10.5)

## 2017-11-07 LAB — BASIC METABOLIC PANEL
ANION GAP: 8 (ref 5–15)
BUN: 8 mg/dL (ref 8–23)
CO2: 25 mmol/L (ref 22–32)
Calcium: 8.9 mg/dL (ref 8.9–10.3)
Chloride: 99 mmol/L (ref 98–111)
Creatinine, Ser: 0.62 mg/dL (ref 0.44–1.00)
GFR calc Af Amer: >60 mL/min (ref >60)
GLUCOSE: 109 mg/dL — AB (ref 70–99)
Potassium: 4.4 mmol/L (ref 3.5–5.1)
Sodium: 132 mmol/L — ABNORMAL LOW (ref 135–145)

## 2017-11-07 LAB — PROTIME-INR
INR: 1.12
Prothrombin Time: 14.4 seconds (ref 11.4–15.2)

## 2017-11-07 MED ORDER — ATORVASTATIN CALCIUM 40 MG PO TABS: 40.0000 mg | ORAL_TABLET | Freq: Every day | ORAL | 2 refills | Status: DC

## 2017-11-07 MED ORDER — APIXABAN 5 MG PO TABS: 5.0000 mg | ORAL_TABLET | Freq: Two times a day (BID) | ORAL | 2 refills | Status: DC

## 2017-11-07 MED ORDER — WARFARIN SODIUM 5 MG PO TABS: 5.0000 mg | ORAL_TABLET | Freq: Once | ORAL | Status: DC

## 2017-11-07 MED ORDER — APIXABAN 5 MG PO TABS
5.0000 mg | ORAL_TABLET | Freq: Two times a day (BID) | ORAL | Status: DC
  Administered 2017-11-07: 5 mg via ORAL
  Filled 2017-11-07: qty 1

## 2017-11-07 MED ORDER — HYDROCHLOROTHIAZIDE 25 MG PO TABS
25.0000 mg | ORAL_TABLET | Freq: Every day | ORAL | Status: DC
  Administered 2017-11-07: 25 mg via ORAL
  Filled 2017-11-07: qty 1

## 2017-11-07 MED ORDER — CARVEDILOL 12.5 MG PO TABS
12.5000 mg | ORAL_TABLET | Freq: Two times a day (BID) | ORAL | Status: DC
  Administered 2017-11-07: 12.5 mg via ORAL
  Filled 2017-11-07: qty 1

## 2017-11-07 MED ORDER — CIPROFLOXACIN HCL 500 MG PO TABS: 500.0000 mg | ORAL_TABLET | Freq: Two times a day (BID) | ORAL | 0 refills | Status: AC

## 2017-11-07 NOTE — Discharge Summary (Addendum)
Stroke Discharge Summary  Patient ID: Kayla Wilson   MRN: [id number]      DOB: October 22, 1940  Date of Admission: 11/04/2017 Date of Discharge: 11/07/2017  Attending Physician:  Garvin Fila, MD, Stroke MD Consultant(s):     Olene Craven) Estanislado Pandy, MD (Interventional Neuroradiologist)  Patient's PCP:  Dorothyann Peng, NP  DISCHARGE DIAGNOSIS:  Principal Problem:   CVA (cerebral vascular accident) ([place]) - small embolic infarcts d/t AF Active Problems:   Essential hypertension   Atrial fibrillation ([place])   Hyperlipidemia   Hx of hemorrhagic stroke   UTI (urinary tract infection)   Hypokalemia   Thyroid nodule   Lung density on x-ray   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 Atrial fibrillation ([place])   . Colon polyps   . Complication of anesthesia    difficulty awakening  . CVA (cerebral vascular accident) ([place]) 11/04/2017  . Diverticulosis   . Hypertension   . IATROGENIC CEREBROVASCULAR INFARCT/HEMORRHAGE NE 03/22/1993   Qualifier: Diagnosis of  By: Leanne Chang MD, [place] per pt's report-Dr Vertell Limber- hemorrhagic stroke. No intervention required    . Stroke [facility])    Past Surgical History:  Procedure Laterality Date  . APPENDECTOMY    . EYE SURGERY Left    vitrectomy  . TOTAL HIP ARTHROPLASTY Right 09/03/2014   Procedure: RIGHT TOTAL HIP ARTHROPLASTY ANTERIOR APPROACH;  Surgeon: Renette Butters, MD;  Location: [place];  Service: Orthopedics;  Laterality: Right;  . TUBAL LIGATION      Allergies as of 11/07/2017      Reactions   Ace Inhibitors Anaphylaxis   Angioedema, perioperative   Celebrex [celecoxib] Itching, Other (See Comments)   Twitching    Antihistamines, Chlorpheniramine-type Other (See Comments)   A-fib   Aspirin Other (See Comments)   History of hemorrhagic stroke   Cortisone Other (See Comments)   "cortisone shot made me jerk"   Lidocaine Other (See Comments)   Pt was fine for ~2 days after she received medication and  states she then got "sore all over and could not move"   Phenytoin Swelling   Swelling mostly in arms   Sulfonamide Derivatives Itching      Medication List    TAKE these medications   apixaban 5 MG Tabs tablet Commonly known as:  ELIQUIS Take 1 tablet (5 mg total) by mouth 2 (two) times daily.   atorvastatin 40 MG tablet Commonly known as:  LIPITOR Take 1 tablet (40 mg total) by mouth daily at 6 PM.   carvedilol 12.5 MG tablet Commonly known as:  COREG Take 1 tablet (12.5 mg total) by mouth 2 (two) times daily.   ciprofloxacin 500 MG tablet Commonly known as:  CIPRO Take 1 tablet (500 mg total) by mouth 2 (two) times daily for 2 days.   hydrochlorothiazide 25 MG tablet Commonly known as:  HYDRODIURIL Take 1 tablet (25 mg total) by mouth daily.   vitamin B-12 500 MCG tablet Commonly known as:  CYANOCOBALAMIN Take 500 mcg by mouth daily.       LABORATORY STUDIES CBC    Component Value Date/Time   WBC 13.9 (H) 11/07/2017 0323   RBC 4.63 11/07/2017 0323   HGB 13.4 11/07/2017 0323   HCT 40.4 11/07/2017 0323   PLT 204 11/07/2017 0323   MCV 87.3 11/07/2017 0323   MCH 28.9 11/07/2017 0323   MCHC 33.2 11/07/2017 0323   RDW 13.0 11/07/2017 0323  LYMPHSABS 2.2 11/06/2017 1546   MONOABS 1.4 (H) 11/06/2017 1546   EOSABS 0.0 11/06/2017 1546   BASOSABS 0.0 11/06/2017 1546   CMP    Component Value Date/Time   NA 132 (L) 11/07/2017 0323   K 4.4 11/07/2017 0323   CL 99 11/07/2017 0323   CO2 25 11/07/2017 0323   GLUCOSE 109 (H) 11/07/2017 0323   BUN 8 11/07/2017 0323   CREATININE 0.62 11/07/2017 0323   CALCIUM 8.9 11/07/2017 0323   PROT 7.2 11/04/2017 1320   ALBUMIN 3.7 11/04/2017 1320   AST 22 11/04/2017 1320   ALT 13 11/04/2017 1320   ALKPHOS 49 11/04/2017 1320   BILITOT 1.2 11/04/2017 1320   GFRNONAA >60 11/07/2017 0323   GFRAA >60 11/07/2017 0323   COAGS Lab Results  Component Value Date   INR 1.12 11/07/2017   INR 1.14 11/04/2017   INR 1.68 (H)  09/03/2014   Lipid Panel    Component Value Date/Time   CHOL 180 11/05/2017 0618   TRIG 38 11/05/2017 0618   HDL 62 11/05/2017 0618   CHOLHDL 2.9 11/05/2017 0618   VLDL 8 11/05/2017 0618   LDLCALC 110 (H) 11/05/2017 0618   HgbA1C  Lab Results  Component Value Date   HGBA1C 5.2 11/05/2017   Urinalysis    Component Value Date/Time   COLORURINE YELLOW 11/06/2017 1215   APPEARANCEUR CLOUDY (A) 11/06/2017 1215   LABSPEC 1.006 11/06/2017 1215   PHURINE 7.0 11/06/2017 1215   GLUCOSEU NEGATIVE 11/06/2017 1215   HGBUR MODERATE (A) 11/06/2017 1215   HGBUR trace-intact 11/13/2008 0902   BILIRUBINUR NEGATIVE 11/06/2017 1215   BILIRUBINUR n 12/31/2015 1257   KETONESUR NEGATIVE 11/06/2017 1215   PROTEINUR NEGATIVE 11/06/2017 1215   UROBILINOGEN 0.2 12/31/2015 1257   UROBILINOGEN 0.2 08/23/2014 1150   NITRITE NEGATIVE 11/06/2017 1215   LEUKOCYTESUR LARGE (A) 11/06/2017 1215   Urine Drug Screen     Component Value Date/Time   LABOPIA NONE DETECTED 11/04/2017 1318   COCAINSCRNUR NONE DETECTED 11/04/2017 1318   LABBENZ NONE DETECTED 11/04/2017 1318   AMPHETMU NONE DETECTED 11/04/2017 1318   THCU NONE DETECTED 11/04/2017 1318   LABBARB NONE DETECTED 11/04/2017 1318    Alcohol Level    Component Value Date/Time   ETH <10 11/04/2017 1320    SIGNIFICANT DIAGNOSTIC STUDIES Ct Head Code Stroke Wo Contrast 11/04/2017 IMPRESSION:  1. No acute stroke, hemorrhage, or mass effect.  2. Small chronic infarct in right frontal lobe and small subacute to chronic infarct in the right parietal lobe.  3. Advanced chronic microvascular ischemic changes and moderate volume loss of the brain.  4. ASPECTS is 10   Ct Angio Head W Or Wo Contrast Ct Angio Neck W Or Wo Contrast 11/04/2017 1. Proximal left M2 superior division occlusion within the sylvian fissure.  2. 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 Nonemergent CTA of the chest is recommended.  3. No  additional large vessel occlusion, aneurysm, or hemodynamically significant stenosis by NASCET criteria.  4. Thyroid goiter with nodules measuring up to 3.8 cm. Nonemergent thyroid ultrasound is recommended.   Mr Brain Wo Contrast 11/04/2017 1. Areas of signal abnormality on diffusion-weighted imaging in the left frontal white matter, without reduced apparent diffusion coefficient on the ADC map, likely indicating subacute ischemia.  2. Old right frontal and right parietal infarcts.  3. Atrophy and severe chronic microvascular ischemia.   Ct Cerebral Perfusion W Contrast 11/05/2017 Negative CT perfusion with no evidence for  core infarct. Elevated MTT within the left MCA distribution, likely related to known left M2 occlusion.  Cerebral Angiogram - IR - Dr Estanislado Pandy - no intervention 11/05/2017 S/P arch aortogram and Lt subclavian arteriogram RT CFA approach. Findings. 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 Origins of the common carotid arteries not identified by arch aortogram. Vertebral arteries noted bilaterally with antegrade flow.  Ct Head Wo Contrast 11/05/2017 Stable head CT. No complication identified status post recent arteriogram. No appreciable new or evolving infarct identified.   Ct Head Wo Contrast 11/05/2017   ADDENDUM REPORT: 11/05/2017 02:12 ASPECTS determined to be 10.  11/05/2017 IMPRESSION:  1. Stable appearance of the head with no new acute intracranial abnormality identified.  2. 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  3. Advanced chronic microvascular ischemic disease with moderate cerebral atrophy.   MRI Brain Wo Contrast - repeat 11/05/2017 IMPRESSION: 1. Small areas of acute infarct in the left superior cerebellum and left superior insular cortex were not seen on the MRI yesterday. 2. No change in small areas of acute infarct in the left frontal white matter  and left parietal cortex. 3. Advanced chronic microvascular ischemia with multiple areas of intracranial chronic hemorrhage.  Transthoracic Echocardiogram  - Left ventricle: The cavity size was normal. Systolic function wasnormal. The estimated ejection fraction was in the range of 50%to 55%. Wall motion was normal; there were no regional wallmotion abnormalities. - Aortic valve: There was mild to moderate regurgitation.Regurgitation pressure half-time: 500 ms. - Mitral valve: There was mild regurgitation. - Left atrium: The atrium was moderately dilated. - Right ventricle: Systolic function was mildly reduced. - Right atrium: The atrium was moderately dilated. - Atrial septum: No defect or patent foramen ovale was identified. - Tricuspid valve: There was mild regurgitation. - Pulmonic valve: There was trivial regurgitation. Pulmonary arteries: Systolic pressure was moderately increased. PA peak pressure: 44 mm Hg (S). Impressions: No cardiac source of emboli was indentified.  Ct Angio Chest Aorta W/cm &/or Wo/cm 11/07/2017 1.9 x 1.4 cm focal density is noted anteriorly in right upper lobe with surrounding ground-glass opacity. This may recently represent focal pneumonia, but neoplasm cannot be excluded. Smaller opacities are noted throughout both lungs also suggesting pneumonia or possibly malignancy. Follow-up unenhanced chest CT in 2-3 weeks is recommended to ensure resolution of these abnormalities and rule out potential underlying malignancy. 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 Right subclavian and right common carotid arteries arise separately from the larger right-sided arch, with the left common and subclavian arteries arising separately from the significantly smaller left-sided aortic arch. 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 Follow-up CTA in 12 months is recommended to ensure stability. 4.6 cm right thyroid mass is noted. Thyroid ultrasound is recommended for further evaluation. Moderate size sliding-type hiatal hernia is noted. Aortic Atherosclerosis (ICD10-I70.0).   US Thyroid 11/06/2017 Bilateral thyroid nodules. Bilateral dominant thyroid nodules, nodule #1 and nodule #3 both meet criteria for ultrasound-guided biopsy.      HISTORY OF PRESENT ILLNESS Kayla Wilson is an 77 y.o. female with PMH significant for afib ( not on anti- coagulation, pt refuses), CVA ( 1995; hemorrhagic), HTN who presented to [facility] as a code stroke for right facial droop and left arm flaccid per report.  Patient was out at  the grocery store with family when about 12:10 11/04/2017 they noticed a sudden onset of facial droop, dysarthria, and left arm was flaccid and she had some confusion that lasted about 3 minutes. EMS was called and code stroke was initiated in route to [facility]. Denies any CP, SOB, HA, dizziness, vision changes. Patient also stated that she would not take any blood thinners in the future.  BP: 180/110,  NA: 130, Potassium: 3.1, chloride: 89, BUN 4, BG:111, CT head:  No acute stroke, hemorrhage. CTA: Proximal left M2 superior division occlusion within the sylvian fissure. No LVO.  Pt w/ hx 1995 of a hemorrhagic stroke. Modified Rankin: Rankin Score=0   tPA not Given - patient declined treatment  Following admission, NIH stroke scale worsened to a 82 with aphasia and right hemiparesis and neglect.  Stat CT perfusion completed.  Proceeded to endovascular were attempted for NGO was performed, however, unable to access carotids due to aortic aneurysm.  No intervention provided.  Patient admitted to the neuro ICU.   HOSPITAL COURSE Kayla Wilson is a 77 y.o. female with history of afib ( not on anti- coagulation, pt refuses), CVA ( 1995; hemorrhagic), HTN,  presenting with right  facial droop and left arm flaccid. She did not receive IV t-PA  (pt. Declined.). Attempted IR but could not be accessed d/t double aortic arches.  Stroke:    Small L cerebellar and small L insular infarcts - embolic secondary to known afib in setting of previous R brain ICH.  CT head - stable  MRI head -  subacute ischemia left frontal white matter.  MRI Brain - repeat - Small areas of acute infarct in the left superior cerebellum and left superior insular cortex were not seen on the MRI yesterday.  CTA H&N - Proximal left M2 superior division occlusion within the sylvian fissure.   Angio distorted origin of subclavians and dilated arch  2D Echo - EF 50-55%. No source of embolus   LDL - 110  HgbA1c - 5.2  VTE prophylaxis - SCDs  Diet - Heart healthy with thin liquids.  No antithrombotic prior to admission, now on clopidogrel 75 mg daily and warfarin daily currently. Plan to continue plavix until INR therapeutic, then d/c. Continue warfarin for now. Eliquis recommended for decreased risk; patient considering Eliquis. Will let us know if/when she changes her mind-- see below  Discuss Watchman device at time of stroke follow up since long term anticoagulation might be problematic in this pt with history of hemorrhagic CVA and multiple areas of intracranial chronic hemorrhage on MRI. [facility] no longer offers.  Therapy recommendations:  No PT, no OT  Disposition:  Return home  D/c later today  Atrial Fibrillation  Home anticoagulation:  none   Now on warfarin  INR 1.12  D/c plavix once INR therapeutic  Pt now agreeable to eliquis->d/c plavix and warfarin  Continue Eliquis (apixaban) daily at discharge  Hypertension  BP stable  Resume home HCTZ and coreg  Long-term BP goal normotensive  Hyperlipidemia  Lipid lowering medication PTA:  none  LDL 110, goal < 70  Current lipid lowering medication: Lipitor 40 mg daily  Continue statin at  discharge  Other Stroke Risk Factors  Advanced age  Hx stroke/TIA  46 ICH  Family hx stroke (Mother)  Atrial fib  Other Active Problems  Hypokalemia, resolved - supplement and recheck -> 2.9 -> 3.9->4.4  Hyponatremia - monitor - 127->132  Aneurysm of the aortic arch measuring at least  4 cm, incompletely visualized. Nonemergent CTA of the chest shows focal density RUL with surrounding groundglass opacity question focal pneumonia but cannot rule out cancer.  Follow-up enhanced chest CT in 2 to 3 weeks recommended.  Congenital double aortic arch anomaly.  4.6 cm right thyroid mass.  Moderate hiatal hernia.  Aortic atherosclerosis  Thyroid goiter with nodules measuring up to 3.8 cm. Nonemergent thyroid ultrasound confirms B thryoid nodules, #1 and #3 meet criterial for US guided bx . Can do as OP. Defer to PCP.  UTI - Leukocytosis, WBCs 20.4 ->13.9 Afebrile. U/A lg leuk and 21-50 WBC - started on Rocephin - received 1 dose - will change to Cipro for total 3 days   DISCHARGE EXAM per Dr. Leonie Man Blood pressure (!) 163/93, pulse 90, temperature 97.6 F (36.4 C), temperature source Tympanic, resp. rate (!) 22, height 5\' 8"  (1.727 m), weight 57.6 kg, SpO2 100 %. L facial droop PERRL AA0x4 Strength 5/5 throughout Intact sensation to light touch and pain throuhout No ataxia  Discharge Diet   Heart healthy thin liquids  DISCHARGE PLAN  Disposition:  Home w/ family  Follow-up chest CT in 2 to 3 weeks to look at right upper lobe density -defer to PCP  Outpatient thyroid nodule x 2 biopsies -defer to PCP  Eliquis (apixaban) daily for secondary stroke prevention.  Consider watchman device given history of intracerebral hemorrhage  Ongoing risk factor control by Primary Care Physician at time of discharge  Follow-up Nafziger, Tommi Rumps, NP in 2 weeks.  Follow-up in [facility] in 4 weeks, office to schedule an appointment.   45 minutes were spent  preparing discharge.  Burnetta Sabin, MSN, APRN, ANVP-BC, AGPCNP-BC Advanced Practice Stroke Nurse [place] for Schedule & Pager information 11/07/2017 2:59 PM   I have personally examined this patient, reviewed notes, independently viewed imaging studies, participated in medical decision making and plan of care.ROS completed by me personally and pertinent positives fully documented  I have made any additions or clarifications directly to the above note. Agree with note above.   Antony Contras, MD Medical Director [place] Pager: [phone] 11/07/2017 5:21 PM

## 2017-11-07 NOTE — Care Management Note (Signed)
Case Management Note  Patient Details  Name: Kayla Wilson MRN: [id number] Date of Birth: 1941/02/21  Subjective/Objective:          77 y.o. female admitted on 11/04/17 for R facial droop and left arm weakness.  MRI revealed small areas of acute infarct (presumed cardioembolic in the L superior cerebellum and left superior insular cortex.  PTA, pt independent, lives at home with husband and son.          Action/Plan: Met with pt and family to discuss Eliquis coverage:   11-07-17 BENEFITS  CHECK :  # 2.  S/W EBONY @ [facility] # [id number]  ELIQUIS  5 MG BID  COVER- YES CO-PAY- $ 140.00 (90 day supply); $45 for 30 day supply, per family TIER- 3 DRUG PRIOR APPROVAL- NO  DEDUCTIBLE - NOT MET  PREFERRED PHARMACY : YES    [facility]          Pt given 30 day free trial card for first Rx.  PT/OT recommending no OP follow up.  Family able to provide 24h care at dc.  Expected Discharge Date:  11/07/17               Expected Discharge Plan:     In-House Referral:     Discharge planning Services     Post Acute Care Choice:    Choice offered to:     DME Arranged:    DME Agency:     HH Arranged:    HH Agency:     Status of Service:     If discussed at [employer], dates discussed:    Additional Comments:  Reinaldo Raddle, RN, BSN  Trauma/Neuro ICU Case Manager [phone]

## 2017-11-07 NOTE — Progress Notes (Addendum)
[facility] STROKE TEAM PROGRESS NOTE   SUBJECTIVE (INTERVAL HISTORY) Patient lying in bed, "trying to learn your name". Son and husband at bedside. Dr. Leonie Man discussed risk and benefits of all anticoagulants  - she leans toward coumadin but is willing to consider a newer anticoagulant - she wants to think about it. She is concerned because she is allergic to a lot of meds, plus her brother was on a DOAC and it didn't work for him.  HR variable. Not on home meds. Will resume home meds, watch heartrate and hop for d/c later today. Will need OP follow up of medical issues.   RN called following rounds and stated pt now agrees to change to Eliquis. Pharmacist consulted.   OBJECTIVE Vitals:   11/07/17 0500 11/07/17 0600 11/07/17 0700 11/07/17 0800  BP: (!) 154/107 (!) 154/118 (!) 148/91   Pulse: (!) 54 76 87   Resp: (!) 22 15 14    Temp:    (!) 97.5 F (36.4 C)  TempSrc:    Oral  SpO2: 99% 99% 98%   Weight:      Height:        CBC:  Recent Labs  Lab 11/04/17 1320  11/06/17 1546 11/07/17 0323  WBC 6.4   < > 17.9* 13.9*  NEUTROABS 3.0  --  14.0*  --   HGB 13.6   < > 13.3 13.4  HCT 41.3   < > 39.7 40.4  MCV 87.7   < > 86.5 87.3  PLT 211   < > 209 204   < > = values in this interval not displayed.    Basic Metabolic Panel:  Recent Labs  Lab 11/06/17 0651 11/07/17 0323  NA 127* 132*  K 3.9 4.4  CL 95* 99  CO2 25 25  GLUCOSE 123* 109*  BUN 9 8  CREATININE 0.54 0.62  CALCIUM 8.7* 8.9    Lipid Panel:     Component Value Date/Time   CHOL 180 11/05/2017 0618   TRIG 38 11/05/2017 0618   HDL 62 11/05/2017 0618   CHOLHDL 2.9 11/05/2017 0618   VLDL 8 11/05/2017 0618   LDLCALC 110 (H) 11/05/2017 0618   HgbA1c:  Lab Results  Component Value Date   HGBA1C 5.2 11/05/2017   Urine Drug Screen:     Component Value Date/Time   LABOPIA NONE DETECTED 11/04/2017 1318   COCAINSCRNUR NONE DETECTED 11/04/2017 1318   LABBENZ NONE DETECTED 11/04/2017 1318   AMPHETMU NONE DETECTED  11/04/2017 1318   THCU NONE DETECTED 11/04/2017 1318   LABBARB NONE DETECTED 11/04/2017 1318    Alcohol Level     Component Value Date/Time   ETH <10 11/04/2017 1320    IMAGING Ct Head Code Stroke Wo Contrast 11/04/2017 IMPRESSION:  1. No acute stroke, hemorrhage, or mass effect.  2. Small chronic infarct in right frontal lobe and small subacute to chronic infarct in the right parietal lobe.  3. Advanced chronic microvascular ischemic changes and moderate volume loss of the brain.  4. ASPECTS is 10   Ct Angio Head W Or Wo Contrast Ct Angio Neck W Or Wo Contrast 11/04/2017 1. Proximal left M2 superior division occlusion within the sylvian fissure.  2. 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 Nonemergent CTA of the chest is recommended.  3. No additional large vessel occlusion, aneurysm, or hemodynamically significant stenosis by NASCET criteria.  4. Thyroid goiter with nodules measuring up to 3.8 cm. Nonemergent thyroid ultrasound is recommended.  Mr Brain Wo Contrast 11/04/2017 1. Areas of signal abnormality on diffusion-weighted imaging in the left frontal white matter, without reduced apparent diffusion coefficient on the ADC map, likely indicating subacute ischemia.  2. Old right frontal and right parietal infarcts.  3. Atrophy and severe chronic microvascular ischemia.   Ct Cerebral Perfusion W Contrast 11/05/2017 Negative CT perfusion with no evidence for core infarct. Elevated MTT within the left MCA distribution, likely related to known left M2 occlusion.  Cerebral Angiogram - IR - Dr Estanislado Pandy - no intervention 11/05/2017 S/P arch aortogram and Lt subclavian arteriogram RT CFA approach. Findings. 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 Origins of  the common carotid arteries not identified by arch aortogram. Vertebral arteries noted bilaterally with antegrade  flow.  Ct Head Wo Contrast 11/05/2017 Stable head CT. No complication identified status post recent arteriogram. No appreciable new or evolving infarct identified.   Ct Head Wo Contrast 11/05/2017   ADDENDUM REPORT: 11/05/2017 02:12 ASPECTS determined to be 10.  11/05/2017 IMPRESSION:  1. Stable appearance of the head with no new acute intracranial abnormality identified.  2. 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  3. Advanced chronic microvascular ischemic disease with moderate cerebral atrophy.   MRI Brain Wo Contrast - repeat 11/05/2017 IMPRESSION: 1. Small areas of acute infarct in the left superior cerebellum and left superior insular cortex were not seen on the MRI yesterday. 2. No change in small areas of acute infarct in the left frontal white matter and left parietal cortex. 3. Advanced chronic microvascular ischemia with multiple areas of intracranial chronic hemorrhage.  Transthoracic Echocardiogram  - Left ventricle: The cavity size was normal. Systolic function was normal. The estimated ejection fraction was in the range of 50% to 55%. Wall motion was normal; there were no regional wall motion abnormalities. - Aortic valve: There was mild to moderate regurgitation. Regurgitation pressure half-time: 500 ms. - Mitral valve: There was mild regurgitation. - Left atrium: The atrium was moderately dilated. - Right ventricle: Systolic function was mildly reduced. - Right atrium: The atrium was moderately dilated. - Atrial septum: No defect or patent foramen ovale was identified. - Tricuspid valve: There was mild regurgitation. - Pulmonic valve: There was trivial regurgitation. Pulmonary arteries: Systolic pressure was moderately increased.   PA peak pressure: 44 mm Hg (S). Impressions: No cardiac source of emboli was indentified.  Ct Angio Chest Aorta W/cm &/or Wo/cm 11/07/2017 1.9 x 1.4 cm focal density is noted anteriorly in right upper lobe with  surrounding ground-glass opacity. This may recently represent focal pneumonia, but neoplasm cannot be excluded. Smaller opacities are noted throughout both lungs also suggesting pneumonia or possibly malignancy. Follow-up unenhanced chest CT in 2-3 weeks is recommended to ensure resolution of these abnormalities and rule out potential underlying malignancy. 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 Right subclavian and right common carotid arteries arise separately from the larger right-sided arch, with the left common and subclavian arteries arising separately from the significantly smaller left-sided aortic arch. 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 Follow-up CTA in 12 months is recommended to ensure stability. 4.6 cm right thyroid mass is noted. Thyroid ultrasound is recommended for further evaluation. Moderate size sliding-type hiatal hernia is noted. Aortic Atherosclerosis (ICD10-I70.0).   US Thyroid 11/06/2017 Bilateral thyroid nodules. Bilateral  dominant thyroid nodules, nodule #1 and nodule #3 both meet criteria for ultrasound-guided biopsy.     PHYSICAL EXAM Isn't frail elderly Caucasian lady not in distress. . Afebrile. Head is nontraumatic. Neck is supple without bruit.    Cardiac exam no murmur or gallop. Lungs are clear to auscultation. Distal pulses are well felt. Neurological Exam ;  Awake  Alert oriented x 3. Normal speech and language.slightly diminished attention, registration and recall..eye movements full without nystagmus.fundi were not visualized. Vision acuity and fields appear normal. Hearing is normal. Palatal movements are normal. Face symmetric. Tongue midline. Normal strength, tone, reflexes and coordination. Normal sensation. Gait deferred.   ASSESSMENT/PLAN Kayla Wilson is a 77  y.o. female with history of afib ( not on anti- coagulation, pt refuses), CVA ( 1995; hemorrhagic), HTN,  presenting with right facial droop and left arm flaccid. She did not receive IV t-PA  (pt. Declined.). Attempted IR but could not be accessed d/t double aortic arches.  Stroke:    Small L cerebellar and small L insular infarcts - embolic secondary to known afib in setting of previous R brain ICH.  CT head - stable  MRI head -  subacute ischemia left frontal white matter.  MRI Brain - repeat - Small areas of acute infarct in the left superior cerebellum and left superior insular cortex were not seen on the MRI yesterday.  CTA H&N - Proximal left M2 superior division occlusion within the sylvian fissure.   Angio distorted origin of subclavians and dilated arch  2D Echo - EF 50-55%. No source of embolus   LDL - 110  HgbA1c - 5.2  VTE prophylaxis - SCDs  Diet - Heart healthy with thin liquids.  No antithrombotic prior to admission, now on clopidogrel 75 mg daily and warfarin daily currently. Plan to continue plavix until INR therapeutic, then d/c. Continue warfarin for now. Eliquis recommended for decreased risk; patient considering Eliquis. Will let us know if/when she changes her mind-- see below  Discuss Watchman device at time of stroke follow up since long term anticoagulation might be problematic in this pt with history of hemorrhagic CVA and multiple areas of intracranial chronic hemorrhage on MRI. Kayla Wilson no longer offers.  Therapy recommendations:  No PT, no OT  Disposition:  Return home  D/c later today  Atrial Fibrillation  Home anticoagulation:  none   Now on warfarin  INR 1.12  D/c plavix once INR therapeutic  Pt now agreeable to eliquis->d/c plavix and warfarin . Continue Eliquis (apixaban) daily at discharge  Hypertension  BP stable . Resume home HCTZ and coreg . 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 Nonemergent CTA of the chest shows focal density RUL with surrounding groundglass opacity question focal pneumonia but cannot rule out cancer.  Follow-up enhanced chest CT in 2 to 3 weeks recommended.  Congenital double aortic arch anomaly.  4.6 cm right thyroid mass.  Moderate hiatal hernia.  Aortic atherosclerosis  Thyroid goiter with nodules measuring up to 3.8 cm. Nonemergent thyroid ultrasound  confirms B thryoid nodules, #1 and #3 meet criterial for US guided bx . Can do as OP. Defer to PCP.  UTI - Leukocytosis, WBCs 20.4 ->13.9 Afebrile. U/A lg leuk and 21-50 WBC - started on Rocephin - received 1 dose - will change to Cipro for total 3 days  Hospital day # 2  Burnetta Sabin, MSN, APRN, ANVP-BC, AGPCNP-BC Advanced Practice Stroke Nurse [place] for Schedule & Pager information 11/07/2017 2:33 PM  I have personally examined this patient, reviewed notes, independently viewed imaging studies, participated in medical decision making and plan of care.ROS completed by me personally and pertinent positives fully documented  I have made any additions or clarifications directly to the above note. Agree with note above.   Antony Contras, MD Medical Director [facility] Stroke Center Pager: [phone] 11/07/2017 5:23 PM  To contact Stroke Continuity provider, please refer to [email or web address] After hours, contact General Neurology

## 2017-11-07 NOTE — Care Management (Signed)
11-07-17 BENEFITS  CHECK :  # 2.  S/W EBONY @ [facility] # [phone]  ELIQUIS  5 MG BID  COVER- YES CO-PAY- $ 140.00 TIER- 3 DRUG PRIOR APPROVAL- NO  DEDUCTIBLE - NOT MET  PREFERRED PHARMACY : YES    [facility]

## 2017-11-07 NOTE — Progress Notes (Signed)
[facility] for Apixaban Indication: atrial fibrillation   Assessment: 64 yof with hx of afib, CVA not on anticoagulation due to pt refusal, admitted with CVA. Patient now agreeable to start Apixaban   Plan:  D/c Warfarin Start Apixaban 5 mg po bid Monitor CBC, s/sx bleeding   Alanda Slim, PharmD, [facility] Clinical Pharmacist Please see AMION for all Pharmacists' Contact Phone Numbers 11/07/2017, 11:48 AM

## 2017-11-07 NOTE — Progress Notes (Signed)
[place] for warfarin Indication: atrial fibrillation   Assessment: 25 yof with hx of afib, CVA not on anticoagulation due to pt refusal, admitted with CVA. Patient now agreeable to start warfarin, and Pharmacy consulted to dose (would not agree to Eliquis or Xarelto per RN note). Baseline INR 1.14 from 8/16. CBC wnl. No bleed issues documented.  8/19 INR 1.12  Goal of Therapy:  INR 2-3 Monitor platelets by anticoagulation protocol: Yes   Plan:  Warfarin 5mg  PO x 1 dose Daily INR Monitor CBC, s/sx bleeding   Alanda Slim, PharmD, [facility] Clinical Pharmacist Please see AMION for all Pharmacists' Contact Phone Numbers 11/07/2017, 8:00 AM

## 2017-11-07 NOTE — Progress Notes (Addendum)
Physical Therapy Treatment Patient Details Name: Kayla Wilson MRN: [id number] DOB: 06-Nov-1940 Today's Date: 11/07/2017    History of Present Illness 77 y.o. female admitted on 11/04/17 for R facial droop and left arm weakness.  MRI revealed small areas of acute infarct (presumed cardioembolic in the L superior cerebellum and left superior insular cortex.  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  Pt with significant PMH of hemorrhagic stroke, HTN, A-fib, and R THA.    PT Comments    Pt pleasant on arrival with husband present, eager to walk around and return home. Pt vitals take prior to session in sitting HR94, SpO2 98, RR 16, BP L arm 157/101 (119). During gait HR ranged 115-155 with quick recovery during rest. Pt denied SOB, dizzy, w/ only feeling "out of shape", pt believed due to stoppage of meds while in hospital. Nurse made aware of HR spike and reported this has been the case regardless of position or activity. Pt vitals taken in sitting immediately following session: HR 104- SpO2 100%, RR 19, BP L arm 147/89 (107). Pt reports ambulation is close to baseline, with good self-managed energy conservation.      Follow Up Recommendations  No PT follow up;Supervision for mobility/OOB     Equipment Recommendations  None recommended by PT    Recommendations for Other Services       Precautions / Restrictions Precautions Precautions: Fall Precaution Comments: pt uses cane at baseline, doesn't report h/o falls Restrictions Weight Bearing Restrictions: No    Mobility  Bed Mobility               General bed mobility comments: pt in chair on arrival   Transfers Overall transfer level: Modified independent Equipment used: Straight cane Transfers: Sit to/from Stand Sit to Stand: Min guard         General transfer comment: Guarding assist for safety.    Ambulation/Gait Ambulation/Gait assistance: Min guard Gait Distance (Feet): 200 Feet Assistive device: Straight cane Gait Pattern/deviations: Step-through pattern;Narrow base of support;Drifts right/left Gait velocity: decreased  Gait velocity interpretation: 1.31 - 2.62 ft/sec, indicative of limited community ambulator General Gait Details: Pt with single point cane on L, slight unsteadiness with occassional drift, able to self correct. Required 2 standing breaks after ~50 ft, reported would normally sit at home.    Stairs Stairs: Yes Stairs assistance: Min guard Stair Management: One rail Right Number of Stairs: 10 General stair comments: use of R rail, overall steady, required break prior to decent. Pt reports has 2 flights of stairs at home w/ R rail that frequently goes up/down for laundry, etc. but bedroom on mainlevel.   Wheelchair Mobility    Modified Rankin (Stroke Patients Only) Modified Rankin (Stroke Patients Only) Pre-Morbid Rankin Score: Slight disability Modified Rankin: Moderately severe disability     Balance Overall balance assessment: Needs assistance Sitting-balance support: No upper extremity supported;Feet supported Sitting balance-Leahy Scale: Good     Standing balance support: Single extremity supported Standing balance-Leahy Scale: Poor Standing balance comment: relies on cane in standing                             Cognition Arousal/Alertness: Awake/alert Behavior During Therapy: WFL for tasks assessed/performed Overall Cognitive Status: Impaired/Different from baseline Area of Impairment: Safety/judgement  Safety/Judgement: Decreased awareness of safety;Decreased awareness of deficits Awareness: Emergent   General Comments: Pt likely close to baseline. However, do note some decreased safety awareness and awareness in general.       Exercises      General Comments        Pertinent  Vitals/Pain Pain Assessment: No/denies pain    Home Living                      Prior Function            PT Goals (current goals can now be found in the care plan section) Progress towards PT goals: Progressing toward goals    Frequency    Min 4X/week      PT Plan Current plan remains appropriate    Co-evaluation              AM-PAC PT "6 Clicks" Daily Activity  Outcome Measure    Difficulty moving from lying on back to sitting on the side of the bed? : A Little Difficulty sitting down on and standing up from a chair with arms (e.g., wheelchair, bedside commode, etc,.)?: A Little Help needed moving to and from a bed to chair (including a wheelchair)?: A Little Help needed walking in hospital room?: A Little Help needed climbing 3-5 steps with a railing? : A Little 6 Click Score: 15    End of Session Equipment Utilized During Treatment: Gait belt Activity Tolerance: Patient tolerated treatment well Patient left: in chair;with family/visitor present;with call bell/phone within reach Nurse Communication: Mobility status PT Visit Diagnosis: Unsteadiness on feet (R26.81);Difficulty in walking, not elsewhere classified (R26.2)     Time: 3662-9476 PT Time Calculation (min) (ACUTE ONLY): 17 min  Charges:  $Gait Training: 8-22 mins                     Samuella Bruin, [place]  Acute Rehab [phone]    Samuella Bruin 11/07/2017, 2:50 PM

## 2017-11-07 NOTE — Progress Notes (Signed)
Provided discharge instructions to patient and her son. Took patient down and placed in car with son.  Katherine Mantle RN

## 2017-11-09 ENCOUNTER — Telehealth: Payer: Self-pay | Admitting: *Deleted

## 2017-11-09 ENCOUNTER — Encounter (HOSPITAL_COMMUNITY): Payer: Self-pay | Admitting: Interventional Radiology

## 2017-11-09 NOTE — Telephone Encounter (Signed)
Transition Care Management Follow-up Telephone Call  Per Discharge Summary:  Date of Admission: 11/04/2017 Date of Discharge: 11/07/2017  Attending Physician:  Garvin Fila, MD, Stroke MD Consultant(s):     Olene Craven) Estanislado Pandy, MD (Interventional Neuroradiologist)  Patient's PCP:  Dorothyann Peng, NP  DISCHARGE DIAGNOSIS:  Principal Problem:   CVA (cerebral vascular accident) ([place]) - small embolic infarcts d/t AF Active Problems:   Essential hypertension   Atrial fibrillation ([place])   Hyperlipidemia   Hx of hemorrhagic stroke   UTI (urinary tract infection)   Hypokalemia   Thyroid nodule   Lung density on x-ray   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 "My husband and sons is bossy."   Do you understand why you were in the hospital? yes   Do you understand the discharge instructions? yes   Where were you discharged to? Home   Items Reviewed:  Medications reviewed: yes  Allergies reviewed: yes  Dietary changes reviewed: yes  Referrals reviewed: yes, [facility] on 12/08/17   Functional Questionnaire:   Activities of Daily Living (ADLs):   She states they are independent in the following: ambulation, bathing and hygiene, feeding, continence, grooming, toileting and dressing States they require assistance with the following: none   Any transportation issues/concerns?: no   Any patient concerns? no   Confirmed importance and date/time of follow-up visits scheduled yes  Provider Appointment booked with Dorothyann Peng, NP 11/11/17 @ 11:00am  Confirmed with patient if condition begins to worsen call PCP or go to the ER.  Patient was given the office number and encouraged to call back with question or concerns.  : yes

## 2017-11-10 ENCOUNTER — Other Ambulatory Visit: Payer: Self-pay

## 2017-11-10 NOTE — Patient Outreach (Signed)
[facility]) Care Management  11/10/2017  Kayla Wilson 01/27/1941 [id number]   EMMI- Stroke RED ON EMMI ALERT Day # 1 Date: 11/10/17 Red Alert Reason: Feeling worse overall? Yes   Outreach attempt: Spoke with patient.  She is able to verify HIPAA. Discussed reason for referral.  Patient states that the system did not record her answer correct and had that it had a hard time hearing her.  Patient states she is doing much better and better than she thought.  Patient has a physician appointment on tomorrow with her PCP.  She states she has transportation to her appointment.    Discussed with patient [facility] services.  Patient declined services at this time.    Plan: RN CM will follow up with patient within the next 3 weeks.    Jone Baseman, RN, MSN [facility] Care Management Care Management Coordinator Direct Line [phone] Toll Free: [phone]  Fax: [phone]

## 2017-11-11 ENCOUNTER — Telehealth: Payer: Self-pay

## 2017-11-11 ENCOUNTER — Ambulatory Visit (INDEPENDENT_AMBULATORY_CARE_PROVIDER_SITE_OTHER): Payer: Medicare Other | Admitting: Adult Health

## 2017-11-11 ENCOUNTER — Encounter: Payer: Self-pay | Admitting: Adult Health

## 2017-11-11 ENCOUNTER — Other Ambulatory Visit: Payer: Self-pay | Admitting: Adult Health

## 2017-11-11 VITALS — BP 124/82 | HR 72 | Temp 97.7°F | Wt 125.4 lb

## 2017-11-11 DIAGNOSIS — I10 Essential (primary) hypertension: Secondary | ICD-10-CM

## 2017-11-11 DIAGNOSIS — I4821 Permanent atrial fibrillation: Secondary | ICD-10-CM

## 2017-11-11 DIAGNOSIS — E041 Nontoxic single thyroid nodule: Secondary | ICD-10-CM

## 2017-11-11 DIAGNOSIS — E782 Mixed hyperlipidemia: Secondary | ICD-10-CM

## 2017-11-11 DIAGNOSIS — Z8673 Personal history of transient ischemic attack (TIA), and cerebral infarction without residual deficits: Secondary | ICD-10-CM | POA: Diagnosis not present

## 2017-11-11 DIAGNOSIS — D492 Neoplasm of unspecified behavior of bone, soft tissue, and skin: Secondary | ICD-10-CM

## 2017-11-11 DIAGNOSIS — J984 Other disorders of lung: Secondary | ICD-10-CM | POA: Diagnosis not present

## 2017-11-11 DIAGNOSIS — I6300 Cerebral infarction due to thrombosis of unspecified precerebral artery: Secondary | ICD-10-CM

## 2017-11-11 DIAGNOSIS — I482 Chronic atrial fibrillation: Secondary | ICD-10-CM | POA: Diagnosis not present

## 2017-11-11 LAB — CBC WITH DIFFERENTIAL/PLATELET
BASOS PCT: 0.4 % (ref 0.0–3.0)
Basophils Absolute: 0 10*3/uL (ref 0.0–0.1)
EOS ABS: 0.1 10*3/uL (ref 0.0–0.7)
Eosinophils Relative: 1 % (ref 0.0–5.0)
HCT: 42.8 % (ref 36.0–46.0)
HEMOGLOBIN: 14.5 g/dL (ref 12.0–15.0)
LYMPHS ABS: 1.5 10*3/uL (ref 0.7–4.0)
Lymphocytes Relative: 21.3 % (ref 12.0–46.0)
MCHC: 33.8 g/dL (ref 30.0–36.0)
MCV: 87.8 fl (ref 78.0–100.0)
MONO ABS: 1.1 10*3/uL — AB (ref 0.1–1.0)
Monocytes Relative: 15.2 % — ABNORMAL HIGH (ref 3.0–12.0)
NEUTROS ABS: 4.3 10*3/uL (ref 1.4–7.7)
NEUTROS PCT: 62.1 % (ref 43.0–77.0)
PLATELETS: 278 10*3/uL (ref 150.0–400.0)
RBC: 4.88 Mil/uL (ref 3.87–5.11)
RDW: 13.4 % (ref 11.5–15.5)
WBC: 7 10*3/uL (ref 4.0–10.5)

## 2017-11-11 LAB — BASIC METABOLIC PANEL
BUN: 10 mg/dL (ref 6–23)
CALCIUM: 9.7 mg/dL (ref 8.4–10.5)
CO2: 32 mEq/L (ref 19–32)
Chloride: 91 mEq/L — ABNORMAL LOW (ref 96–112)
Creatinine, Ser: 0.76 mg/dL (ref 0.40–1.20)
GFR: 78.33 mL/min (ref >60.00)
Glucose, Bld: 93 mg/dL (ref 70–99)
Potassium: 3.9 mEq/L (ref 3.5–5.1)
SODIUM: 129 meq/L — AB (ref 135–145)

## 2017-11-11 NOTE — Progress Notes (Signed)
Subjective:    Patient ID: Kayla Wilson, female    DOB: Sep 08, 1940, 77 y.o.   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 Her son is with her at this visit.   She presents to the office today for TCM visit   Admit Date: 11/04/2017  Discharge Date 11/07/2017   Presented to [facility] as a code stroke for right facial droop and left arm flaccid.  Per ER note patient was out of the grocery store with family when they noticed sudden onset of facial droop, dysarthria, and a flaccid left arm.  She did have some confusion that lasted about 3 minutes.  Imaging:   CT Head wo Contrast  -No acute stroke, hemorrhage, mass-effect -Small chronic infarct in right frontal lobe and small subacute to chronic infarct in the right parietal lobe -Dense chronic microvascular ischemic changes and moderate volume loss of the brain  CT Angio Head w or wo contrast  -Proximal left M2 superior division occlusion within the sylvian fissure -There is some of the aortic arch measuring at least 4 cm, incompletely visualized -No additional large vessel occlusion, aneurysm, or hemodynamically significant stenosis -Thyroid goiter with nodules measuring up to 3.8 cm.  Nonemergent thyroid ultrasound is recommended  MR Brain wo Contrast  -Areas of signal abnormality on diffusion-weighted imaging in the left frontal white matter, without reduced apparent diffusion coefficient on the ADC map, likely indicating subacute ischemia -The right frontal and right parietal infarcts -Atrophy and severe chronic microvascular ischemia  Transthoracic Echocardiogram  -Mated EF in the range of 50 to 55%.  Wall motion was normal.  There was mild to moderate  regurgitation with the aortic valve.  There is mild regurgitation mitral valve.  Left atrium and right atrium both mildly dilated.  There was mild regurgitation with tricuspid valve  CT Angio Chest  IMPRESSION: 1.9 x 1.4 cm focal density is noted anteriorly in right upper lobe with surrounding ground-glass opacity. This may recently represent focal pneumonia, but neoplasm cannot be excluded. Smaller opacities are noted throughout both lungs also suggesting pneumonia or possibly malignancy. Follow-up unenhanced chest CT in 2-3 weeks is recommended to ensure resolution of these abnormalities and rule out potential underlying malignancy.  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 Right subclavian and right common carotid arteries arise separately from the larger right-sided arch, with the left common and subclavian arteries arising separately from the significantly smaller left-sided aortic arch. 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 Follow-up CTA in 12 months is recommended to ensure stability.  4.6 cm right thyroid mass is noted. Thyroid ultrasound is recommended for further evaluation.  Hospital Course   1. Stroke -small left cerebral and small left insular infarcts-Biologics secondary to known A. fib in setting of previous right brain ICH -TPA was not given patient declined treatment -Was placed on Plavix until INR was therapeutic.  Eliquis was recommended and patient was agreeable upon discharge.  DC Plavix and Coumadin -She is to follow-up with [facility] stroke clinic in 4 weeks.  Will discuss watchman device at this visit  2. Afib  -Continue with Eliquis  3. Hypertension  -  BP stable.  Continue with home meds  4. Hyperlipidemia  - start Lipitor 40 mg- prior to  discharge   5. Right lung love density  -Follow-up enhanced chest CT in 2 to 3 weeks was recommended- outpatient   6. Thyroid Goiter  -Recommend outpatient ultrasound-guided biopsy  7. UTI  -Started on Rocephin, received 1 dose -Changed to Cipro for total of 3 days  Today in the office she reports that she is feeling "fine".  She feels as though she is getting her strength back after the recent hospitalization.  Has no complaints except for cost of Eliquis, she is in the donut hole and will be a $500 out-of-pocket expense.  Denies any chest pain, shortness of breath, facial droop, slurred speech, syncopal episodes, falls, or fevers since being discharged.  Reports that her appetite is normal.  She does not feel depressed after CVA  She is using a cane with ambulation  He feels happy with her progress and has no acute complaints   Review of Systems  Respiratory: Negative.   Cardiovascular: Negative.   Gastrointestinal: Negative.   Genitourinary: Negative.   Musculoskeletal: Positive for gait problem.  Skin:       Bruising    Neurological: Positive for weakness. Negative for dizziness, tremors, syncope, speech difficulty, light-headedness and headaches.  Hematological: Negative.   Psychiatric/Behavioral: Negative.   All other systems reviewed and are negative.  Past Medical History:  Diagnosis Date  . Atrial fibrillation ([place])   . Colon polyps   . Complication of anesthesia    difficulty awakening  . CVA (cerebral vascular accident) ([place]) 11/04/2017  . Diverticulosis   . Hypertension   . IATROGENIC CEREBROVASCULAR INFARCT/HEMORRHAGE NE 03/22/1993   Qualifier: Diagnosis of  By: Leanne Chang MD, [place] per pt's report-Dr Vertell Limber- hemorrhagic stroke. No intervention required    . Stroke [facility])     Social History   Socioeconomic History  . Marital status: Married    Spouse name: Not on file  . Number of children: Not on file  . Years of education: Not on file  . Highest  education level: Not on file  Occupational History  . Not on file  Social Needs  . Financial resource strain: Not on file  . Food insecurity:    Worry: Not on file    Inability: Not on file  . Transportation needs:    Medical: Not on file    Non-medical: Not on file  Tobacco Use  . Smoking status: Never Smoker  . Smokeless tobacco: Never Used  Substance and Sexual Activity  . Alcohol use: No  . Drug use: No  . Sexual activity: Not Currently  Lifestyle  . Physical activity:    Days per week: Not on file    Minutes per session: Not on file  . Stress: Not on file  Relationships  . Social connections:    Talks on phone: Not on file    Gets together: Not on file    Attends religious service: Not on file    Active member of club or organization: Not on file    Attends meetings of clubs or organizations: Not on file    Relationship status: Not on file  . Intimate partner violence:    Fear of current or ex partner: Not on file    Emotionally abused: Not on file    Physically abused: Not on file    Forced sexual activity: Not on file  Other Topics Concern  .  Not on file  Social History Narrative   Married 87 years    - Seven children 6 girls and one boy.    - three children live in [place] live in [place] in [place].       11 grand children and 7 great grandchildren      Worked at [employer] for four years and then started having children       Diet: Fast food and cooks at home      Likes to [employer], flowers and working in the yard, Firefighter.           Past Surgical History:  Procedure Laterality Date  . APPENDECTOMY    . EYE SURGERY Left    vitrectomy  . IR ANGIOGRAM EXTREMITY LEFT  11/05/2017  . IR ARCH CERVICICEBRAL NON-SEL (MS)  11/05/2017  . RADIOLOGY WITH ANESTHESIA N/A 11/05/2017   Procedure: RADIOLOGY WITH ANESTHESIA;  Surgeon: Luanne Bras, MD;  Location: [facility];  Service: Radiology;  Laterality: N/A;  . TOTAL HIP  ARTHROPLASTY Right 09/03/2014   Procedure: RIGHT TOTAL HIP ARTHROPLASTY ANTERIOR APPROACH;  Surgeon: Renette Butters, MD;  Location: [facility];  Service: Orthopedics;  Laterality: Right;  . TUBAL LIGATION      Family History  Problem Relation Age of Onset  . Diabetes Brother   . Heart disease Brother   . Hypertension Brother   . Stroke Mother   . Hypertension Mother   . Heart disease Father        CABG, MI  . Cancer Father        skin    Allergies  Allergen Reactions  . Ace Inhibitors Anaphylaxis    Angioedema, perioperative  . Celebrex [Celecoxib] Itching and Other (See Comments)    Twitching   . Antihistamines, Chlorpheniramine-Type Other (See Comments)    A-fib  . Aspirin Other (See Comments)    History of hemorrhagic stroke  . Cortisone Other (See Comments)    "cortisone shot made me jerk"  . Lidocaine Other (See Comments)    Pt was fine for ~2 days after she received medication and states she then got "sore all over and could not move"  . Phenytoin Swelling    Swelling mostly in arms  . Sulfonamide Derivatives Itching    Current Outpatient Medications on File Prior to Visit  Medication Sig Dispense Refill  . apixaban (ELIQUIS) 5 MG TABS tablet Take 1 tablet (5 mg total) by mouth 2 (two) times daily. 60 tablet 2  . atorvastatin (LIPITOR) 40 MG tablet Take 1 tablet (40 mg total) by mouth daily at 6 PM. 30 tablet 2  . carvedilol (COREG) 12.5 MG tablet Take 1 tablet (12.5 mg total) by mouth 2 (two) times daily. 180 tablet 3  . hydrochlorothiazide (HYDRODIURIL) 25 MG tablet Take 1 tablet (25 mg total) by mouth daily. 90 tablet 3  . vitamin B-12 (CYANOCOBALAMIN) 500 MCG tablet Take 500 mcg by mouth daily.     No current facility-administered medications on file prior to visit.     BP 124/82 (BP Location: Left Arm, Patient Position: Sitting, Cuff Size: Normal)   Pulse 72   Temp 97.7 F (36.5 C) (Oral)   Wt 125 lb 6 oz (56.9 kg)   SpO2 98%   BMI 19.06 kg/m         Objective:   Physical Exam  Constitutional: She is oriented to person, place, and time. She appears well-developed and well-nourished. No  distress.  Eyes: Pupils are equal, round, and reactive to light. Conjunctivae and EOM are normal.  Neck: Carotid bruit is not present. Thyroid mass present.  Cardiovascular: Normal rate, normal heart sounds and intact distal pulses. An irregularly irregular rhythm present.  Pulmonary/Chest: Effort normal and breath sounds normal.  Abdominal: Soft. Bowel sounds are normal.  Musculoskeletal: Normal range of motion. She exhibits no edema, tenderness or deformity.  Walks with single prong cane   Neurological: She is alert and oriented to person, place, and time. She has normal strength and normal reflexes. She displays no tremor. No cranial nerve deficit or sensory deficit. She exhibits normal muscle tone. She displays no seizure activity. Coordination and gait normal. GCS eye subscore is 4. GCS verbal subscore is 5. GCS motor subscore is 6.  5/5 strength bilateral upper and lower extremities.   Skin: Skin is warm and dry. She is not diaphoretic.  Bruising noted on bilateral arms   Psychiatric: She has a normal mood and affect. Her behavior is normal. Judgment and thought content normal.  Nursing note and vitals reviewed.     Assessment & Plan:  1. Cerebrovascular accident (CVA) due to thrombosis of precerebral artery ([place]) - She seems to be doing very well. Has no complaints today  - Continue with Eliquis. Two weeks of samples given.  - Follow up with Neurology as directed  - Reviewed hospital discharge notes, imaging, and labs with patient and her son. All questions answered - Follow up as needed - CBC with Differential/Platelet - Basic Metabolic Panel  2. Thyroid nodule - US thyroid biopsy ordered  3. Lung density on x-ray CT chest ordered  4. Permanent atrial fibrillation (HCC) - Continue with Eliquis  - CBC with Differential/Platelet - Basic  Metabolic Panel  5. Mixed hyperlipidemia - Continue with lipitor   6. Essential hypertension - well controlled. No change in medications  - CBC with Differential/Platelet - Basic Metabolic Panel   Dorothyann Peng, NP

## 2017-11-11 NOTE — Patient Instructions (Signed)
It was great seeing you today and I am glad you are doing well.   I will get you scheduled for the CT and biopsy   Please follow up with Neurology   Let me know if you need anything

## 2017-11-11 NOTE — Telephone Encounter (Signed)
Order entered. LMTCB for Tanya to advise.

## 2017-11-11 NOTE — Telephone Encounter (Signed)
Yes, both please.

## 2017-11-11 NOTE — Telephone Encounter (Signed)
Copied from [place] [phone]. Topic: General - Other >> Nov 11, 2017  2:26 PM Judyann Munson wrote: Reason for CRM:  Lavella Lemons from [facility] is calling to verify if the provider only wants RT Nodule BX'D or Both since the recommendation was for both. Lavella Lemons is requesting a call back to her direct number [phone], Pllease advise  Tommi Rumps - Per [place] bilateral nodules meet guidelines for bx. Please advise. Thanks!

## 2017-11-11 NOTE — Addendum Note (Signed)
Addended by: Apolinar Junes on: 11/11/2017 12:08 PM   Modules accepted: Level of Service

## 2017-11-14 ENCOUNTER — Encounter: Payer: Self-pay | Admitting: *Deleted

## 2017-11-14 ENCOUNTER — Other Ambulatory Visit: Payer: Self-pay | Admitting: Neurology

## 2017-11-15 ENCOUNTER — Telehealth: Payer: Self-pay

## 2017-11-15 NOTE — Telephone Encounter (Signed)
LEft vm for Tonya at GI for clearance of thyroid biopsy. Pt had stroke this month. LEft vm at [phone].

## 2017-11-15 NOTE — Telephone Encounter (Signed)
RN call Kenney Houseman at [facility]. She receive the clearance form.

## 2017-11-15 NOTE — Telephone Encounter (Signed)
Receive call from [facility] that pt had ultrasound already. They recommend biopsy in outpatient. Kenney Houseman stated PCP put order in for the procedure, but Dr. Leonie Man saw pt in hospital. Pt was started on eilquis by Dr .Leonie Man. RN stated pt was discharge two weeks,and if it was urgent or non urgent. Tonya from GI did not know. RN stated message will be sent to Dr. Leonie Man. Tonya verbalized understanding.

## 2017-11-15 NOTE — Telephone Encounter (Signed)
Clearance form fax to [place] at [phone]. RN call Kenney Houseman to state pt needs to hold eliquis 48 hours prior to thyroid biopsy with a small but acceptable peri-procedural risk of TIA/stroke resume when safe if pt willing. Kenney Houseman will let pt and MD know who is dong the procedure.

## 2017-11-15 NOTE — Telephone Encounter (Signed)
Clearance form fax twice to [phone], and receive.

## 2017-11-16 ENCOUNTER — Telehealth: Payer: Self-pay

## 2017-11-16 NOTE — Telephone Encounter (Signed)
We may need to hold off on the biopsy for now, I think Neurology is going to be handling Eliquis, so she will need to see them first. She has an appointment in September with them

## 2017-11-16 NOTE — Telephone Encounter (Signed)
Copied from [place] [phone]. Topic: Referral - Question >> Nov 16, 2017 12:01 PM Vernona Rieger wrote: Reason for CRM: Sara [facility] from [facility] needs a call back from [place] or his nurse in reference to her thyroid biopsy. She is on blood thinners and has many questions about this. Please call back @ [phone] that is her direct line. You can leave a voicemail with a call back number.

## 2017-11-17 NOTE — Telephone Encounter (Signed)
Left a message for Noel Journey to return my call.

## 2017-11-17 NOTE — Telephone Encounter (Signed)
Spoke to [place] and informed her that we will hold off on the bx until pt speaks with Dr. Leonie Man.  Pt notified that we will hold of on bx until she sees neuro.  She needs to discuss if it will be safe for her to come off the medication to do bx.  No further action required.

## 2017-11-22 ENCOUNTER — Other Ambulatory Visit: Payer: Self-pay

## 2017-11-22 NOTE — Patient Outreach (Signed)
[facility]) Care Management  11/22/2017  Kayla Wilson Dec 19, 1940 [id number]   EMMI- Stroke RED ON EMMI ALERT Day # 13 Date: 11/21/17 Red Alert Reason:  Martin Majestic to follow up appointment? No   Outreach attempt: Spoke with patient.  She is able to verify HIPAA.  Discussed red alert. Patient reports that the system just does not record her correctly.  Patient has already seen PCP and he feels she is doing well. Patient voices no concerns.    Plan: RN CM will follow up with patient again some time next week and patient agrees to next outreach.     Jone Baseman, RN, MSN [facility] Care Management Care Management Coordinator Direct Line [phone] Toll Free: [phone]  Fax: [phone]

## 2017-11-28 ENCOUNTER — Ambulatory Visit (INDEPENDENT_AMBULATORY_CARE_PROVIDER_SITE_OTHER)
Admission: RE | Admit: 2017-11-28 | Discharge: 2017-11-28 | Disposition: A | Discharge status: Home / Self Care | Payer: Medicare Other | Source: Ambulatory Visit | Attending: Adult Health | Admitting: Adult Health

## 2017-11-28 DIAGNOSIS — J984 Other disorders of lung: Secondary | ICD-10-CM

## 2017-11-28 DIAGNOSIS — R918 Other nonspecific abnormal finding of lung field: Secondary | ICD-10-CM | POA: Diagnosis not present

## 2017-11-29 ENCOUNTER — Other Ambulatory Visit: Payer: Self-pay

## 2017-11-29 NOTE — Patient Outreach (Signed)
[facility]) Care Management  11/29/2017  Kayla Wilson 26-Jan-1941 [id number]   Telephone call to patient to follow up on EMMI stroke.  Patient reports she is doing well except for decline with fall allergies.  She still reports some swelling to her leg by the end of the day but it is gone by morning.  Patient voices no questions or concerns.    Plan: RN CM will close case.    Jone Baseman, RN, MSN [facility] Care Management Coordinator Direct Line [phone] Cell [phone] Toll Free: [phone]  Fax: [phone]

## 2017-11-30 ENCOUNTER — Other Ambulatory Visit: Payer: Self-pay | Admitting: Adult Health

## 2017-11-30 DIAGNOSIS — J219 Acute bronchiolitis, unspecified: Secondary | ICD-10-CM

## 2017-12-03 ENCOUNTER — Inpatient Hospital Stay (HOSPITAL_COMMUNITY)
Admission: EM | Admit: 2017-12-03 | Discharge: 2017-12-05 | DRG: 253 | Disposition: A | Discharge status: Home Health | Payer: Medicare Other | Attending: Vascular Surgery | Admitting: Vascular Surgery

## 2017-12-03 ENCOUNTER — Encounter (HOSPITAL_COMMUNITY): Payer: Self-pay | Admitting: *Deleted

## 2017-12-03 ENCOUNTER — Emergency Department (HOSPITAL_COMMUNITY): Payer: Medicare Other

## 2017-12-03 ENCOUNTER — Encounter (HOSPITAL_COMMUNITY)
Admission: EM | Disposition: A | Discharge status: Home Health | Payer: Self-pay | Source: Home / Self Care | Attending: Vascular Surgery

## 2017-12-03 ENCOUNTER — Emergency Department (HOSPITAL_COMMUNITY): Payer: Medicare Other | Admitting: Anesthesiology

## 2017-12-03 ENCOUNTER — Other Ambulatory Visit: Payer: Self-pay

## 2017-12-03 DIAGNOSIS — Z86010 Personal history of colonic polyps: Secondary | ICD-10-CM

## 2017-12-03 DIAGNOSIS — Z888 Allergy status to other drugs, medicaments and biological substances status: Secondary | ICD-10-CM | POA: Diagnosis not present

## 2017-12-03 DIAGNOSIS — I959 Hypotension, unspecified: Secondary | ICD-10-CM | POA: Diagnosis not present

## 2017-12-03 DIAGNOSIS — I728 Aneurysm of other specified arteries: Secondary | ICD-10-CM | POA: Diagnosis not present

## 2017-12-03 DIAGNOSIS — Z823 Family history of stroke: Secondary | ICD-10-CM

## 2017-12-03 DIAGNOSIS — Z833 Family history of diabetes mellitus: Secondary | ICD-10-CM | POA: Diagnosis not present

## 2017-12-03 DIAGNOSIS — Z8249 Family history of ischemic heart disease and other diseases of the circulatory system: Secondary | ICD-10-CM

## 2017-12-03 DIAGNOSIS — K5790 Diverticulosis of intestine, part unspecified, without perforation or abscess without bleeding: Secondary | ICD-10-CM | POA: Diagnosis not present

## 2017-12-03 DIAGNOSIS — Z8679 Personal history of other diseases of the circulatory system: Secondary | ICD-10-CM

## 2017-12-03 DIAGNOSIS — E871 Hypo-osmolality and hyponatremia: Secondary | ICD-10-CM | POA: Diagnosis present

## 2017-12-03 DIAGNOSIS — Z882 Allergy status to sulfonamides status: Secondary | ICD-10-CM

## 2017-12-03 DIAGNOSIS — I10 Essential (primary) hypertension: Secondary | ICD-10-CM | POA: Diagnosis not present

## 2017-12-03 DIAGNOSIS — Z7901 Long term (current) use of anticoagulants: Secondary | ICD-10-CM | POA: Diagnosis not present

## 2017-12-03 DIAGNOSIS — I871 Compression of vein: Secondary | ICD-10-CM | POA: Diagnosis not present

## 2017-12-03 DIAGNOSIS — T81718A Complication of other artery following a procedure, not elsewhere classified, initial encounter: Secondary | ICD-10-CM

## 2017-12-03 DIAGNOSIS — Z884 Allergy status to anesthetic agent status: Secondary | ICD-10-CM

## 2017-12-03 DIAGNOSIS — I724 Aneurysm of artery of lower extremity: Secondary | ICD-10-CM | POA: Diagnosis not present

## 2017-12-03 DIAGNOSIS — M7989 Other specified soft tissue disorders: Secondary | ICD-10-CM

## 2017-12-03 DIAGNOSIS — Z809 Family history of malignant neoplasm, unspecified: Secondary | ICD-10-CM | POA: Diagnosis not present

## 2017-12-03 DIAGNOSIS — I5022 Chronic systolic (congestive) heart failure: Secondary | ICD-10-CM | POA: Diagnosis not present

## 2017-12-03 DIAGNOSIS — Z96641 Presence of right artificial hip joint: Secondary | ICD-10-CM | POA: Diagnosis not present

## 2017-12-03 DIAGNOSIS — I4891 Unspecified atrial fibrillation: Secondary | ICD-10-CM | POA: Diagnosis not present

## 2017-12-03 DIAGNOSIS — S61209D Unspecified open wound of unspecified finger without damage to nail, subsequent encounter: Secondary | ICD-10-CM

## 2017-12-03 DIAGNOSIS — I110 Hypertensive heart disease with heart failure: Secondary | ICD-10-CM | POA: Diagnosis not present

## 2017-12-03 DIAGNOSIS — Z9889 Other specified postprocedural states: Secondary | ICD-10-CM

## 2017-12-03 DIAGNOSIS — Z886 Allergy status to analgesic agent status: Secondary | ICD-10-CM | POA: Diagnosis not present

## 2017-12-03 DIAGNOSIS — Z8673 Personal history of transient ischemic attack (TIA), and cerebral infarction without residual deficits: Secondary | ICD-10-CM

## 2017-12-03 DIAGNOSIS — I639 Cerebral infarction, unspecified: Secondary | ICD-10-CM | POA: Diagnosis not present

## 2017-12-03 DIAGNOSIS — Z0389 Encounter for observation for other suspected diseases and conditions ruled out: Secondary | ICD-10-CM | POA: Diagnosis not present

## 2017-12-03 HISTORY — PX: FALSE ANEURYSM REPAIR: SHX5152

## 2017-12-03 LAB — URINALYSIS, ROUTINE W REFLEX MICROSCOPIC
BACTERIA UA: NONE SEEN
BILIRUBIN URINE: NEGATIVE
Glucose, UA: NEGATIVE mg/dL
Hgb urine dipstick: NEGATIVE
Ketones, ur: 5 mg/dL — AB
Nitrite: NEGATIVE
PROTEIN: NEGATIVE mg/dL
Specific Gravity, Urine: 1.011 (ref 1.005–1.030)
pH: 8 (ref 5.0–8.0)

## 2017-12-03 LAB — COMPREHENSIVE METABOLIC PANEL
ALBUMIN: 3.4 g/dL — AB (ref 3.5–5.0)
ALT: 10 U/L (ref 0–44)
ANION GAP: 11 (ref 5–15)
AST: 19 U/L (ref 15–41)
Alkaline Phosphatase: 56 U/L (ref 38–126)
BILIRUBIN TOTAL: 1.3 mg/dL — AB (ref 0.3–1.2)
BUN: <5 mg/dL — ABNORMAL LOW (ref 8–23)
CHLORIDE: 88 mmol/L — AB (ref 98–111)
CO2: 27 mmol/L (ref 22–32)
Calcium: 8.9 mg/dL (ref 8.9–10.3)
Creatinine, Ser: 0.63 mg/dL (ref 0.44–1.00)
GFR calc Af Amer: >60 mL/min (ref >60)
GFR calc non Af Amer: >60 mL/min (ref >60)
GLUCOSE: 133 mg/dL — AB (ref 70–99)
POTASSIUM: 3.1 mmol/L — AB (ref 3.5–5.1)
Sodium: 126 mmol/L — ABNORMAL LOW (ref 135–145)
TOTAL PROTEIN: 6.8 g/dL (ref 6.5–8.1)

## 2017-12-03 LAB — TYPE AND SCREEN
ABO/RH(D): A POS
ANTIBODY SCREEN: NEGATIVE

## 2017-12-03 LAB — CBC WITH DIFFERENTIAL/PLATELET
ABS IMMATURE GRANULOCYTES: 0 10*3/uL (ref 0.0–0.1)
BASOS ABS: 0 10*3/uL (ref 0.0–0.1)
Basophils Relative: 1 %
Eosinophils Absolute: 0.1 10*3/uL (ref 0.0–0.7)
Eosinophils Relative: 2 %
HEMATOCRIT: 39.9 % (ref 36.0–46.0)
HEMOGLOBIN: 13.3 g/dL (ref 12.0–15.0)
Immature Granulocytes: 1 %
LYMPHS PCT: 26 %
Lymphs Abs: 1.5 10*3/uL (ref 0.7–4.0)
MCH: 29 pg (ref 26.0–34.0)
MCHC: 33.3 g/dL (ref 30.0–36.0)
MCV: 86.9 fL (ref 78.0–100.0)
MONO ABS: 0.6 10*3/uL (ref 0.1–1.0)
Monocytes Relative: 10 %
NEUTROS ABS: 3.5 10*3/uL (ref 1.7–7.7)
Neutrophils Relative %: 60 %
Platelets: 227 10*3/uL (ref 150–400)
RBC: 4.59 MIL/uL (ref 3.87–5.11)
RDW: 12.4 % (ref 11.5–15.5)
WBC: 5.8 10*3/uL (ref 4.0–10.5)

## 2017-12-03 LAB — I-STAT CG4 LACTIC ACID, ED: LACTIC ACID, VENOUS: 0.97 mmol/L (ref 0.5–1.9)

## 2017-12-03 SURGERY — REPAIR, PSEUDOANEURYSM
Anesthesia: General | Laterality: Right

## 2017-12-03 MED ORDER — SODIUM CHLORIDE 0.9 % IV SOLN: 500.0000 mL | Freq: Once | INTRAVENOUS | Status: DC | PRN

## 2017-12-03 MED ORDER — CEFAZOLIN SODIUM-DEXTROSE 2-4 GM/100ML-% IV SOLN
INTRAVENOUS | Status: AC
  Filled 2017-12-03: qty 100

## 2017-12-03 MED ORDER — HYDROMORPHONE HCL 1 MG/ML IJ SOLN
0.2500 mg | INTRAMUSCULAR | Status: DC | PRN
  Administered 2017-12-03: 0.25 mg via INTRAVENOUS

## 2017-12-03 MED ORDER — PANTOPRAZOLE SODIUM 40 MG PO TBEC
40.0000 mg | DELAYED_RELEASE_TABLET | Freq: Every day | ORAL | Status: DC
  Administered 2017-12-04 – 2017-12-05 (×2): 40 mg via ORAL
  Filled 2017-12-03 (×2): qty 1

## 2017-12-03 MED ORDER — HYDROCHLOROTHIAZIDE 25 MG PO TABS
25.0000 mg | ORAL_TABLET | Freq: Every day | ORAL | Status: DC
  Administered 2017-12-03 – 2017-12-05 (×3): 25 mg via ORAL
  Filled 2017-12-03 (×3): qty 1

## 2017-12-03 MED ORDER — LIDOCAINE 2% (20 MG/ML) 5 ML SYRINGE
INTRAMUSCULAR | Status: DC | PRN
  Administered 2017-12-03: 80 mg via INTRAVENOUS

## 2017-12-03 MED ORDER — ONDANSETRON HCL 4 MG/2ML IJ SOLN
INTRAMUSCULAR | Status: DC | PRN
  Administered 2017-12-03: 4 mg via INTRAVENOUS

## 2017-12-03 MED ORDER — ATORVASTATIN CALCIUM 40 MG PO TABS
40.0000 mg | ORAL_TABLET | Freq: Every day | ORAL | Status: DC
  Administered 2017-12-04: 40 mg via ORAL
  Filled 2017-12-03 (×2): qty 1

## 2017-12-03 MED ORDER — 0.9 % SODIUM CHLORIDE (POUR BTL) OPTIME
TOPICAL | Status: DC | PRN
  Administered 2017-12-03: 2000 mL

## 2017-12-03 MED ORDER — PHENYLEPHRINE HCL 10 MG/ML IJ SOLN
INTRAMUSCULAR | Status: DC | PRN
  Administered 2017-12-03 (×4): 80 ug via INTRAVENOUS

## 2017-12-03 MED ORDER — MIDAZOLAM HCL 5 MG/5ML IJ SOLN
INTRAMUSCULAR | Status: DC | PRN
  Administered 2017-12-03 (×2): 1 mg via INTRAVENOUS

## 2017-12-03 MED ORDER — HYDRALAZINE HCL 20 MG/ML IJ SOLN: 5.0000 mg | INTRAMUSCULAR | Status: DC | PRN

## 2017-12-03 MED ORDER — METOPROLOL TARTRATE 5 MG/5ML IV SOLN: 2.0000 mg | INTRAVENOUS | Status: DC | PRN

## 2017-12-03 MED ORDER — ACETAMINOPHEN 325 MG PO TABS
325.0000 mg | ORAL_TABLET | ORAL | Status: DC | PRN
  Filled 2017-12-03: qty 2

## 2017-12-03 MED ORDER — GUAIFENESIN-DM 100-10 MG/5ML PO SYRP: 15.0000 mL | ORAL_SOLUTION | ORAL | Status: DC | PRN

## 2017-12-03 MED ORDER — PROPOFOL 10 MG/ML IV BOLUS
INTRAVENOUS | Status: DC | PRN
  Administered 2017-12-03: 80 mg via INTRAVENOUS

## 2017-12-03 MED ORDER — MIDAZOLAM HCL 2 MG/2ML IJ SOLN
INTRAMUSCULAR | Status: AC
  Filled 2017-12-03: qty 2

## 2017-12-03 MED ORDER — POTASSIUM CHLORIDE CRYS ER 20 MEQ PO TBCR
20.0000 meq | EXTENDED_RELEASE_TABLET | Freq: Once | ORAL | Status: AC | PRN
  Administered 2017-12-04: 20 meq via ORAL
  Filled 2017-12-03: qty 1

## 2017-12-03 MED ORDER — SODIUM CHLORIDE 0.9 % IV SOLN
INTRAVENOUS | Status: AC
  Filled 2017-12-03: qty 1.2

## 2017-12-03 MED ORDER — LACTATED RINGERS IV SOLN
INTRAVENOUS | Status: DC | PRN
  Administered 2017-12-03 (×2): via INTRAVENOUS

## 2017-12-03 MED ORDER — OXYCODONE HCL 5 MG PO TABS
5.0000 mg | ORAL_TABLET | ORAL | Status: DC | PRN
  Administered 2017-12-04 (×2): 5 mg via ORAL
  Filled 2017-12-03: qty 2
  Filled 2017-12-03: qty 1

## 2017-12-03 MED ORDER — FENTANYL CITRATE (PF) 250 MCG/5ML IJ SOLN
INTRAMUSCULAR | Status: AC
  Filled 2017-12-03: qty 5

## 2017-12-03 MED ORDER — MEPERIDINE HCL 50 MG/ML IJ SOLN: 6.2500 mg | INTRAMUSCULAR | Status: DC | PRN

## 2017-12-03 MED ORDER — CEFAZOLIN SODIUM-DEXTROSE 2-4 GM/100ML-% IV SOLN
2.0000 g | Freq: Three times a day (TID) | INTRAVENOUS | Status: AC
  Administered 2017-12-03 – 2017-12-04 (×2): 2 g via INTRAVENOUS
  Filled 2017-12-03 (×2): qty 100

## 2017-12-03 MED ORDER — DOCUSATE SODIUM 100 MG PO CAPS
100.0000 mg | ORAL_CAPSULE | Freq: Every day | ORAL | Status: DC
  Administered 2017-12-05: 100 mg via ORAL
  Filled 2017-12-03 (×3): qty 1

## 2017-12-03 MED ORDER — SODIUM CHLORIDE 0.9 % IV SOLN
INTRAVENOUS | Status: DC | PRN
  Administered 2017-12-03: 500 mL

## 2017-12-03 MED ORDER — FENTANYL CITRATE (PF) 100 MCG/2ML IJ SOLN: 50.0000 ug | Freq: Once | INTRAMUSCULAR | Status: DC

## 2017-12-03 MED ORDER — ALUM & MAG HYDROXIDE-SIMETH 200-200-20 MG/5ML PO SUSP: 15.0000 mL | ORAL | Status: DC | PRN

## 2017-12-03 MED ORDER — HYDROMORPHONE HCL 1 MG/ML IJ SOLN
INTRAMUSCULAR | Status: AC
  Filled 2017-12-03: qty 1

## 2017-12-03 MED ORDER — VITAMIN B-12 500 MCG PO TABS: 500.0000 ug | ORAL_TABLET | Freq: Every day | ORAL | Status: DC

## 2017-12-03 MED ORDER — PHENOL 1.4 % MT LIQD: 1.0000 | OROMUCOSAL | Status: DC | PRN

## 2017-12-03 MED ORDER — DEXTROSE-NACL 5-0.45 % IV SOLN
INTRAVENOUS | Status: DC
  Administered 2017-12-03: 21:00:00 via INTRAVENOUS

## 2017-12-03 MED ORDER — LABETALOL HCL 5 MG/ML IV SOLN: 10.0000 mg | INTRAVENOUS | Status: DC | PRN

## 2017-12-03 MED ORDER — PROPOFOL 10 MG/ML IV BOLUS
INTRAVENOUS | Status: AC
  Filled 2017-12-03: qty 20

## 2017-12-03 MED ORDER — POLYETHYLENE GLYCOL 3350 17 G PO PACK: 17.0000 g | PACK | Freq: Every day | ORAL | Status: DC | PRN

## 2017-12-03 MED ORDER — ONDANSETRON HCL 4 MG/2ML IJ SOLN: 4.0000 mg | Freq: Four times a day (QID) | INTRAMUSCULAR | Status: DC | PRN

## 2017-12-03 MED ORDER — ACETAMINOPHEN 325 MG RE SUPP: 325.0000 mg | RECTAL | Status: DC | PRN

## 2017-12-03 MED ORDER — SODIUM CHLORIDE 0.9 % IV SOLN
INTRAVENOUS | Status: DC | PRN
  Administered 2017-12-03: 40 ug/min via INTRAVENOUS

## 2017-12-03 MED ORDER — FENTANYL CITRATE (PF) 250 MCG/5ML IJ SOLN
INTRAMUSCULAR | Status: DC | PRN
  Administered 2017-12-03: 100 ug via INTRAVENOUS

## 2017-12-03 MED ORDER — ONDANSETRON HCL 4 MG/2ML IJ SOLN: 4.0000 mg | Freq: Once | INTRAMUSCULAR | Status: DC | PRN

## 2017-12-03 MED ORDER — ARTIFICIAL TEARS OPHTHALMIC OINT
TOPICAL_OINTMENT | OPHTHALMIC | Status: DC | PRN
  Administered 2017-12-03: 1 via OPHTHALMIC

## 2017-12-03 MED ORDER — CARVEDILOL 12.5 MG PO TABS
12.5000 mg | ORAL_TABLET | Freq: Two times a day (BID) | ORAL | Status: DC
  Administered 2017-12-03 – 2017-12-04 (×2): 12.5 mg via ORAL
  Filled 2017-12-03 (×2): qty 1

## 2017-12-03 MED ORDER — CEFAZOLIN SODIUM-DEXTROSE 2-4 GM/100ML-% IV SOLN
2.0000 g | Freq: Once | INTRAVENOUS | Status: AC
  Administered 2017-12-03: 2 g via INTRAVENOUS

## 2017-12-03 SURGICAL SUPPLY — 47 items
BANDAGE ESMARK 6X9 LF (GAUZE/BANDAGES/DRESSINGS) IMPLANT
BNDG ESMARK 6X9 LF (GAUZE/BANDAGES/DRESSINGS)
CANISTER SUCT 3000ML PPV (MISCELLANEOUS) ×3 IMPLANT
CANNULA VESSEL 3MM 2 BLNT TIP (CANNULA) IMPLANT
CLIP LIGATING EXTRA MED SLVR (CLIP) ×3 IMPLANT
CLIP LIGATING EXTRA SM BLUE (MISCELLANEOUS) ×3 IMPLANT
CUFF TOURNIQUET SINGLE 18IN (TOURNIQUET CUFF) IMPLANT
CUFF TOURNIQUET SINGLE 24IN (TOURNIQUET CUFF) IMPLANT
CUFF TOURNIQUET SINGLE 34IN LL (TOURNIQUET CUFF) IMPLANT
CUFF TOURNIQUET SINGLE 44IN (TOURNIQUET CUFF) IMPLANT
DERMABOND ADVANCED (GAUZE/BANDAGES/DRESSINGS) ×2
DERMABOND ADVANCED .7 DNX12 (GAUZE/BANDAGES/DRESSINGS) ×1 IMPLANT
DRAIN SNY 10X20 3/4 PERF (WOUND CARE) IMPLANT
DRAPE X-RAY CASS 24X20 (DRAPES) IMPLANT
ELECT REM PT RETURN 9FT ADLT (ELECTROSURGICAL) ×3
ELECTRODE REM PT RTRN 9FT ADLT (ELECTROSURGICAL) ×1 IMPLANT
EVACUATOR SILICONE 100CC (DRAIN) IMPLANT
GLOVE BIO SURGEON STRL SZ 6.5 (GLOVE) ×2 IMPLANT
GLOVE BIO SURGEONS STRL SZ 6.5 (GLOVE) ×1
GLOVE BIOGEL M 6.5 STRL (GLOVE) ×3 IMPLANT
GLOVE BIOGEL PI IND STRL 7.0 (GLOVE) ×3 IMPLANT
GLOVE BIOGEL PI INDICATOR 7.0 (GLOVE) ×6
GLOVE SS BIOGEL STRL SZ 7.5 (GLOVE) ×1 IMPLANT
GLOVE SUPERSENSE BIOGEL SZ 7.5 (GLOVE) ×2
GOWN STRL REUS W/ TWL LRG LVL3 (GOWN DISPOSABLE) ×3 IMPLANT
GOWN STRL REUS W/TWL LRG LVL3 (GOWN DISPOSABLE) ×6
KIT BASIN OR (CUSTOM PROCEDURE TRAY) ×3 IMPLANT
KIT TURNOVER KIT B (KITS) ×3 IMPLANT
NS IRRIG 1000ML POUR BTL (IV SOLUTION) ×6 IMPLANT
PACK PERIPHERAL VASCULAR (CUSTOM PROCEDURE TRAY) ×3 IMPLANT
PAD ARMBOARD 7.5X6 YLW CONV (MISCELLANEOUS) ×6 IMPLANT
PADDING CAST COTTON 6X4 STRL (CAST SUPPLIES) IMPLANT
SET COLLECT BLD 21X3/4 12 (NEEDLE) IMPLANT
SPONGE INTESTINAL PEANUT (DISPOSABLE) ×3 IMPLANT
STAPLER VISISTAT 35W (STAPLE) IMPLANT
STOPCOCK 4 WAY LG BORE MALE ST (IV SETS) IMPLANT
SUT ETHILON 3 0 PS 1 (SUTURE) IMPLANT
SUT PROLENE 5 0 C 1 24 (SUTURE) ×9 IMPLANT
SUT PROLENE 6 0 CC (SUTURE) ×3 IMPLANT
SUT VIC AB 2-0 CTX 36 (SUTURE) ×3 IMPLANT
SUT VIC AB 3-0 SH 27 (SUTURE) ×2
SUT VIC AB 3-0 SH 27X BRD (SUTURE) ×1 IMPLANT
TOWEL GREEN STERILE (TOWEL DISPOSABLE) ×3 IMPLANT
TRAY FOLEY MTR SLVR 16FR STAT (SET/KITS/TRAYS/PACK) IMPLANT
TUBING EXTENTION W/L.L. (IV SETS) IMPLANT
UNDERPAD 30X30 (UNDERPADS AND DIAPERS) ×3 IMPLANT
WATER STERILE IRR 1000ML POUR (IV SOLUTION) ×3 IMPLANT

## 2017-12-03 NOTE — Op Note (Signed)
    OPERATIVE REPORT  DATE OF SURGERY: 12/03/2017  PATIENT: Kayla Wilson, 77 y.o. female MRN: [id number]  DOB: 10/17/1940  PRE-OPERATIVE DIAGNOSIS: Right femoral false aneurysm  POST-OPERATIVE DIAGNOSIS:  Same  PROCEDURE: Repair of right femoral false aneurysm  SURGEON:  Curt Jews, M.D.  PHYSICIAN ASSISTANT: Luanne Bras, RNFA  ANESTHESIA: General  EBL: per anesthesia record  Total I/O In: 1000 [I.V.:1000] Out: -   BLOOD ADMINISTERED: none  DRAINS: none  SPECIMEN: none  COUNTS CORRECT:  YES  PATIENT DISPOSITION:  PACU - hemodynamically stable  PROCEDURE DETAILS: Patient was taken to the operative placed supine position where the area of the right groin were prepped and draped in usual sterile fashion.  Incision was made longitudinally over the femoral artery.  SonoSite ultrasound confirmed the large false aneurysm that was seen on duplex ultrasound.  Dissection was continued to the subcutaneous fat.  The large false aneurysm space was entered and hemostasis was controlled initially with direct finger finger pressure over the hole in the common femoral artery.  Pressure was held proximal and distal to this and the hole in the common femoral artery was closed with a figure-of-eight 5-0 Prolene suture.  This gave adequate hemostasis.  The patient did have a very large false aneurysm that extended medially and did compress the right common femoral vein.  By palpation there did not appear to be any clot in the common femoral vein.  The false aneurysm space also continued on the anterior abdominal wall anterior to the inguinal ligament and anterior rectus sheath.  Thrombus which is chronic was removed.  The space was irrigated and was closed with 2-0 Vicryl in several layers to obliterate the dead space.  The skin was closed with 3-0 subcuticular Vicryl suture.  Dermabond was applied and the patient was transferred to the recovery room in stable condition with palpable popliteal  pulse   Rosetta Posner, M.D., [facility] 12/03/2017 4:44 PM

## 2017-12-03 NOTE — ED Triage Notes (Signed)
Pt in c/o right leg pain and swelling, had a cardiac catheterization two weeks ago that entered her right groin, pain and swelling increased today

## 2017-12-03 NOTE — Progress Notes (Addendum)
VASCULAR LAB PRELIMINARY  PRELIMINARY  PRELIMINARY  PRELIMINARY  Right lower extremity venous duplex completed.    Preliminary report:  There is possible DVT in the right common femoral vein.  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  Gave report to Margarita Mail, PA-C  Towana Stenglein, RVT 12/03/2017, 2:06 PM

## 2017-12-03 NOTE — Anesthesia Preprocedure Evaluation (Signed)
Anesthesia Evaluation  Patient identified by MRN, date of birth, ID band Patient awake    Reviewed: Allergy & Precautions, NPO status , Patient's Chart, lab work & pertinent test results  Airway Mallampati: I  TM Distance: >3 FB Neck ROM: Full    Dental   Pulmonary    Pulmonary exam normal        Cardiovascular hypertension, Pt. on medications Normal cardiovascular exam+ dysrhythmias Atrial Fibrillation      Neuro/Psych CVA    GI/Hepatic   Endo/Other    Renal/GU      Musculoskeletal   Abdominal   Peds  Hematology   Anesthesia Other Findings   Reproductive/Obstetrics                             Anesthesia Physical Anesthesia Plan  ASA: III  Anesthesia Plan: General   Post-op Pain Management:    Induction: Intravenous  PONV Risk Score and Plan: 3 and Ondansetron, Midazolam and Treatment may vary due to age or medical condition  Airway Management Planned: Oral ETT  Additional Equipment:   Intra-op Plan:   Post-operative Plan: Extubation in OR  Informed Consent: I have reviewed the patients History and Physical, chart, labs and discussed the procedure including the risks, benefits and alternatives for the proposed anesthesia with the patient or authorized representative who has indicated his/her understanding and acceptance.     Plan Discussed with: CRNA and Surgeon  Anesthesia Plan Comments: (H/O anaphylactic reaction with angioedema prob. secondary to ACE inhibitors while under anesthesia in 2016. Had a GA in 10/2017 without problems.)        Anesthesia Quick Evaluation

## 2017-12-03 NOTE — Anesthesia Procedure Notes (Signed)
Procedure Name: Intubation Date/Time: 12/03/2017 3:54 PM Performed by: Josephine Igo, CRNA Pre-anesthesia Checklist: Patient identified, Suction available and Patient being monitored Patient Re-evaluated:Patient Re-evaluated prior to induction Oxygen Delivery Method: Circle system utilized Preoxygenation: Pre-oxygenation with 100% oxygen Induction Type: IV induction and Cricoid Pressure applied Ventilation: Mask ventilation without difficulty Laryngoscope Size: Miller and 2 Grade View: Grade I Tube type: Oral Tube size: 7.0 mm Number of attempts: 1 Airway Equipment and Method: Stylet Placement Confirmation: ETT inserted through vocal cords under direct vision,  positive ETCO2 and breath sounds checked- equal and bilateral Secured at: 22 cm Tube secured with: Tape Dental Injury: Teeth and Oropharynx as per pre-operative assessment

## 2017-12-03 NOTE — ED Provider Notes (Signed)
[facility] EMERGENCY DEPARTMENT Provider Note   CSN: [id number] Arrival date & time: 12/03/17  1000     History   Chief Complaint Chief Complaint  Patient presents with  . Leg Pain    HPI Kayla Wilson is a 77 y.o. female who presents the emergency department chief complaint of right leg swelling.  1 month ago the patient had a right common femoral catheterization by interventional neurology for cerebellar stroke.  She states that she is currently taking Eliquis.  She said that she has had pain in the leg since she had the procedure however it has become markedly edematous and swollen over the past week to the point where she is unable to lift the leg without assisting herself using the arms.  She denies chest pain or shortness of breath.  Her heart rate is listed as 122 however on the monitor she appears to have a heart rate of about 82.  The patient states that she is compliant with her blood thinning medication.  She denies a history of pulmonary embolus or DVT.  She has noticed significant swelling in the right groin.  She denies urinary symptoms, flank pain, feelings of presyncope or dizziness.  She is having significant difficulty ambulating.    HPI  Past Medical History:  Diagnosis Date  . Atrial fibrillation ([place])   . Colon polyps   . Complication of anesthesia    difficulty awakening  . CVA (cerebral vascular accident) ([place]) 11/04/2017  . Diverticulosis   . Hypertension   . 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 No intervention required    . Stroke [facility])     Patient Active Problem List   Diagnosis Date Noted  . Avulsion, finger tip, subsequent encounter 12/03/2017  . S/P aneurysm repair 12/03/2017  . Hyperlipidemia 11/07/2017  . Hx of hemorrhagic stroke 11/07/2017  . UTI (urinary tract infection) 11/07/2017  . Hypokalemia  11/07/2017  . Thyroid nodule 11/07/2017  . Lung density on x-ray 11/07/2017  . 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 CVA (cerebral vascular accident) ([place]) - small embolic infarcts d/t AF 01/60/1093  . 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 DJD (degenerative joint disease) 09/03/2014  . Angioedema   . Urticaria   . Allergic reaction to serum, anaphylactic shock   . Chronic systolic dysfunction of left ventricle 11/14/2013  . Atrial fibrillation ([place]) 09/26/2013  . UNSPECIFIED VENOUS INSUFFICIENCY 11/21/2008  . PSORIASIS 02/16/2008  . 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 DIVERTICULOSIS, COLON 01/12/2007  . COLONIC POLYPS, HX OF 01/12/2007  . Essential hypertension 11/15/2006    Past Surgical History:  Procedure Laterality Date  . APPENDECTOMY    . EYE SURGERY Left    vitrectomy  . FALSE ANEURYSM REPAIR Right 12/03/2017   Procedure: REPAIR OF RIGHT FEMORAL FALSE ANEURYSM;  Surgeon: Rosetta Posner, MD;  Location: [facility];  Service: Vascular;  Laterality: Right;  . IR ANGIOGRAM EXTREMITY LEFT  11/05/2017  . IR ARCH CERVICICEBRAL NON-SEL (MS)  11/05/2017  . RADIOLOGY WITH ANESTHESIA N/A 11/05/2017   Procedure: RADIOLOGY WITH ANESTHESIA;  Surgeon: Luanne Bras, MD;  Location: [place];  Service: Radiology;  Laterality: N/A;  . TOTAL HIP ARTHROPLASTY Right 09/03/2014   Procedure: RIGHT TOTAL HIP ARTHROPLASTY ANTERIOR APPROACH;  Surgeon: Renette Butters, MD;  Location: [facility];  Service: Orthopedics;  Laterality: Right;  . TUBAL  LIGATION       OB History   None      Home Medications    Prior to Admission medications   Medication Sig Start Date End Date Taking? Authorizing Provider  apixaban (ELIQUIS) 5 MG TABS tablet Take 1 tablet (5 mg total) by mouth 2 (two) times daily. 11/07/17  Yes Donzetta Starch, NP  atorvastatin (LIPITOR) 40 MG tablet Take 1 tablet (40 mg total) by mouth daily at 6 PM. 11/07/17  Yes Biby, Massie Kluver, NP  carvedilol (COREG) 12.5 MG  tablet Take 1 tablet (12.5 mg total) by mouth 2 (two) times daily. 02/16/17  Yes Nafziger, Tommi Rumps, NP  hydrochlorothiazide (HYDRODIURIL) 25 MG tablet Take 1 tablet (25 mg total) by mouth daily. 02/16/17  Yes Nafziger, Tommi Rumps, NP  vitamin B-12 (CYANOCOBALAMIN) 500 MCG tablet Take 500 mcg by mouth daily.   Yes [provider]    Family History Family History  Problem Relation Age of Onset  . Diabetes Brother   . Heart disease Brother   . Hypertension Brother   . Stroke Mother   . Hypertension Mother   . Heart disease Father        CABG, MI  . Cancer Father        skin    Social History Social History   Tobacco Use  . Smoking status: Never Smoker  . Smokeless tobacco: Never Used  Substance Use Topics  . Alcohol use: No  . Drug use: No     Allergies   Ace inhibitors; Celebrex [celecoxib]; Antihistamines, chlorpheniramine-type; Aspirin; Cortisone; Lidocaine; Phenytoin; and Sulfonamide derivatives   Review of Systems Review of Systems  Ten systems reviewed and are negative for acute change, except as noted in the HPI.   Physical Exam Updated Vital Signs BP 119/83 (BP Location: Left Arm)   Pulse 84   Temp 98.8 F (37.1 C) (Oral)   Resp 12   SpO2 97%   Physical Exam  Constitutional: She is oriented to person, place, and time. She appears well-developed and well-nourished. No distress.  HENT:  Head: Normocephalic and atraumatic.  Eyes: Conjunctivae are normal. No scleral icterus.  Neck: Normal range of motion.  Cardiovascular: Normal rate, regular rhythm and normal heart sounds. Exam reveals no gallop and no friction rub.  No murmur heard. Pulmonary/Chest: Effort normal and breath sounds normal. No respiratory distress.  Abdominal: Soft. Bowel sounds are normal. She exhibits no distension and no mass. There is no tenderness. There is no guarding.  Musculoskeletal:  Right lower extremity with 3+ pitting edema to the right knee.  There appears to be erythema and  minimal petechial hemorrhages.  Erythema appears consistent with venous stasis dermatitis.  Palpable DP and popliteal pulse.   Firm palpable nontender mass in the right groin just above the inguinal ligament.  Neurological: She is alert and oriented to person, place, and time.  Skin: Skin is warm and dry. She is not diaphoretic.  Psychiatric: Her behavior is normal.  Nursing note and vitals reviewed.    ED Treatments / Results  Labs (all labs ordered are listed, but only abnormal results are displayed) Labs Reviewed  COMPREHENSIVE METABOLIC PANEL - Abnormal; Notable for the following components:      Result Value   Sodium 126 (*)    Potassium 3.1 (*)    Chloride 88 (*)    Glucose, Bld 133 (*)    BUN <5 (*)    Albumin 3.4 (*)    Total Bilirubin 1.3 (*)  All other components within normal limits  URINALYSIS, ROUTINE W REFLEX MICROSCOPIC - Abnormal; Notable for the following components:   APPearance CLOUDY (*)    Ketones, ur 5 (*)    Leukocytes, UA TRACE (*)    All other components within normal limits  CBC - Abnormal; Notable for the following components:   RBC 3.66 (*)    Hemoglobin 10.7 (*)    HCT 31.9 (*)    All other components within normal limits  BASIC METABOLIC PANEL - Abnormal; Notable for the following components:   Sodium 128 (*)    Potassium 3.2 (*)    Chloride 91 (*)    Glucose, Bld 134 (*)    BUN <5 (*)    Calcium 7.9 (*)    All other components within normal limits  CBC WITH DIFFERENTIAL/PLATELET  I-STAT CG4 LACTIC ACID, ED  I-STAT CG4 LACTIC ACID, ED  TYPE AND SCREEN    EKG None  Radiology Dg Chest 2 View  Result Date: 12/03/2017 CLINICAL DATA:  Resolving infection on CT EXAM: CHEST - 2 VIEW COMPARISON:  CT chest dated 11/28/2017 FINDINGS: Lungs are essentially clear.  No pleural effusion or pneumothorax. The heart is top-normal in size. Degenerative changes the lower thoracic spine. IMPRESSION: No evidence of acute cardiopulmonary disease.  Electronically Signed   By: Julian Hy M.D.   On: 12/03/2017 11:01    Procedures .Critical Care Performed by: Margarita Mail, PA-C Authorized by: Margarita Mail, PA-C   Critical care provider statement:    Critical care time (minutes):  50   Critical care was necessary to treat or prevent imminent or life-threatening deterioration of the following conditions: Expanding Pseudoanurysm.   Critical care was time spent personally by me on the following activities:  Discussions with consultants, development of treatment plan with patient or surrogate, examination of patient, evaluation of patient's response to treatment, ordering and performing treatments and interventions, ordering and review of laboratory studies, ordering and review of radiographic studies, pulse oximetry, re-evaluation of patient's condition and review of old charts   (including critical care time)  Medications Ordered in ED Medications  fentaNYL (SUBLIMAZE) injection 50 mcg ( Intravenous MAR Unhold 12/03/17 1829)  ceFAZolin (ANCEF) 2-4 GM/100ML-% IVPB (has no administration in time range)  ceFAZolin (ANCEF) 2-4 GM/100ML-% IVPB (has no administration in time range)  hydrochlorothiazide (HYDRODIURIL) tablet 25 mg (25 mg Oral Given 12/04/17 0821)  atorvastatin (LIPITOR) tablet 40 mg (40 mg Oral Given 12/04/17 1825)  0.9 %  sodium chloride infusion (has no administration in time range)  acetaminophen (TYLENOL) tablet 325-650 mg (has no administration in time range)    Or  acetaminophen (TYLENOL) suppository 325-650 mg (has no administration in time range)  docusate sodium (COLACE) capsule 100 mg (100 mg Oral Not Given 12/04/17 0820)  ondansetron (ZOFRAN) injection 4 mg (has no administration in time range)  alum & mag hydroxide-simeth (MAALOX/MYLANTA) 200-200-20 MG/5ML suspension 15-30 mL (has no administration in time range)  pantoprazole (PROTONIX) EC tablet 40 mg (40 mg Oral Given 12/04/17 0817)  labetalol  (NORMODYNE,TRANDATE) injection 10 mg (has no administration in time range)  hydrALAZINE (APRESOLINE) injection 5 mg (has no administration in time range)  metoprolol tartrate (LOPRESSOR) injection 2-5 mg (has no administration in time range)  guaiFENesin-dextromethorphan (ROBITUSSIN DM) 100-10 MG/5ML syrup 15 mL (has no administration in time range)  phenol (CHLORASEPTIC) mouth spray 1 spray (has no administration in time range)  dextrose 5 %-0.45 % sodium chloride infusion ( Intravenous Stopped 12/04/17 0816)  oxyCODONE (Oxy IR/ROXICODONE) immediate release tablet 5-10 mg (5 mg Oral Given 12/04/17 1826)  polyethylene glycol (MIRALAX / GLYCOLAX) packet 17 g (has no administration in time range)  HYDROmorphone (DILAUDID) 1 MG/ML injection (has no administration in time range)  ceFAZolin (ANCEF) IVPB 2g/100 mL premix (2 g Intravenous Given 12/03/17 1556)  potassium chloride SA (K-DUR,KLOR-CON) CR tablet 20-40 mEq (20 mEq Oral Given 12/04/17 0818)  ceFAZolin (ANCEF) IVPB 2g/100 mL premix (2 g Intravenous New Bag/Given 12/04/17 0816)     Initial Impression / Assessment and Plan / ED Course  I have reviewed the triage vital signs and the nursing notes.  Pertinent labs & imaging results that were available during my care of the patient were reviewed by me and considered in my medical decision making (see chart for details).  Clinical Course as of Dec 05 613  Sat Dec 03, 2017  1437 Spoke with Dr. 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 She will need emergent surgical intervention and has been NPO since last night.   [AH]  1504 Sodium(!): 126 [AH]    Clinical Course User Index [AH] Margarita Mail, PA-C    Patient with Very large pseudotumor after ARterial Cath in the CFA 1 month ago. I have updated the paiten ton her diagnosis and need for surgical intervention. Dr. Donnetta Hutching has seen the patient and  she will go to the OR for intervention. Lans reviewd and shows hyponatremia in the setting of mild hyperglycemia. Labs otherwise without sig abnormality. I have reviewed the PA/Lateral CXR and the Arterial/ venous Duplex studies which shows no acute abnormalities on the CXR and (as described above) on the ultrasound. I agree  with the radiologists interpretation.   Final Clinical Impressions(s) / ED Diagnoses   Final diagnoses:  Pseudoaneurysm of femoral artery following procedure [facility])    ED Discharge Orders    None       Margarita Mail, PA-C 12/05/17 5465    Malvin Johns, MD 12/09/17 1504

## 2017-12-03 NOTE — H&P (Signed)
[facility]in [facility]ist of [place]  Patient name: Kayla Wilson MRN: [id number] DOB: Oct 24, 1940 Sex: female    HPI: Kayla Wilson is a 77 y.o. female presenting to the emergency room with swelling of her right leg.  She had presented with acute stroke to [facility] 1 month ago.  She underwent cerebral arteriogram via her right groin.  She eventually was discharged home.  She reports that she has noted discomfort and swelling in her right leg since the procedure.  This is become markedly progressed over the last several days and was very severe yesterday.  She presented to the emergency room today for further evaluation.  She also has noted increased swelling and pain in her right groin.  No history of prior peripheral vascular occlusive disease.  Past Medical History:  Diagnosis Date  . Atrial fibrillation ([place])   . Colon polyps   . Complication of anesthesia    difficulty awakening  . CVA (cerebral vascular accident) ([place]) 11/04/2017  . Diverticulosis   . Hypertension   . IATROGENIC CEREBROVASCULAR INFARCT/HEMORRHAGE NE 03/22/1993   Qualifier: Diagnosis of  By: Leanne Chang MD, [place] per pt's report-Dr Vertell Limber- hemorrhagic stroke. No intervention required    . Stroke [facility])     Family History  Problem Relation Age of Onset  . Diabetes Brother   . Heart disease Brother   . Hypertension Brother   . Stroke Mother   . Hypertension Mother   . Heart disease Father        CABG, MI  . Cancer Father        skin    SOCIAL HISTORY: Social History   Tobacco Use  . Smoking status: Never Smoker  . Smokeless tobacco: Never Used  Substance Use Topics  . Alcohol use: No    Allergies  Allergen Reactions  . Ace Inhibitors Anaphylaxis    Angioedema, perioperative  . Celebrex [Celecoxib] Itching and Other (See Comments)    Twitching   . Antihistamines, Chlorpheniramine-Type Other (See Comments)    A-fib  . Aspirin Other (See Comments)   History of hemorrhagic stroke  . Cortisone Other (See Comments)    "cortisone shot made me jerk"  . Lidocaine Other (See Comments)    Pt was fine for ~2 days after she received medication and states she then got "sore all over and could not move"  . Phenytoin Swelling    Swelling mostly in arms  . Sulfonamide Derivatives Itching    Current Facility-Administered Medications  Medication Dose Route Frequency Provider Last Rate Last Dose  . ceFAZolin (ANCEF) IVPB 2g/100 mL premix  2 g Intravenous Once Myisha Pickerel, [name], MD      . Doug Sou Hold] fentaNYL (SUBLIMAZE) injection 50 mcg  50 mcg Intravenous Once Malvin Johns, MD        REVIEW OF SYSTEMS:  [X]  denotes positive finding, [ ]  denotes negative finding Cardiac  Comments:  Chest pain or chest pressure:    Shortness of breath upon exertion:    Short of breath when lying flat:    Irregular heart rhythm:        Vascular    Pain in calf, thigh, or hip brought on by ambulation:    Pain in feet at night that wakes you up from your sleep:     Blood clot in your veins:    Leg swelling:  x         PHYSICAL EXAM: Vitals:   12/03/17 1230 12/03/17  1245 12/03/17 1300 12/03/17 1512  BP: (!) 171/98  (!) 152/95 (!) 143/90  Pulse:  95  (!) 55  Resp:      Temp:    (!) 97.5 F (36.4 C)  TempSrc:    Oral  SpO2:  100%  96%    GENERAL: The patient is a well-nourished female, in no acute distress. The vital signs are documented above. CARDIOVASCULAR: 2+ radial and 2+ posterior tibial pulses bilaterally marked swelling in her right leg versus her left.  She does have swelling in her right groin with expansile nature suggesting false aneurysm PULMONARY: There is good air exchange  MUSCULOSKELETAL: There are no major deformities or cyanosis. NEUROLOGIC: No focal weakness or paresthesias are detected. SKIN: There are no ulcers or rashes noted. PSYCHIATRIC: The patient has a normal affect.  DATA:  Duplex shows 5 cm false aneurysm arising from her  common femoral artery.  This also shows compression of her common femoral vein.  Impossible to state for sure that she does not have DVT by duplex  MEDICAL ISSUES: Large expanding false aneurysm causing probable common femoral vein compression and marked leg swelling.  Have recommended operative exploration and repair of false aneurysm, evacuation of hematoma.  Patient is on Eliquis but will proceed on an emergent basis.  Did discuss the procedure in detail with the patient and her family present including potential groin healing complications.    Rosetta Posner, MD FACS [facility] Tel [phone] Pager [phone]

## 2017-12-03 NOTE — Progress Notes (Addendum)
VASCULAR LAB PRELIMINARY  PRELIMINARY  PRELIMINARY  PRELIMINARY  Right ultrasound of groin completed.    Preliminary report:  There is a pseudoaneurysm measuring 4.55cm X 3.85cm with a neck 0.63cm.  Gave report to Margarita Mail, PA-C  Deshante Cassell, RVT 12/03/2017, 1:54 PM

## 2017-12-03 NOTE — Transfer of Care (Signed)
Immediate Anesthesia Transfer of Care Note  Patient: Kayla Wilson  Procedure(s) Performed: REPAIR OF RIGHT FEMORAL FALSE ANEURYSM (Right )  Patient Location: PACU  Anesthesia Type:General  Level of Consciousness: sedated  Airway & Oxygen Therapy: Patient Spontanous Breathing and Patient connected to nasal cannula oxygen  Post-op Assessment: Report given to RN and Post -op Vital signs reviewed and stable  Post vital signs: stable  Last Vitals:  Vitals Value Taken Time  BP 116/81 12/03/2017  4:54 PM  Temp    Pulse 80 12/03/2017  4:56 PM  Resp 20 12/03/2017  4:56 PM  SpO2 100 % 12/03/2017  4:56 PM  Vitals shown include unvalidated device data.  Last Pain:  Vitals:   12/03/17 1512  TempSrc: Oral  PainSc:          Complications: No apparent anesthesia complications

## 2017-12-04 ENCOUNTER — Encounter (HOSPITAL_COMMUNITY): Payer: Self-pay | Admitting: Vascular Surgery

## 2017-12-04 ENCOUNTER — Other Ambulatory Visit: Payer: Self-pay

## 2017-12-04 LAB — BASIC METABOLIC PANEL
ANION GAP: 10 (ref 5–15)
BUN: <5 mg/dL — ABNORMAL LOW (ref 8–23)
CALCIUM: 7.9 mg/dL — AB (ref 8.9–10.3)
CO2: 27 mmol/L (ref 22–32)
Chloride: 91 mmol/L — ABNORMAL LOW (ref 98–111)
Creatinine, Ser: 0.55 mg/dL (ref 0.44–1.00)
GLUCOSE: 134 mg/dL — AB (ref 70–99)
Potassium: 3.2 mmol/L — ABNORMAL LOW (ref 3.5–5.1)
Sodium: 128 mmol/L — ABNORMAL LOW (ref 135–145)

## 2017-12-04 LAB — CBC
HEMATOCRIT: 31.9 % — AB (ref 36.0–46.0)
Hemoglobin: 10.7 g/dL — ABNORMAL LOW (ref 12.0–15.0)
MCH: 29.2 pg (ref 26.0–34.0)
MCHC: 33.5 g/dL (ref 30.0–36.0)
MCV: 87.2 fL (ref 78.0–100.0)
Platelets: 197 10*3/uL (ref 150–400)
RBC: 3.66 MIL/uL — AB (ref 3.87–5.11)
RDW: 12.6 % (ref 11.5–15.5)
WBC: 7.6 10*3/uL (ref 4.0–10.5)

## 2017-12-04 NOTE — Evaluation (Signed)
Occupational Therapy Evaluation and Discharge Patient Details Name: Kayla Wilson MRN: [id number] DOB: 07/15/1940 Today's Date: 12/04/2017    History of Present Illness 76 y.o. female admitted on 11/04/17 for R facial droop and left arm weakness.  MRI revealed small areas of acute infarct (presumed cardioembolic in the L superior cerebellum and left superior insular cortex.  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  Pt with significant PMH of hemorrhagic stroke, HTN, A-fib, and R THA.   Clinical Impression   This 77 yo female admitted and underwent above presents to acute OT with decreased ROM RLE, increased pain, and decreased mobility all affecting her safety and independence with basic ADLs. Pt has family that can A prn until she is independent again with transfers and LBADLs. No further OT needs we will sign off  BP: supine 94/63        Sitting 91/69 (pt reported mild dizziness upon sitting up, but says her head is very stopped up as well and feels                               this may be contributing to dizziness)        Bed>recliner: 91/53 (no reports of dizziness)    Follow Up Recommendations  No OT follow up;Supervision/Assistance - 24 hour    Equipment Recommendations    None   Recommendations for Other Services  None     Precautions / Restrictions Precautions Precautions: Fall Precaution Comments: watch BP Restrictions Weight Bearing Restrictions: No      Mobility Bed Mobility Overal bed mobility: Modified Independent             General bed mobility comments: increased time, no rail and HOB flat coming out on right side  Transfers Overall transfer level: Needs assistance Equipment used: Rolling walker (2 wheeled) Transfers: Sit to/from [facility] Sit to Stand: Min guard Stand pivot transfers: Min guard       General  transfer comment: VCs for safe hand placement        ADL either performed or assessed with clinical judgement   ADL Overall ADL's : Needs assistance/impaired Eating/Feeding: Independent;Sitting   Grooming: Set up;Sitting   Upper Body Bathing: Set up;Sitting   Lower Body Bathing: Minimal assistance Lower Body Bathing Details (indicate cue type and reason): min guard A sit<>stand Upper Body Dressing : Set up;Sitting   Lower Body Dressing: Moderate assistance Lower Body Dressing Details (indicate cue type and reason): min guard A sit<>stand Toilet Transfer: Min Insurance claims handler Details (indicate cue type and reason): bed>recliner Toileting- Clothing Manipulation and Hygiene: Min guard;Sit to/from stand     Tub/Shower Transfer Details (indicate cue type and reason): Made her and family aware that they have to have clearance from surgeon as to when she can get incisions wet from showering standpoint         Vision Patient Visual Report: No change from baseline              Pertinent Vitals/Pain Pain Assessment: 0-10 Pain Score: (.5) Pain Location: RLE Pain Descriptors / Indicators: Sore Pain Intervention(s): Monitored during session;Repositioned     Hand Dominance Left   Extremity/Trunk Assessment Upper Extremity Assessment Upper Extremity Assessment: Overall WFL for tasks assessed           Communication Communication Communication:  No difficulties   Cognition Arousal/Alertness: Awake/alert Behavior During Therapy: WFL for tasks assessed/performed Overall Cognitive Status: Within Functional Limits for tasks assessed                                                Home Living Family/patient expects to be discharged to:: Private residence Living Arrangements: Spouse/significant other Available Help at Discharge: Family Type of Home: House Home Access: Level entry     Home Layout: Two level;Able to live on main  level with bedroom/bathroom     Bathroom Shower/Tub: Tub/shower unit   Bathroom Toilet: Handicapped height     Home Equipment: [place] - single point;Shower seat;Walker - 2 wheels;Bedside commode;Hand held shower head;Grab bars - tub/shower          Prior Functioning/Environment Level of Independence: Independent with assistive device(s)        Comments: uses a cane for mobility        OT Problem List: Decreased range of motion;Pain;Impaired balance (sitting and/or standing)         OT Goals(Current goals can be found in the care plan section) Acute Rehab OT Goals Patient Stated Goal: home tomorrow  OT Frequency:                AM-PAC PT "6 Clicks" Daily Activity     Outcome Measure Help from another person eating meals?: None Help from another person taking care of personal grooming?: A Little Help from another person toileting, which includes using toliet, bedpan, or urinal?: A Little Help from another person bathing (including washing, rinsing, drying)?: A Little Help from another person to put on and taking off regular upper body clothing?: A Little Help from another person to put on and taking off regular lower body clothing?: A Lot 6 Click Score: 18   End of Session Equipment Utilized During Treatment: Rolling walker;Gait belt  Activity Tolerance: Patient tolerated treatment well Patient left: in chair;with chair alarm set;with call bell/phone within reach;with family/visitor present  OT Visit Diagnosis: Unsteadiness on feet (R26.81);Pain Pain - Right/Left: Right Pain - part of body: Leg                Time: 4580-9983 OT Time Calculation (min): 30 min Charges:  OT General Charges $OT Visit: 1 Visit OT Evaluation $OT Eval Moderate Complexity: 1 Mod OT Treatments $Self Care/Home Management : 8-22 mins  [address] [address], OTR/L Acute [employer] Pager [phone] Office [phone]

## 2017-12-04 NOTE — Evaluation (Signed)
Physical Therapy Evaluation Patient Details Name: Kayla Wilson MRN: [id number] DOB: [birth year] Today's Date: 12/04/2017   History of Present Illness  77 y.o. female admitted on 11/04/17 for R facial droop and left arm weakness.  MRI revealed small areas of acute infarct (presumed cardioembolic in the L superior cerebellum and left superior insular cortex.  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  Pt with significant PMH of hemorrhagic stroke, HTN, A-fib, and R THA.  Clinical Impression  Pt admitted with above diagnosis. Pt currently with functional limitations due to the deficits listed below (see PT Problem List). Evaluation limited by hypotension. Requiring supervision to transfer from bed to [facility]. Once on [facility], patient complaining of dizziness. BP 70/47, reassessed 2 minutes later at 78/67. Once returned to supine, BP 83/57, HR 90, SpO2 99% on RA. RN and MD aware. However, suspect patient will progress well once vitals are stable. Pt will benefit from skilled PT to increase their independence and safety with mobility to allow discharge to the venue listed below.       Follow Up Recommendations Home health PT;Supervision for mobility/OOB    Equipment Recommendations  None recommended by PT    Recommendations for Other Services       Precautions / Restrictions Precautions Precautions: Fall Precaution Comments: watch BP Restrictions Weight Bearing Restrictions: No      Mobility  Bed Mobility Overal bed mobility: Modified Independent             General bed mobility comments: increased time coming out to right side  Transfers Overall transfer level: Needs assistance Equipment used: Straight cane Transfers: Sit to/from Stand;Stand Pivot Transfers Sit to Stand: Min guard Stand pivot transfers: Min guard       General transfer comment: min guard for safety  with transfer to standing from bed and then with stand pivot to [facility]  Ambulation/Gait                Stairs            Wheelchair Mobility    Modified Rankin (Stroke Patients Only)       Balance Overall balance assessment: Mild deficits observed, not formally tested                                           Pertinent Vitals/Pain Pain Assessment: Faces Faces Pain Scale: Hurts a little bit Pain Location: RLE Pain Descriptors / Indicators: Sore Pain Intervention(s): Monitored during session    Home Living Family/patient expects to be discharged to:: Private residence Living Arrangements: Spouse/significant other Available Help at Discharge: Family Type of Home: House Home Access: Level entry     Home Layout: Two level;Able to live on main level with bedroom/bathroom Home Equipment: Kasandra Knudsen - single point;Shower seat;Walker - 2 wheels;Bedside commode;Hand held shower head;Grab bars - tub/shower      Prior Function Level of Independence: Independent with assistive device(s)         Comments: uses a cane for mobility     Hand Dominance   Dominant Hand: Left    Extremity/Trunk Assessment   Upper Extremity Assessment Upper Extremity Assessment: Overall WFL for tasks assessed    Lower Extremity Assessment Lower Extremity Assessment: RLE deficits/detail RLE Deficits / Details: increased edema and redness. grossly at  least anti gravity       Communication   Communication: No difficulties  Cognition Arousal/Alertness: Awake/alert Behavior During Therapy: WFL for tasks assessed/performed Overall Cognitive Status: Within Functional Limits for tasks assessed                                        General Comments      Exercises     Assessment/Plan    PT Assessment Patient needs continued PT services  PT Problem List Decreased strength;Decreased activity tolerance;Decreased balance;Decreased mobility;Pain        PT Treatment Interventions DME instruction;Gait training;Stair training;Functional mobility training;Therapeutic activities;Therapeutic exercise;Balance training;Neuromuscular re-education;Patient/family education    PT Goals (Current goals can be found in the Care Plan section)  Acute Rehab PT Goals Patient Stated Goal: home tomorrow PT Goal Formulation: With patient Time For Goal Achievement: 12/18/17 Potential to Achieve Goals: Good    Frequency Min 3X/week   Barriers to discharge        Co-evaluation               AM-PAC PT "6 Clicks" Daily Activity  Outcome Measure Difficulty turning over in bed (including adjusting bedclothes, sheets and blankets)?: None Difficulty moving from lying on back to sitting on the side of the bed? : None Difficulty sitting down on and standing up from a chair with arms (e.g., wheelchair, bedside commode, etc,.)?: A Little Help needed moving to and from a bed to chair (including a wheelchair)?: A Little Help needed walking in hospital room?: A Little Help needed climbing 3-5 steps with a railing? : A Lot 6 Click Score: 19    End of Session Equipment Utilized During Treatment: Gait belt Activity Tolerance: Other (comment)(limited by hypotension) Patient left: in bed;with call bell/phone within reach Nurse Communication: Mobility status;Other (comment)(vitals) PT Visit Diagnosis: Dizziness and giddiness (R42);Difficulty in walking, not elsewhere classified (R26.2)    Time: 4696-2952 PT Time Calculation (min) (ACUTE ONLY): 31 min   Charges:   PT Evaluation $PT Eval Moderate Complexity: 1 Mod PT Treatments $Therapeutic Activity: 8-22 mins        Ellamae Sia, PT, DPT Acute Rehabilitation Services Pager [phone] Office [phone]  Willy Eddy 12/04/2017, 1:25 PM

## 2017-12-04 NOTE — Anesthesia Postprocedure Evaluation (Signed)
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 )     Patient location during evaluation: PACU Anesthesia Type: General Level of consciousness: awake and alert Pain management: pain level controlled Vital Signs Assessment: post-procedure vital signs reviewed and stable Respiratory status: spontaneous breathing, nonlabored ventilation, respiratory function stable and patient connected to nasal cannula oxygen Cardiovascular status: blood pressure returned to baseline and stable Postop Assessment: no apparent nausea or vomiting Anesthetic complications: no    Last Vitals:  Vitals:   12/03/17 2045 12/03/17 2330  BP: 107/87 92/62  Pulse: 77 99  Resp: 14 17  Temp: 36.5 C 36.7 C  SpO2: 100% 99%    Last Pain:  Vitals:   12/03/17 2330  TempSrc: Oral  PainSc: 2                  Latrelle Fuston DAVID

## 2017-12-04 NOTE — Progress Notes (Signed)
Subjective: Interval History: none.. Less swelling in her right leg.  Comfortable.  Objective: Vital signs in last 24 hours: Temp:  [97.3 F (36.3 C)-98.1 F (36.7 C)] 98.1 F (36.7 C) (09/15 0810) Pulse Rate:  [55-99] 82 (09/15 0440) Resp:  [13-17] 14 (09/15 0440) BP: (92-171)/(62-98) 108/93 (09/15 0810) SpO2:  [96 %-100 %] 99 % (09/15 0810)  Intake/Output from previous day: 09/14 0701 - 09/15 0700 In: 1574.7 [P.O.:30; I.V.:1544.7] Out: 350 [Urine:300; Blood:50] Intake/Output this shift: Total I/O In: 347.4 [I.V.:245.7; IV Piggyback:101.7] Out: -   Right groin incision healing without hematoma.  Moderate swelling in right leg.  Lab Results: Recent Labs    12/03/17 1018 12/04/17 0504  WBC 5.8 7.6  HGB 13.3 10.7*  HCT 39.9 31.9*  PLT 227 197   BMET Recent Labs    12/03/17 1018 12/04/17 0504  NA 126* 128*  K 3.1* 3.2*  CL 88* 91*  CO2 27 27  GLUCOSE 133* 134*  BUN <5* <5*  CREATININE 0.63 0.55  CALCIUM 8.9 7.9*    Studies/Results: Ct Angio Head W Or Wo Contrast  Result Date: 11/04/2017 CLINICAL DATA:  77 y/o  F; code stroke. EXAM: CT ANGIOGRAPHY HEAD AND NECK TECHNIQUE: Multidetector CT imaging of the head and neck was performed using the standard protocol during bolus administration of intravenous contrast. Multiplanar CT image reconstructions and MIPs were obtained to evaluate the vascular anatomy. Carotid stenosis measurements (when applicable) are obtained utilizing NASCET criteria, using the distal internal carotid diameter as the denominator. 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 The aneurysm of the aorta is incompletely included within the field of view. Right carotid system: No evidence of dissection, stenosis (50% or greater) or occlusion. Calcified plaque of the right  proximal ICA with mild less than 50% stenosis. Left carotid system: No evidence of dissection, stenosis (50% or greater) or occlusion. Vertebral arteries: Codominant. No evidence of dissection, stenosis (50% or greater) or occlusion. Skeleton: Moderate cervical spondylosis with multilevel disc and facet degenerative changes. No high-grade bony canal stenosis. No acute osseous abnormality is evident. Other neck: Multinodular thyroid goiter with the largest nodule measuring 3.8 x 3.7 cm in the axial plane (series 6, image 136). Upper chest: Pleuroparenchymal scarring in the lung apices bilaterally. Review of the MIP images confirms the above findings CTA HEAD FINDINGS Anterior circulation: Calcified plaque of carotid siphons with mild bilateral paraclinoid stenosis. Mild distal right M1 stenosis. Proximal occlusion of the left M2 superior division within the sylvian fissure (series 10, image 141 and series 9, image 115). No additional large vessel occlusion, stenosis, aneurysm, or vascular malformation. Posterior circulation: No significant stenosis, proximal occlusion, aneurysm, or vascular malformation. Venous sinuses: As permitted by contrast timing, patent. Anatomic variants: Bilateral fetal PCA. Large right A1, large anterior communicating artery, diminutive left A1 normal variant. Review of the MIP images confirms the above findings IMPRESSION: 1. Proximal left M2 superior division occlusion within the sylvian fissure. 2. 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 Nonemergent CTA of the chest is recommended. 3. No additional large vessel occlusion, aneurysm, or hemodynamically significant stenosis by NASCET criteria. 4. Thyroid goiter with nodules measuring up to 3.8 cm. Nonemergent thyroid ultrasound is recommended. These results were called by telephone at the time of interpretation on 11/04/2017 at 1:48 pm to  Dr. Rory Percy, who verbally acknowledged these results. Electronically  Signed   By: Kristine Garbe M.D.   On: 11/04/2017 13:51   Dg Chest 2 View  Result Date: 12/03/2017 CLINICAL DATA:  Resolving infection on CT EXAM: CHEST - 2 VIEW COMPARISON:  CT chest dated 11/28/2017 FINDINGS: Lungs are essentially clear.  No pleural effusion or pneumothorax. The heart is top-normal in size. Degenerative changes the lower thoracic spine. IMPRESSION: No evidence of acute cardiopulmonary disease. Electronically Signed   By: Julian Hy M.D.   On: 12/03/2017 11:01   Ct Head Wo Contrast  Result Date: 11/05/2017 CLINICAL DATA:  Follow-up examination status post arteriogram, stroke. EXAM: CT HEAD WITHOUT CONTRAST TECHNIQUE: Contiguous axial images were obtained from the base of the skull through the vertex without intravenous contrast. COMPARISON:  Prior head CT from 11/04/2017 FINDINGS: Brain: Stable atrophy with chronic small vessel ischemic disease. Chronic right frontal and parietal encephalomalacia again noted. Contrast material within the intracranial circulation including the dural sinuses. No acute intracranial hemorrhage. No new acute large vessel territory infarct. No mass lesion or midline shift. No hydrocephalus. No extra-axial fluid collection. Vascular: Contrast material seen throughout the arterial and venous vasculature. Skull: Scalp soft tissues and calvarium within normal limits. Sinuses/Orbits: Globes and orbital soft tissues demonstrate no acute finding.Paranasal sinuses are clear. No mastoid effusion. Other: None. IMPRESSION: Stable head CT. No complication identified status post recent arteriogram. No appreciable new or evolving infarct identified. Electronically Signed   By: Jeannine Boga M.D.   On: 11/05/2017 05:37   Ct Head Wo Contrast  Addendum Date: 11/05/2017   ADDENDUM REPORT: 11/05/2017 02:12 ADDENDUM: Upon request, an ASPECTS score has been calculated for this examination. ASPECTS determined to be 10. Results were discussed by telephone  with Dr. Lorraine Lax on 11/05/2017 at approximately 1:30 a.m. Electronically Signed   By: Jeannine Boga M.D.   On: 11/05/2017 02:12   Result Date: 11/05/2017 CLINICAL DATA:  Initial evaluation for acute altered mental status. EXAM: CT HEAD WITHOUT CONTRAST TECHNIQUE: Contiguous axial images were obtained from the base of the skull through the vertex without intravenous contrast. COMPARISON:  Prior CT and MRI from earlier the same day. FINDINGS: Brain: Moderately advanced cerebral atrophy with chronic small vessel ischemic disease again noted. Remote encephalomalacia at the right frontal and parietal lobes consistent with remote ischemic infarcts. No evidence for interval hemorrhage. No new acute large vessel territory infarct evident by CT. No mass lesion, midline shift, or mass effect. No hydrocephalus. No extra-axial fluid collection. Vascular: No new hyperdense vessel. Calcified atherosclerosis at the skull base. Skull: Scalp soft tissues demonstrate no acute abnormality. Calvarium intact. Sinuses/Orbits: Globes and orbital soft tissues demonstrate no acute finding. Paranasal sinuses mastoid air cells remain clear. Other: None. IMPRESSION: 1. Stable appearance of the head with no new acute intracranial abnormality identified. 2. 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 3. Advanced chronic microvascular ischemic disease with moderate cerebral atrophy. Electronically Signed: By: Jeannine Boga M.D. On: 11/05/2017 00:22   Ct Angio Neck W Or Wo Contrast  Result Date: 11/04/2017 CLINICAL DATA:  78 y/o  F; code stroke. EXAM: CT ANGIOGRAPHY HEAD AND NECK TECHNIQUE: Multidetector CT imaging of the head and neck was performed using the standard protocol during bolus administration of intravenous contrast. Multiplanar CT image reconstructions and MIPs were obtained to evaluate the vascular anatomy. Carotid stenosis measurements (when applicable) are obtained utilizing NASCET criteria, using the  distal internal carotid diameter as the denominator. 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 The aneurysm of the aorta is incompletely included within the field of view. Right carotid system: No evidence of dissection, stenosis (50% or greater) or occlusion. Calcified plaque of the right proximal ICA with mild less than 50% stenosis. Left carotid system: No evidence of dissection, stenosis (50% or greater) or occlusion. Vertebral arteries: Codominant. No evidence of dissection, stenosis (50% or greater) or occlusion. Skeleton: Moderate cervical spondylosis with multilevel disc and facet degenerative changes. No high-grade bony canal stenosis. No acute osseous abnormality is evident. Other neck: Multinodular thyroid goiter with the largest nodule measuring 3.8 x 3.7 cm in the axial plane (series 6, image 136). Upper chest: Pleuroparenchymal scarring in the lung apices bilaterally. Review of the MIP images confirms the above findings CTA HEAD FINDINGS Anterior circulation: Calcified plaque of carotid siphons with mild bilateral paraclinoid stenosis. Mild distal right M1 stenosis. Proximal occlusion of the left M2 superior division within the sylvian fissure (series 10, image 141 and series 9, image 115). No additional large vessel occlusion, stenosis, aneurysm, or vascular malformation. Posterior circulation: No significant stenosis, proximal occlusion, aneurysm, or vascular malformation. Venous sinuses: As permitted by contrast timing, patent. Anatomic variants: Bilateral fetal PCA. Large right A1, large anterior communicating artery, diminutive left A1 normal variant. Review of the MIP images confirms the above findings IMPRESSION: 1. Proximal left M2 superior division occlusion within the sylvian fissure. 2. Aneurysm of the aortic arch  measuring at least 4 cm, incompletely visualized. Nonemergent CTA of the chest is recommended. 3. No additional large vessel occlusion, aneurysm, or hemodynamically significant stenosis by NASCET criteria. 4. Thyroid goiter with nodules measuring up to 3.8 cm. Nonemergent thyroid ultrasound is recommended. These results were called by telephone at the time of interpretation on 11/04/2017 at 1:48 pm to Dr. Rory Percy, who verbally acknowledged these results. Electronically Signed   By: Kristine Garbe M.D.   On: 11/04/2017 13:51   Ct Chest Wo Contrast  Result Date: 11/29/2017 CLINICAL DATA:  Follow-up of right upper lobe pulmonary opacity. EXAM: CT CHEST WITHOUT CONTRAST TECHNIQUE: Multidetector CT imaging of the chest was performed following the standard protocol without IV contrast. COMPARISON:  11/06/2017. FINDINGS: Cardiovascular: Complex aortic and branch vessel anatomy including congenital double arch, grossly similar to the prior and better delineated on that dedicated contrast-enhanced exam. Aortic Atherosclerosis. Mild cardiomegaly, without pericardial effusion. Multivessel coronary artery atherosclerosis. Mediastinum/Nodes: Right-sided thyroid mass again identified, including at 4.2 x 3.7 cm. No mediastinal or definite hilar adenopathy, given limitations of unenhanced CT. Small hiatal hernia. Fluid level in the esophagus including on image 70/2. Lungs/Pleura: No pleural fluid. Mild biapical pleuroparenchymal scarring. Anteromedial right upper lobe dominant pulmonary opacity measures 1.7 x 1.1 cm on image 68/3. Slightly less masslike than at 1.9 x 1.4 cm on the prior exam. The surrounding ground-glass opacity has resolved. There are bilateral, right greater than left areas of clustered "tree-in-bud" nodularity. The majority of these are similar. Slight improvement in left lower lobe aeration. Medial right lower lobe clustered tree-in-bud nodularity is progressive, including on image 110/3. Many of the  nodular opacities are more distinct today, primarily felt to be due to decreased respiratory motion on the current exam. Upper Abdomen: Normal imaged portions of the liver, pancreas, adrenal glands, left kidney. Upper pole right renal 2.8 cm low-density lesion is likely a cyst. Old  granulomatous disease in the spleen. Abdominal aortic atherosclerosis. Musculoskeletal: Moderate thoracic spondylosis. IMPRESSION: 1. Right upper lobe pleural-based pulmonary opacity persists but is felt to be slightly decreased today, with decreased surrounding ground-glass. Findings favor resolving infection. Other areas of bilateral clustered "tree-in-bud" nodularity, primarily similar. Some are improved and some are slightly increased. Findings favor infectious bronchiolitis versus less likely aspiration. Consider antibiotic therapy and CT follow-up at 3 months to confirm ongoing resolution of the dominant lesion. 2. Small hiatal hernia. Esophageal air fluid level suggests dysmotility or gastroesophageal reflux. 3. Coronary artery atherosclerosis. Aortic Atherosclerosis (ICD10-I70.0). 4. 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 Better evaluated on prior CTA. Electronically Signed   By: Abigail Miyamoto M.D.   On: 11/29/2017 08:24   Mr Brain Wo Contrast  Result Date: 11/05/2017 CLINICAL DATA:  Stroke follow-up EXAM: MRI HEAD WITHOUT CONTRAST TECHNIQUE: Multiplanar, multiecho pulse sequences of the brain and surrounding structures were obtained without intravenous contrast. COMPARISON:  CT 11/05/2017, MRI 11/04/2017 FINDINGS: Brain: Multiple small areas of acute infarct. Compared with the recent MRI from yesterday, there are new areas of acute infarct in the left superior cerebellum and left superior insula. Small areas of acute infarct in the left frontal white matter and left parietal cortex unchanged. T2 shine through left frontal white matter less apparent on the current study. Moderate  atrophy. Advanced chronic microvascular ischemic change throughout the white matter bilaterally. Chronic hemorrhagic infarcts in the right frontal lobe and right parietal lobe. Multiple areas of chronic microhemorrhage are present including the pons, and thalamus bilaterally. Vascular: Normal arterial flow voids Skull and upper cervical spine: Negative Sinuses/Orbits: Negative Other: None IMPRESSION: Small areas of acute infarct in the left superior cerebellum and left superior insular cortex were not seen on the MRI yesterday. No change in small areas of acute infarct in the left frontal white matter and left parietal cortex. Advanced chronic microvascular ischemia with multiple areas of intracranial chronic hemorrhage. Electronically Signed   By: Franchot Gallo M.D.   On: 11/05/2017 12:50   Mr Brain Wo Contrast  Result Date: 11/04/2017 CLINICAL DATA:  Ataxia EXAM: MRI HEAD WITHOUT CONTRAST TECHNIQUE: Multiplanar, multiecho pulse sequences of the brain and surrounding structures were obtained without intravenous contrast. COMPARISON:  None. FINDINGS: BRAIN: There is multifocal mild diffusion weighted imaging abnormality within the left frontal white matter. The midline structures are normal. There are old right frontal lobe and right parietal lobe infarcts. Diffuse confluent hyperintense T2-weighted signal within the periventricular, deep and juxtacortical white matter, most commonly due to chronic ischemic microangiopathy. Generalized atrophy without lobar predilection. There are multiple, 3-5, chronic microhemorrhage is centrally, including the thalamus and pons. There is hemosiderin deposition at the sites of the old right frontal and parietal infarcts. VASCULAR: Major intracranial arterial and venous sinus flow voids are preserved. SKULL AND UPPER CERVICAL SPINE: The visualized skull base, calvarium, upper cervical spine and extracranial soft tissues are normal. SINUSES/ORBITS: No fluid levels or advanced  mucosal thickening. No mastoid or middle ear effusion. The orbits are normal. IMPRESSION: 1. Areas of signal abnormality on diffusion-weighted imaging in the left frontal white matter, without reduced apparent diffusion coefficient on the ADC map, likely indicating subacute ischemia. 2. Old right frontal and right parietal infarcts. 3. Atrophy and severe chronic microvascular ischemia. Electronically Signed   By: Ulyses Jarred M.D.   On: 11/04/2017 17:57   Ir Angiogram Extremity Left  Result Date: 11/09/2017 CLINICAL DATA:  Speech difficulties. Change in mental status. Right-sided  weakness. EXAM: LEFT EXTREMITY ARTERIOGRAPHY; IR ARCH CERVICOCEBRAL NON-SEL (MS) COMPARISON:  CT angiogram of the head and neck of 11/05/2017. MEDICATIONS: Heparin 1000 units IV; Ancef 2 g IV antibiotic was administered within 1 hour of the procedure. ANESTHESIA/SEDATION: General anesthesia CONTRAST:  Isovue 300 approximately 75 mL. Omnipaque 300 approximately 100 mL FLUOROSCOPY TIME:  Fluoroscopy Time: 33 minutes 0 seconds (605 mGy). COMPLICATIONS: None i none immediate mmediate. TECHNIQUE: Informed written consent was obtained from the patient's husband and son after a thorough discussion of the procedural risks, benefits and alternatives. All questions were addressed. Maximal Sterile Barrier Technique was utilized including caps, mask, sterile gowns, sterile gloves, sterile drape, hand hygiene and skin antiseptic. A timeout was performed prior to the initiation of the procedure. The right groin was prepped and draped in the usual sterile fashion. Thereafter using modified Seldinger technique, transfemoral access into the right common femoral artery was obtained without difficulty. Over a 0.035 inch guidewire, a 5 French Pinnacle sheath was inserted. Through this, and also over 0.035 inch guidewire, a 5 [place] JB 1 catheter was advanced to the aortic arch region. Subsequently an arteriogram was performed using a 5 French pigtail  catheter in the aortic arch region. FINDINGS: Multiple 5 French diagnostic catheters with different configurations were utilized to access branches of the aortic arch. 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 Also noted was a right paramedian deviation of the descending thoracic aorta. 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 Also noted was acute angulation with opacification of the left subclavian artery. Also noted were the origins of the vertebral arteries with antegrade flow to the cranial skull base. However, there was no opacification noted of the common carotid arteries. A selective vertebral artery angiogram was then performed of the left subclavian artery which demonstrated normal thyrocervical and costocervical trunks. The left vertebral artery was noted to arise at the origin of the left subclavian artery. An arteriogram performed in this region also demonstrated a circumferential narrowing with a split in the aortic arch. The upper of this was noted to cross the midline. Attempts to access this hypoplastic second aortic arch were met with persistent herniation into the larger arch due to the acute angulation and tortuosity. The procedure was stopped. The patient's general anesthesia was reversed and the patient extubated without difficulty. Upon recovery, the patient was able to maintain her blood gases, and also appropriately responsive with near equal motor strength in all extremities. 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 Nonvisualization of the common carotid arteries bilaterally. Presumably the origins of the common carotid arteries are from the  smaller, probably anteriorly positioned smaller of the aortic arches, a congenital developmental anomaly. PLAN: Patient transferred to the neuro ICU for further stroke management. Electronically Signed   By: Luanne Bras M.D.   On: 11/07/2017 10:23   Ct Cerebral Perfusion W Contrast  Result Date: 11/05/2017 CLINICAL DATA:  78 year old female with known left M2 occlusion, now with worsening symptoms. EXAM: CT PERFUSION BRAIN TECHNIQUE: Multiphase CT imaging of the brain was performed following IV bolus contrast injection. Subsequent parametric perfusion maps were calculated using RAPID software. CONTRAST:  10m ISOVUE-370 IOPAMIDOL (ISOVUE-370) INJECTION 76% COMPARISON:  Prior CTs and MRI from 11/04/2017. FINDINGS: CT Brain  Perfusion Findings: CBF (<30%) Volume: 69m Perfusion (Tmax>6.0s) volume: 149mMismatch Volume: 1566mSPECTS on noncontrast CT Head: 10 at 11:54 p.m. on 11/04/2017. Infarct Core: 0 mL Infarction Location:Subtly elevated mean transit time within the left MCA distribution, likely related to known left M2 occlusion. IMPRESSION: Negative CT perfusion with no evidence for core infarct. Elevated MTT within the left MCA distribution, likely related to known left M2 occlusion. Results were discussed with Dr. AroLorraine Lax telephone on 11/05/2017 at approximately 1:30 a.m. Electronically Signed   By: BenJeannine BogaD.   On: 11/05/2017 02:11   Ct Angio Chest Aorta W/cm &/or Wo/cm  Result Date: 11/07/2017 CLINICAL DATA:  Thoracic aortic aneurysm. EXAM: CT ANGIOGRAPHY CHEST WITH CONTRAST TECHNIQUE: Multidetector CT imaging of the chest was performed using the standard protocol during bolus administration of intravenous contrast. Multiplanar CT image reconstructions and MIPs were obtained to evaluate the vascular anatomy. CONTRAST:  64m69mOVUE-370 IOPAMIDOL (ISOVUE-370) INJECTION 76% COMPARISON:  None. FINDINGS: Cardiovascular: There is congenital double aortic arch anomaly present resulting in  vascular ring surrounding esophagus and trachea. The right subclavian and right common carotid arteries arise proximally from the larger right-sided arch, with the left common and subclavian arteries arising separately from the smaller left-sided aortic arch. Atherosclerosis of the thoracic aorta is noted without dissection. Tortuosity of descending thoracic aorta is noted. Proximal descending thoracic aorta has maximum measured diameter 3.2 cm. Proximal ascending thoracic aorta measures 3.5 cm. There is focal saccular aneurysm measuring 3.1 x 3.0 cm where the smaller left-sided aortic arch connects with the larger right-sided arch to form the descending thoracic aorta. Mediastinum/Nodes: 4.6 x 3.7 cm right thyroid mass is noted. Moderate size sliding-type hiatal hernia is noted. No definite adenopathy is noted. Lungs/Pleura: No pneumothorax or pleural effusion is noted. Mild biapical scarring is noted. Focal density measuring 1.9 x 1.4 cm is noted anteriorly in right upper lobe with surrounding ground-glass opacity. This may simply represent focal inflammation or pneumonia but neoplasm cannot be excluded. Small focal opacity is noted posteriorly in the left lower lobe concerning for atelectasis or infiltrate. Similar but smaller opacities are noted in the right upper lobe posteriorly. Upper Abdomen: No acute abnormality. Musculoskeletal: No chest wall abnormality. No acute or significant osseous findings. Review of the MIP images confirms the above findings. IMPRESSION: 1.9 x 1.4 cm focal density is noted anteriorly in right upper lobe with surrounding ground-glass opacity. This may recently represent focal pneumonia, but neoplasm cannot be excluded. Smaller opacities are noted throughout both lungs also suggesting pneumonia or possibly malignancy. Follow-up unenhanced chest CT in 2-3 weeks is recommended to ensure resolution of these abnormalities and rule out potential underlying malignancy. Congenital double  aortic arch anomaly is present resulting in vascular ring surrounding esophagus and trachea. Right subclavian and right common carotid arteries arise separately from the larger right-sided arch, with the left common and subclavian arteries arising separately from the significantly smaller left-sided aortic arch. 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 Follow-up CTA in 12 months is recommended to ensure stability. 4.6 cm right thyroid mass is noted. Thyroid ultrasound is recommended for further evaluation. Moderate size sliding-type hiatal hernia is noted. Aortic Atherosclerosis (ICD10-I70.0). Electronically Signed   By: JameMarijo ConceptionD.   On: 11/07/2017 08:08   Us TKorearoid  Result Date: 11/06/2017 CLINICAL DATA:  Thyroid nodules. EXAM: THYROID ULTRASOUND TECHNIQUE: Ultrasound examination of the  thyroid gland and adjacent soft tissues was performed. COMPARISON:  Neck CTA 11/04/2017 FINDINGS: Parenchymal Echotexture: Moderately heterogenous Isthmus: 0.6 cm Right lobe: 6.1 x 3.9 x 4.4 cm Left lobe: 5.1 x 1.6 x 2.1 cm _________________________________________________________ Estimated total number of nodules >/= 1 cm: 2 Number of spongiform nodules >/=  2 cm not described below (TR1): 0 Number of mixed cystic and solid nodules >/= 1.5 cm not described below (TR2): 0 _________________________________________________________ Nodule # 1: Location: Right; Mid Maximum size: 4.9 cm; Other 2 dimensions: 3.1 x 4.7 cm Composition: mixed cystic and solid (1) Echogenicity: hypoechoic (2) Shape: not taller-than-wide (0) Margins: smooth (0) Echogenic foci: none (0) ACR TI-RADS total points: 3. ACR TI-RADS risk category: TR3 (3 points). ACR TI-RADS recommendations: **Given size (>/= 2.5 cm) and appearance, fine needle aspiration of this mildly suspicious nodule should be considered based on TI-RADS  criteria. _________________________________________________________ Nodule # 2: Location: Left; Superior Maximum size: 0.8 cm; Other 2 dimensions: 0.6 x 0.7 cm Composition: mixed cystic and solid (1) Echogenicity: cannot determine (1) Shape: not taller-than-wide (0) Margins: ill-defined (0) Echogenic foci: macrocalcifications (1) ACR TI-RADS total points: 3. ACR TI-RADS risk category: TR3 (3 points). ACR TI-RADS recommendations: Given size (<1.4 cm) and appearance, this nodule does NOT meet TI-RADS criteria for biopsy or dedicated follow-up. _________________________________________________________ Nodule # 3: Location: Left; Inferior Maximum size: 2.5 cm; Other 2 dimensions: 1.3 x 2.1 cm Composition: solid/almost completely solid (2) Echogenicity: isoechoic (1) Shape: not taller-than-wide (0) Margins: ill-defined (0) Echogenic foci: macrocalcifications (1) ACR TI-RADS total points: 4. ACR TI-RADS risk category: TR4 (4-6 points). ACR TI-RADS recommendations: **Given size (>/= 1.5 cm) and appearance, fine needle aspiration of this moderately suspicious nodule should be considered based on TI-RADS criteria. _________________________________________________________ IMPRESSION: Bilateral thyroid nodules. Bilateral dominant thyroid nodules, nodule #1 and nodule #3 both meet criteria for ultrasound-guided biopsy. The above is in keeping with the ACR TI-RADS recommendations - J Am Coll Radiol 2017;14:587-595. Electronically Signed   By: Markus Daft M.D.   On: 11/06/2017 16:03   Ct Head Code Stroke Wo Contrast  Result Date: 11/04/2017 CLINICAL DATA:  Code stroke. 77 y/o F; left arm weakness and right facial droop. EXAM: CT HEAD WITHOUT CONTRAST TECHNIQUE: Contiguous axial images were obtained from the base of the skull through the vertex without intravenous contrast. COMPARISON:  None. FINDINGS: Brain: Small right inferolateral frontal lobe chronic infarction and small right posterolateral parietal lobe subacute to  chronic infarctions are present. There are advanced chronic microvascular ischemic changes of white matter and moderate volume loss of the brain. No acute stroke, hemorrhage, focal mass effect, extra-axial collection, or herniation. Vascular: Calcific atherosclerosis of the carotid siphons. No hyperdense vessel identified. Skull: Normal. Negative for fracture or focal lesion. Sinuses/Orbits: No acute finding. Other: None. ASPECTS [facility] Stroke Program Early CT Score) - Ganglionic level infarction (caudate, lentiform nuclei, internal capsule, insula, M1-M3 cortex): 7 - Supraganglionic infarction (M4-M6 cortex): 3 Total score (0-10 with 10 being normal): 10 IMPRESSION: 1. No acute stroke, hemorrhage, or mass effect. 2. Small chronic infarct in right frontal lobe and small subacute to chronic infarct in the right parietal lobe. 3. Advanced chronic microvascular ischemic changes and moderate volume loss of the brain. 4. ASPECTS is 10 These results were called by telephone at the time of interpretation on 11/04/2017 at 1:31 pm to Dr. Rory Percy, who verbally acknowledged these results. Electronically Signed   By: Kristine Garbe M.D.   On: 11/04/2017 13:38   Ir Arch Cervicicebral Non-sel (ms)  Result Date: 11/09/2017 CLINICAL DATA:  Speech difficulties. Change in mental status. Right-sided weakness. EXAM: LEFT EXTREMITY ARTERIOGRAPHY; IR ARCH CERVICOCEBRAL NON-SEL (MS) COMPARISON:  CT angiogram of the head and neck of 11/05/2017. MEDICATIONS: Heparin 1000 units IV; Ancef 2 g IV antibiotic was administered within 1 hour of the procedure. ANESTHESIA/SEDATION: General anesthesia CONTRAST:  Isovue 300 approximately 75 mL. Omnipaque 300 approximately 100 mL FLUOROSCOPY TIME:  Fluoroscopy Time: 33 minutes 0 seconds (605 mGy). COMPLICATIONS: None i none immediate mmediate. TECHNIQUE: Informed written consent was obtained from the patient's husband and son after a thorough discussion of the procedural risks, benefits  and alternatives. All questions were addressed. Maximal Sterile Barrier Technique was utilized including caps, mask, sterile gowns, sterile gloves, sterile drape, hand hygiene and skin antiseptic. A timeout was performed prior to the initiation of the procedure. The right groin was prepped and draped in the usual sterile fashion. Thereafter using modified Seldinger technique, transfemoral access into the right common femoral artery was obtained without difficulty. Over a 0.035 inch guidewire, a 5 French Pinnacle sheath was inserted. Through this, and also over 0.035 inch guidewire, a 5 [place] JB 1 catheter was advanced to the aortic arch region. Subsequently an arteriogram was performed using a 5 French pigtail catheter in the aortic arch region. FINDINGS: Multiple 5 French diagnostic catheters with different configurations were utilized to access branches of the aortic arch. 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 Also noted was a right paramedian deviation of the descending thoracic aorta. 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 Also noted was acute angulation with opacification of the left subclavian artery. Also noted were the origins of the vertebral arteries with antegrade flow to the cranial skull base. However, there was no opacification noted of the common carotid arteries. A selective vertebral artery angiogram was then performed of the left subclavian artery which demonstrated normal thyrocervical and costocervical trunks. The left vertebral artery was noted to arise at the origin of the left subclavian artery. An arteriogram performed in this region also demonstrated a circumferential narrowing with a split in the aortic arch. The upper of this was noted to cross the midline. Attempts to access this hypoplastic second aortic arch were met with persistent herniation into the  larger arch due to the acute angulation and tortuosity. The procedure was stopped. The patient's general anesthesia was reversed and the patient extubated without difficulty. Upon recovery, the patient was able to maintain her blood gases, and also appropriately responsive with near equal motor strength in all extremities. 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 Nonvisualization of the common carotid arteries bilaterally. 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 PLAN: Patient transferred to the neuro ICU for further stroke management. Electronically Signed   By: Luanne Bras M.D.   On: 11/07/2017 10:23   Anti-infectives: Anti-infectives (From admission, onward)   Start     Dose/Rate Route Frequency Ordered Stop   12/04/17 0000  ceFAZolin (ANCEF) IVPB 2g/100 mL premix     2 g 200 mL/hr over 30 Minutes Intravenous Every 8 hours 12/03/17 1858 12/04/17 0846   12/03/17 1545  ceFAZolin (ANCEF) IVPB 2g/100  mL premix     2 g 200 mL/hr over 30 Minutes Intravenous  Once 12/03/17 1541 12/03/17 1601   12/03/17 1542  ceFAZolin (ANCEF) 2-4 GM/100ML-% IVPB    Note to Pharmacy:  Henrine Screws   : cabinet override      12/03/17 1542 12/04/17 0344   12/03/17 1542  ceFAZolin (ANCEF) 2-4 GM/100ML-% IVPB    Note to Pharmacy:  Henrine Screws   : cabinet override      12/03/17 1542 12/04/17 0344      Assessment/Plan: s/p Procedure(s): 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  Had episode of hypotension this morning when she was first up walking.  She had some nausea associated with this as well.  Will continue to mobilize and watch.  Hold her  antihypertensive medications this afternoon.  Potentially home in morning   LOS: 1 day   Todd Early 12/04/2017, 10:50 AM

## 2017-12-05 ENCOUNTER — Telehealth: Payer: Self-pay | Admitting: Vascular Surgery

## 2017-12-05 MED ORDER — OXYCODONE HCL 5 MG PO TABS: 5.0000 mg | ORAL_TABLET | Freq: Four times a day (QID) | ORAL | 0 refills | Status: DC | PRN

## 2017-12-05 NOTE — Progress Notes (Addendum)
[facility] [place]  Subjective  - Doing well and rady to go home.   Objective 119/83 84 98.8 F (37.1 C) (Oral) 12 97%  Intake/Output Summary (Last 24 hours) at 12/05/2017 0732 Last data filed at 12/04/2017 2321 Gross per 24 hour  Intake 467.37 ml  Output -  Net 467.37 ml    Right groin without frank hematoma, minimal edema at incision Palpable DP pulses B, chronic venous insufficieny brawny skin changes.  Moderate right LE edema Heart A fib irregular Lungs non labored breathing  Assessment/Planning: POD # 2 Repair of right femoral false aneurysm  Restart Eliquis today as normal No home equipment needed F/U 2-3 weeks for incision check with Dr. Armstead Peaks 12/05/2017 7:32 AM --  Laboratory Lab Results: Recent Labs    12/03/17 1018 12/04/17 0504  WBC 5.8 7.6  HGB 13.3 10.7*  HCT 39.9 31.9*  PLT 227 197   BMET Recent Labs    12/03/17 1018 12/04/17 0504  NA 126* 128*  K 3.1* 3.2*  CL 88* 91*  CO2 27 27  GLUCOSE 133* 134*  BUN <5* <5*  CREATININE 0.63 0.55  CALCIUM 8.9 7.9*    COAG Lab Results  Component Value Date   INR 1.12 11/07/2017   INR 1.14 11/04/2017   INR 1.68 (H) 09/03/2014   No results found for: PTT  I have examined the patient, reviewed and agree with above.  Curt Jews, MD 12/05/2017 9:33 AM

## 2017-12-05 NOTE — Progress Notes (Signed)
12/05/2017 11:32 AM Discharge AVS meds taken today and those due this evening reviewed.  Follow-up appointments and when to call md reviewed.  D/C IV and TELE.  Questions and concerns addressed.   D/C home per orders. [place]

## 2017-12-05 NOTE — Telephone Encounter (Signed)
sch appt phone NA mld ltr 12/27/17 830am p/o MD

## 2017-12-05 NOTE — Progress Notes (Signed)
PT Cancellation Note  Patient Details Name: Kayla Wilson MRN: [id number] DOB: October 03, 1940   Cancelled Treatment:    Reason Eval/Treat Not Completed: Other (comment)(Dressed to go home. Refused PT.  No needs per pt.) Pt states she has a RW at home to use and assist at home.  States she is walking in room with rW without assist and is going home.   Godfrey Pick Yena Tisby 12/05/2017, 11:06 AM Joevanni Roddey,PT Acute Rehabilitation Services Pager:  [phone]  Office:  [phone]

## 2017-12-05 NOTE — Progress Notes (Signed)
12/05/2017 Pt IV d/c with no complications, IV tip intact. Pt d/c from tele, [place] notified.  Amanda Cockayne, RN

## 2017-12-08 ENCOUNTER — Ambulatory Visit: Payer: Self-pay | Admitting: Adult Health

## 2017-12-13 NOTE — Discharge Summary (Signed)
[facility] Discharge Summary   Patient ID:  Kayla Wilson MRN: [id number] DOB/AGE: 1940/04/14 77 y.o.  Admit date: 12/03/2017 Discharge date: 12/05/2017 Date of Surgery: 12/03/2017 Surgeon: Juliann Mule): Early, [name], MD  Admission Diagnosis: hx of stroke/leg pain  Discharge Diagnoses:  hx of stroke/leg pain  Secondary Diagnoses: Past Medical History:  Diagnosis Date  . Atrial fibrillation ([place])   . Colon polyps   . Complication of anesthesia    difficulty awakening  . CVA (cerebral vascular accident) ([place]) 11/04/2017  . Diverticulosis   . Hypertension   . 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 No intervention required    . Stroke [facility])     Procedure(s): REPAIR OF RIGHT FEMORAL FALSE ANEURYSM  Discharged Condition: good  HPI: Kayla Wilson is a 77 y.o. female presenting to the emergency room with swelling of her right leg.  She had presented with acute stroke to [facility] 1 month ago.  She underwent cerebral arteriogram via her right groin.  She eventually was discharged home.  She reports that she has noted discomfort and swelling in her right leg since the procedure.  This is become markedly progressed over the last several days and was very severe yesterday.  She presented to the emergency room today for further evaluation.  She also has noted increased swelling and pain in her right groin.  No history of prior peripheral vascular occlusive disease.  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  This also shows compression of her common femoral vein.   Hospital Course:  Kayla Wilson is a 77 y.o. female is S/P  Procedure(s): REPAIR OF RIGHT FEMORAL FALSE ANEURYSM  Post op episode of hypotension   POD# 2 Eliquis restarted ambulating without assistance.  Hypotension resolved.  No recurrent aneurysm.    Disposition stable for discharge.    Consults:  Treatment Team:  Rosetta Posner, MD  Significant Diagnostic Studies: CBC Lab Results  Component Value Date   WBC 7.6 12/04/2017   HGB 10.7 (L) 12/04/2017   HCT 31.9 (L) 12/04/2017   MCV 87.2 12/04/2017   PLT 197 12/04/2017    BMET    Component Value Date/Time   NA 128 (L) 12/04/2017 0504   K 3.2 (L) 12/04/2017 0504   CL 91 (L) 12/04/2017 0504   CO2 27 12/04/2017 0504   GLUCOSE 134 (H) 12/04/2017 0504   BUN <5 (L) 12/04/2017 0504   CREATININE 0.55 12/04/2017 0504   CALCIUM 7.9 (L) 12/04/2017 0504   GFRNONAA >60 12/04/2017 0504   GFRAA >60 12/04/2017 0504   COAG Lab Results  Component Value Date   INR 1.12 11/07/2017   INR 1.14 11/04/2017   INR 1.68 (H) 09/03/2014     Disposition:  Discharge to :Home Discharge Instructions    Call MD for:  redness, tenderness, or signs of infection (pain, swelling, bleeding, redness, odor or green/yellow discharge around incision site)   Complete by:  As directed    Call MD for:  severe or increased pain, loss or decreased feeling  in affected limb(s)   Complete by:  As directed    Call MD for:  temperature >100.5   Complete by:  As directed    Resume previous diet   Complete by:  As directed      Allergies as of 12/05/2017      Reactions  Ace Inhibitors Anaphylaxis   Angioedema, perioperative   Celebrex [celecoxib] Itching, Other (See Comments)   Twitching    Antihistamines, Chlorpheniramine-type Other (See Comments)   A-fib   Aspirin Other (See Comments)   History of hemorrhagic stroke   Cortisone Other (See Comments)   "cortisone shot made me jerk"   Lidocaine Other (See Comments)   Pt was fine for ~2 days after she received medication and states she then got "sore all over and could not move"   Phenytoin Swelling   Swelling mostly in arms   Sulfonamide Derivatives Itching      Medication List    TAKE these medications   apixaban 5 MG Tabs tablet Commonly  known as:  ELIQUIS Take 1 tablet (5 mg total) by mouth 2 (two) times daily.   atorvastatin 40 MG tablet Commonly known as:  LIPITOR Take 1 tablet (40 mg total) by mouth daily at 6 PM.   carvedilol 12.5 MG tablet Commonly known as:  COREG Take 1 tablet (12.5 mg total) by mouth 2 (two) times daily.   hydrochlorothiazide 25 MG tablet Commonly known as:  HYDRODIURIL Take 1 tablet (25 mg total) by mouth daily.   oxyCODONE 5 MG immediate release tablet Commonly known as:  Oxy IR/ROXICODONE Take 1 tablet (5 mg total) by mouth every 6 (six) hours as needed for moderate pain.   vitamin B-12 500 MCG tablet Commonly known as:  CYANOCOBALAMIN Take 500 mcg by mouth daily.      Verbal and written Discharge instructions given to the patient. Wound care per Discharge AVS Follow-up Information    Early, [name], MD. Schedule an appointment as soon as possible for a visit in 2 week(s).   Specialties:  Vascular Surgery, Cardiology Contact information: [address] [place] [address] [phone]           Signed: Roxy Horseman 12/13/2017, 9:57 AM

## 2017-12-14 ENCOUNTER — Other Ambulatory Visit: Payer: Self-pay | Admitting: *Deleted

## 2017-12-14 DIAGNOSIS — I10 Essential (primary) hypertension: Secondary | ICD-10-CM

## 2017-12-14 MED ORDER — HYDROCHLOROTHIAZIDE 25 MG PO TABS: 25.0000 mg | ORAL_TABLET | Freq: Every day | ORAL | 1 refills | Status: DC

## 2017-12-27 ENCOUNTER — Encounter: Payer: Self-pay | Admitting: Vascular Surgery

## 2017-12-27 ENCOUNTER — Ambulatory Visit (INDEPENDENT_AMBULATORY_CARE_PROVIDER_SITE_OTHER): Payer: Self-pay | Admitting: Vascular Surgery

## 2017-12-27 ENCOUNTER — Other Ambulatory Visit: Payer: Self-pay

## 2017-12-27 VITALS — BP 147/95 | HR 82 | Temp 98.0°F | Resp 16 | Ht 67.5 in | Wt 123.0 lb

## 2017-12-27 DIAGNOSIS — I724 Aneurysm of artery of lower extremity: Secondary | ICD-10-CM

## 2017-12-27 DIAGNOSIS — T81718A Complication of other artery following a procedure, not elsewhere classified, initial encounter: Secondary | ICD-10-CM

## 2017-12-27 NOTE — Progress Notes (Signed)
Vitals:   12/27/17 0824  BP: (!) 141/90  Pulse: 72  Resp: 16  Temp: 98 F (36.7 C)  TempSrc: Oral  SpO2: 99%  Weight: 123 lb (55.8 kg)  Height: 5' 7.5" (1.715 m)

## 2017-12-27 NOTE — Progress Notes (Signed)
   Patient name: Kayla Wilson MRN: [id number] DOB: 28-Mar-1940 Sex: female  REASON FOR VISIT: Follow-up repair of right femoral pseudoaneurysm on 12/03/2017  HPI: Kayla Wilson is a 77 y.o. female who presented acutely with leg swelling and pain in her right groin.  She had undergone cerebral arteriogram and neuro interventional radiology approximately 3 to 4 weeks prior to this presentation.  She had developed progressive enlargement of a pseudoaneurysm and it had compression of her right femoral vein resulting in marked swelling in her right leg.  Duplex showed no DVT but did show a large false aneurysm.  She was taken urgently to the operating room where she underwent repair of the femoral false aneurysm and evacuation of hematoma.  She did well and was discharged home  Current Outpatient Medications  Medication Sig Dispense Refill  . apixaban (ELIQUIS) 5 MG TABS tablet Take 1 tablet (5 mg total) by mouth 2 (two) times daily. 60 tablet 2  . atorvastatin (LIPITOR) 40 MG tablet Take 1 tablet (40 mg total) by mouth daily at 6 PM. 30 tablet 2  . carvedilol (COREG) 12.5 MG tablet Take 1 tablet (12.5 mg total) by mouth 2 (two) times daily. 180 tablet 3  . hydrochlorothiazide (HYDRODIURIL) 25 MG tablet Take 1 tablet (25 mg total) by mouth daily. 90 tablet 1  . vitamin B-12 (CYANOCOBALAMIN) 500 MCG tablet Take 500 mcg by mouth daily.    Marland Kitchen oxyCODONE (OXY IR/ROXICODONE) 5 MG immediate release tablet Take 1 tablet (5 mg total) by mouth every 6 (six) hours as needed for moderate pain. (Patient not taking: Reported on 12/27/2017) 6 tablet 0   No current facility-administered medications for this visit.      PHYSICAL EXAM: Vitals:   12/27/17 0824 12/27/17 0827  BP: (!) 141/90 (!) 147/95  Pulse: 72 82  Resp: 16   Temp: 98 F (36.7 C)   TempSrc: Oral   SpO2: 99%   Weight: 123 lb (55.8 kg)   Height: 5' 7.5" (1.715 m)     GENERAL: The patient is a well-nourished  female, in no acute distress. The vital signs are documented above. Right groin incision healing with normal pulse.  2+ femoral pulse.  Moderate pitting edema in right leg versus left  MEDICAL ISSUES: Stable overall.  We will continue full activities.  She reports that the swelling is continued to improve.  I have explained that if this persist that she could have relief with compression garment.  She will see [place] again on an as needed   Rosetta Posner, MD [facility] Tel [phone] Pager [phone]

## 2018-01-05 ENCOUNTER — Encounter: Payer: Self-pay | Admitting: Adult Health

## 2018-01-05 ENCOUNTER — Ambulatory Visit: Payer: Medicare Other | Admitting: Adult Health

## 2018-01-05 VITALS — BP 134/74 | HR 73 | Ht 68.0 in | Wt 128.0 lb

## 2018-01-05 DIAGNOSIS — I6300 Cerebral infarction due to thrombosis of unspecified precerebral artery: Secondary | ICD-10-CM | POA: Diagnosis not present

## 2018-01-05 DIAGNOSIS — I10 Essential (primary) hypertension: Secondary | ICD-10-CM

## 2018-01-05 DIAGNOSIS — I4821 Permanent atrial fibrillation: Secondary | ICD-10-CM | POA: Diagnosis not present

## 2018-01-05 DIAGNOSIS — E782 Mixed hyperlipidemia: Secondary | ICD-10-CM | POA: Diagnosis not present

## 2018-01-05 NOTE — Patient Instructions (Signed)
Continue Eliquis (apixaban) daily  and lipitor  for secondary stroke prevention  Continue to follow up with PCP regarding cholesterol and blood pressure management   Schedule appointment with cardiologist to re-establish care for atrial fibrillation management and eliquis management   Increase activity slowly to help with joint pain and feelings of generalized weakness and maintain a healthy diet  Continue to monitor blood pressure at home  Maintain strict control of hypertension with blood pressure goal below 130/90, diabetes with hemoglobin A1c goal below 6.5% and cholesterol with LDL cholesterol (bad cholesterol) goal below 70 mg/dL. I also advised the patient to eat a healthy diet with plenty of whole grains, cereals, fruits and vegetables, exercise regularly and maintain ideal body weight.  Followup in the future with me in 3 months or call earlier if needed       Thank you for coming to see [place] at [facility]. I hope we have been able to provide you high quality care today.  You may receive a patient satisfaction survey over the next few weeks. We would appreciate your feedback and comments so that we may continue to improve ourselves and the health of our patients.

## 2018-01-05 NOTE — Progress Notes (Signed)
[facility] [address] [place]. [place] [address] [phone]       OFFICE FOLLOW UP NOTE  Kayla Wilson Date of Birth:  [birth year] Medical Record Number:  [id number]   Reason for Referral:  hospital stroke follow up  CHIEF COMPLAINT:  Chief Complaint  Patient presents with  . Follow-up    Stroke follow up, Patient is accomplanied by her daughter today. Rm 9. Patient has some concerns about her Eliquis.     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 History obtained from patient, daughter and chart review. Reviewed all radiology images and labs personally.  Kayla Wilson a 77 y.o.femalewith history of afib ( not on anti- coagulation, pt refuses), CVA ( 1995; hemorrhagic), HTN, who presented with right facial droop and left arm flaccid.Shedid not receive IV t-PA (pt. Declined.).  CT head was negative for acute abnormality.  MRI brain showed subacute ischemia left frontal white matter.  Repeat MRI brain showed small areas of acute infarct in left superior cerebellum and left superior insular cortex there were not seen on the prior MRI.  CTA head and neck showed proximal left M2 superior division occlusion within the sylvian fissure. Attempted IR but could not be accessed d/t double aortic arches.  2D echo showed an EF of 50 to 55% without cardiac source of embolus.  It was determined as recent infarcts were embolic secondary to known atrial fibrillation and his patient was not on antithrombotic PTA, it was recommended to start Eliquis for atrial fibrillation secondary stroke prevention.  Patient does have a history of hemorrhagic stroke with multiple areas of intracranial chronic hemorrhage and use of anticoagulation will have to be monitored.  LDL 110 and recommended initiating Lipitor 40 mg  daily.  HTN stable during admission and recommended long-term BP goal normotensive range.  A1c 5.2.  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  Patient was discharged home in satisfactory condition without therapy needs.  Patient is being seen today for hospital follow-up and is accompanied by her daughter.  She continues to do well from a stroke standpoint without residual deficits or recurring of symptoms.  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  She does state overall she does have a generalized weakness and increased generalized joint pain.  She does state that she has not been as active as she was prior to the stroke.  She has not participated in any therapy post discharge as all symptoms resolved.  She does use a cane for ambulation but used prior to the stroke from history of hip replacement.  She does continue to take Eliquis without side effects of bleeding or bruising.  Continues to take Lipitor without side effects of myalgias.  Blood pressure today satisfactory at 134/74.  Patient does have a cardiologist, Dr. Rayann Heman, that she has seen in the past for management of atrial fibrillation but as patient refused to be on anticoagulation, she was deferred back to her PCP for AF management.  Since hospital discharge, patient has not scheduled appointment with Dr. Rayann Heman  for AF management.  Patient does have concerns regarding price of Eliquis.  She is also questioning if Eliquis can be stopped and she may need a mole removed on her face.  She has an appointment with dermatology on 01/27/2018.  No further concerns at this time.  Denies new or worsening stroke/TIA symptoms.    ROS:   14 system review of systems  performed and negative with exception of swelling in legs and moles  PMH:  Past Medical History:  Diagnosis Date  . Atrial fibrillation ([place])   . Colon polyps   . Complication of anesthesia    difficulty awakening  . CVA (cerebral vascular accident) ([place]) 11/04/2017  . Diverticulosis   . Hypertension   . IATROGENIC CEREBROVASCULAR INFARCT/HEMORRHAGE NE 03/22/1993   Qualifier: Diagnosis of  By: Leanne Chang MD, [place] per pt's report-Dr Vertell Limber- hemorrhagic stroke. No intervention required    . Stroke [facility])     PSH:  Past Surgical History:  Procedure Laterality Date  . APPENDECTOMY    . EYE SURGERY Left    vitrectomy  . FALSE ANEURYSM REPAIR Right 12/03/2017   Procedure: REPAIR OF RIGHT FEMORAL FALSE ANEURYSM;  Surgeon: Rosetta Posner, MD;  Location: [facility];  Service: Vascular;  Laterality: Right;  . IR ANGIOGRAM EXTREMITY LEFT  11/05/2017  . IR ARCH CERVICICEBRAL NON-SEL ([place])  11/05/2017  . RADIOLOGY WITH ANESTHESIA N/A 11/05/2017   Procedure: RADIOLOGY WITH ANESTHESIA;  Surgeon: Luanne Bras, MD;  Location: [facility];  Service: Radiology;  Laterality: N/A;  . TOTAL HIP ARTHROPLASTY Right 09/03/2014   Procedure: RIGHT TOTAL HIP ARTHROPLASTY ANTERIOR APPROACH;  Surgeon: Renette Butters, MD;  Location: [facility];  Service: Orthopedics;  Laterality: Right;  . TUBAL LIGATION      Social History:  Social History   Socioeconomic History  . Marital status: Married    Spouse name: Not on file  . Number of children: Not on file  . Years of education: Not on file  . Highest education level: Not on file  Occupational History  . Not on file  Social Needs  . Financial resource strain: Not on file  . Food insecurity:    Worry: Not on file    Inability: Not on file  . Transportation needs:    Medical: Not on file    Non-medical: Not on file  Tobacco Use  . Smoking status: Never Smoker  . Smokeless tobacco: Never Used  Substance and Sexual Activity  . Alcohol use: No  . Drug use: No  .  Sexual activity: Not Currently  Lifestyle  . Physical activity:    Days per week: Not on file    Minutes per session: Not on file  . Stress: Not on file  Relationships  . Social connections:    Talks on phone: Not on file    Gets together: Not on file    Attends religious service: Not on file    Active member of club or organization: Not on file    Attends meetings of clubs or organizations: Not on file    Relationship status: Not on file  . Intimate partner violence:    Fear of current or ex partner: Not on file    Emotionally abused: Not on file    Physically abused: Not on file    Forced sexual activity: Not on file  Other Topics Concern  . Not on file  Social History Narrative   Married 57 years    - 28  children 6 girls and one boy.    - three children live in [place] live in [place].       11 grand children and 7 great grandchildren      Worked at [employer] for four years and then started having children       Diet: Fast food and cooks at home      Likes to [employer], flowers and working in the yard, Firefighter.           Family History:  Family History  Problem Relation Age of Onset  . Diabetes Brother   . Heart disease Brother   . Hypertension Brother   . Stroke Mother   . Hypertension Mother   . Heart disease Father        CABG, MI  . Cancer Father        skin    Medications:   Current Outpatient Medications on File Prior to Visit  Medication Sig Dispense Refill  . apixaban (ELIQUIS) 5 MG TABS tablet Take 1 tablet (5 mg total) by mouth 2 (two) times daily. 60 tablet 2  . atorvastatin (LIPITOR) 40 MG tablet Take 1 tablet (40 mg total) by mouth daily at 6 PM. 30 tablet 2  . carvedilol (COREG) 12.5 MG tablet Take 1 tablet (12.5 mg total) by mouth 2 (two) times daily. 180 tablet 3  . hydrochlorothiazide (HYDRODIURIL) 25 MG tablet Take 1 tablet (25 mg total) by mouth daily. 90 tablet 1  . vitamin B-12  (CYANOCOBALAMIN) 500 MCG tablet Take 500 mcg by mouth daily.    Marland Kitchen oxyCODONE (OXY IR/ROXICODONE) 5 MG immediate release tablet Take 1 tablet (5 mg total) by mouth every 6 (six) hours as needed for moderate pain. (Patient not taking: Reported on 12/27/2017) 6 tablet 0   No current facility-administered medications on file prior to visit.     Allergies:   Allergies  Allergen Reactions  . Ace Inhibitors Anaphylaxis    Angioedema, perioperative  . Celebrex [Celecoxib] Itching and Other (See Comments)    Twitching   . Antihistamines, Chlorpheniramine-Type Other (See Comments)    A-fib  . Aspirin Other (See Comments)    History of hemorrhagic stroke  . Cortisone Other (See Comments)    "cortisone shot made me jerk"  . Lidocaine Other (See Comments)    Pt was fine for ~2 days after she received medication and states she then got "sore all over and could not move"  . Phenytoin Swelling    Swelling mostly in arms  . Sulfonamide Derivatives Itching     Physical Exam  Vitals:   01/05/18 1105  BP: 134/74  Pulse: 73  Weight: 128 lb (58.1 kg)  Height: 5\' 8"  (1.727 m)   Body mass index is 19.46 kg/m. No exam data present  General: well developed, well nourished, pleasant elderly Caucasian female, seated, in no evident distress Head: head normocephalic and atraumatic.   Neck: supple with no carotid or supraclavicular bruits Cardiovascular: regular rate and rhythm, no murmurs Musculoskeletal: no deformity Skin:  no rash/petichiae Vascular:  Normal pulses all extremities  Neurologic Exam Mental Status: Awake and fully alert. Oriented to place and time. Recent and remote memory intact. Attention span, concentration and fund of knowledge appropriate. Mood and affect appropriate.  Cranial Nerves: Fundoscopic exam reveals sharp disc margins. Pupils equal, briskly reactive to light. Extraocular movements full without nystagmus. Visual fields full to confrontation. Hearing intact. Facial  sensation  intact. Face, tongue, palate moves normally and symmetrically.  Motor: Normal bulk and tone. Normal strength in all tested extremity muscles. Sensory.: intact to touch , pinprick , position and vibratory sensation.  Coordination: Rapid alternating movements normal in all extremities. Finger-to-nose and heel-to-shin performed accurately bilaterally. Gait and Station: Arises from chair without difficulty. Stance is slightly hunched. Gait demonstrates normal stride length and balance with use of a cane. Unable to heel, toe and tandem walk without difficulty.  Reflexes: 1+ and symmetric. Toes downgoing.    NIHSS  0 Modified Rankin  0 HAS-BLED 3 CHA2DS2-VASc 6   Diagnostic Data (Labs, Imaging, Testing)  Ct Head Code Stroke Wo Contrast 11/04/2017 IMPRESSION:  1. No acute stroke, hemorrhage, or mass effect.  2. Small chronic infarct in right frontal lobe and small subacute to chronic infarct in the right parietal lobe.  3. Advanced chronic microvascular ischemic changes and moderate volume loss of the brain.  4. ASPECTS is 10   Ct Angio Head W Or Wo Contrast Ct Angio Neck W Or Wo Contrast 11/04/2017 1. Proximal left M2 superior division occlusion within the sylvian fissure.  2. 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 Nonemergent CTA of the chest is recommended.  3. No additional large vessel occlusion, aneurysm, or hemodynamically significant stenosis by NASCET criteria.  4. Thyroid goiter with nodules measuring up to 3.8 cm. Nonemergent thyroid ultrasound is recommended.   Mr Brain Wo Contrast 11/04/2017 1. Areas of signal abnormality on diffusion-weighted imaging in the left frontal white matter, without reduced apparent diffusion coefficient on the ADC map, likely indicating subacute ischemia.  2. Old right frontal and right parietal infarcts.  3. Atrophy and severe chronic microvascular ischemia.   Ct Cerebral Perfusion W  Contrast 11/05/2017 Negative CT perfusion with no evidence for core infarct. Elevated MTT within the left MCA distribution, likely related to known left M2 occlusion.  Cerebral Angiogram - IR - Dr Estanislado Pandy - no intervention 11/05/2017 S/P arch aortogram and Lt subclavian arteriogram RT CFA approach. Findings. 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 Vertebral arteries noted bilaterally with antegrade flow.  Ct Head Wo Contrast 11/05/2017 Stable head CT. No complication identified status post recent arteriogram. No appreciable new or evolving infarct identified.   Ct Head Wo Contrast 11/05/2017  ADDENDUM REPORT: 11/05/2017 02:12 ASPECTS determined to be 10.  11/05/2017 IMPRESSION:  1. Stable appearance of the head with no new acute intracranial abnormality identified.  2. 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  3. Advanced chronic microvascular ischemic disease with moderate cerebral atrophy.   MRI Brain Wo Contrast - repeat 11/05/2017 IMPRESSION: 1. Small areas of acute infarct in the left superior cerebellum and left superior insular cortex were not seen on the MRI yesterday. 2. No change in small areas of acute infarct in the left frontal white matter and left parietal cortex. 3. Advanced chronic microvascular ischemia with multiple areas of intracranial chronic hemorrhage.  Transthoracic Echocardiogram - Left ventricle: The cavity size was normal. Systolic function wasnormal. The estimated ejection fraction was in the range of 50%to 55%. Wall motion was normal; there were no regional wallmotion abnormalities. - Aortic valve: There was mild to moderate regurgitation.Regurgitation pressure half-time: 500 ms. - Mitral valve: There was mild regurgitation. - Left atrium: The atrium was moderately dilated. -  Right ventricle: Systolic function was mildly reduced. - Right atrium:  The atrium was moderately dilated. - Atrial septum: No defect or patent foramen ovale was identified. - Tricuspid valve: There was mild regurgitation. - Pulmonic valve: There was trivial regurgitation. Pulmonary arteries: Systolic pressure was moderately increased. PA peak pressure: 44 mm Hg (S). Impressions: No cardiac source of emboli was indentified.  ASSESSMENT: Kayla Wilson is a 77 y.o. year old female here with embolic small left cerebellar and small left insular infarcts on 11/04/2017 secondary to known AF not on [facility] due to prior ICH. Vascular risk factors include AF, prior infarcts, HDL and HTN.  Patient is being seen today for hospital follow-up and overall has continued to be stable without residual deficits or recurring of symptoms.    PLAN: -Continue Eliquis (apixaban) daily  and Lipitor for secondary stroke prevention -Highly recommended to schedule appointment with Dr. Rayann Heman for AF and Eliquis management -F/u with PCP regarding your HLD and HTN management -Encouraged to increase activity due to generalized feeling of weakness and joint pain along with maintaining a healthy diet -Long long discussion with patient and daughter regarding importance of Eliquis for atrial fibrillation and secondary stroke prevention. -continue to monitor BP at home -Maintain strict control of hypertension with blood pressure goal below 130/90, diabetes with hemoglobin A1c goal below 6.5% and cholesterol with LDL cholesterol (bad cholesterol) goal below 70 mg/dL. I also advised the patient to eat a healthy diet with plenty of whole grains, cereals, fruits and vegetables, exercise regularly and maintain ideal body weight.  Follow up in 3 months or call earlier if needed   Greater than 50% of time during this 25 minute visit was spent on counseling,explanation of diagnosis of left cerebellar and left insular infarcts, reviewing  risk factor management of PAF, HTN and HLD, planning of further management, discussion with patient and family and coordination of care    Venancio Poisson, AGNP-BC  [facility] [address] [place], [place] [address]  Phone [phone] Fax [phone] Note: This document was prepared with digital dictation and possible smart phrase technology. Any transcriptional errors that result from this process are unintentional.

## 2018-01-09 NOTE — Progress Notes (Signed)
I agree with the above plan 

## 2018-01-23 ENCOUNTER — Other Ambulatory Visit: Payer: Self-pay

## 2018-01-26 ENCOUNTER — Other Ambulatory Visit: Payer: Self-pay

## 2018-01-26 NOTE — Telephone Encounter (Signed)
This encounter was created in error - please disregard.

## 2018-01-27 DIAGNOSIS — L821 Other seborrheic keratosis: Secondary | ICD-10-CM | POA: Diagnosis not present

## 2018-01-27 DIAGNOSIS — D485 Neoplasm of uncertain behavior of skin: Secondary | ICD-10-CM | POA: Diagnosis not present

## 2018-01-27 DIAGNOSIS — L814 Other melanin hyperpigmentation: Secondary | ICD-10-CM | POA: Diagnosis not present

## 2018-01-27 DIAGNOSIS — C44319 Basal cell carcinoma of skin of other parts of face: Secondary | ICD-10-CM | POA: Diagnosis not present

## 2018-02-02 ENCOUNTER — Telehealth: Payer: Self-pay | Admitting: Adult Health

## 2018-02-02 MED ORDER — APIXABAN 5 MG PO TABS: 5.0000 mg | ORAL_TABLET | Freq: Two times a day (BID) | ORAL | 0 refills | Status: DC

## 2018-02-02 NOTE — Telephone Encounter (Signed)
Ok to refill for 90 days but she really needs to make an appointment with Dr. Rayann Heman for management of A fib and Eliquis

## 2018-02-02 NOTE — Telephone Encounter (Signed)
Copied from [place] [id number]. Topic: Quick Communication - See Telephone Encounter >> Feb 02, 2018 10:01 AM Ahmed Prima L wrote: CRM for notification. See Telephone encounter for: 02/02/18.  Patient states the hospital prescribed this for her and wanted to know if Tommi Rumps could refill this. She said she does not need it for another couple weeks and state she can not just stop taking it  apixaban (ELIQUIS) 5 MG TABS tablet  [place], [place] [address] [place]  [id number]

## 2018-02-02 NOTE — Telephone Encounter (Signed)
Sent to the pharmacy by e-scribe for 90 days.  Pt notified rx sent in and instructed to make follow up appt with Dr. Rayann Heman.  No further action required.

## 2018-02-02 NOTE — Telephone Encounter (Signed)
Okay to refill? 

## 2018-02-07 ENCOUNTER — Other Ambulatory Visit: Payer: Self-pay

## 2018-02-07 NOTE — Patient Outreach (Signed)
3 outreach attempts were completed to obtain mRs. mRs could not be obtained because patient never returned my calls. MRs=7

## 2018-02-08 NOTE — Telephone Encounter (Signed)
[place] called and spoke to [place], [facility] about Eliquis. He says the patient picked up a 30 day supply on 01/20/18 and the one received on 02/02/18 #180/0 refill is on hold. He says the patient can receive it close to 01/20/18. I called the patient and advised of the above, she verbalized understanding.

## 2018-02-08 NOTE — Telephone Encounter (Signed)
Patient needs this script resent to the pharmacy because they said they don't have the request on file at the pharmacy. This was originally sent, 90 day supply on 01/31/2018.

## 2018-03-16 ENCOUNTER — Other Ambulatory Visit: Payer: Self-pay | Admitting: Adult Health

## 2018-03-16 DIAGNOSIS — I10 Essential (primary) hypertension: Secondary | ICD-10-CM

## 2018-03-16 DIAGNOSIS — I4821 Permanent atrial fibrillation: Secondary | ICD-10-CM

## 2018-04-11 ENCOUNTER — Ambulatory Visit: Payer: Self-pay | Admitting: Adult Health

## 2018-04-25 ENCOUNTER — Encounter (HOSPITAL_COMMUNITY): Payer: Self-pay | Admitting: Nurse Practitioner

## 2018-04-25 ENCOUNTER — Ambulatory Visit (HOSPITAL_COMMUNITY)
Admission: RE | Admit: 2018-04-25 | Discharge: 2018-04-25 | Disposition: A | Discharge status: Home / Self Care | Payer: Medicare Other | Source: Ambulatory Visit | Attending: Nurse Practitioner | Admitting: Nurse Practitioner

## 2018-04-25 VITALS — BP 142/84 | HR 95 | Ht 68.0 in | Wt 125.0 lb

## 2018-04-25 DIAGNOSIS — Z8673 Personal history of transient ischemic attack (TIA), and cerebral infarction without residual deficits: Secondary | ICD-10-CM | POA: Insufficient documentation

## 2018-04-25 DIAGNOSIS — Z7901 Long term (current) use of anticoagulants: Secondary | ICD-10-CM | POA: Insufficient documentation

## 2018-04-25 DIAGNOSIS — Z8249 Family history of ischemic heart disease and other diseases of the circulatory system: Secondary | ICD-10-CM | POA: Diagnosis not present

## 2018-04-25 DIAGNOSIS — I4821 Permanent atrial fibrillation: Secondary | ICD-10-CM

## 2018-04-25 DIAGNOSIS — Z888 Allergy status to other drugs, medicaments and biological substances status: Secondary | ICD-10-CM | POA: Diagnosis not present

## 2018-04-25 DIAGNOSIS — Z79899 Other long term (current) drug therapy: Secondary | ICD-10-CM | POA: Insufficient documentation

## 2018-04-25 DIAGNOSIS — I10 Essential (primary) hypertension: Secondary | ICD-10-CM | POA: Insufficient documentation

## 2018-04-25 DIAGNOSIS — Z882 Allergy status to sulfonamides status: Secondary | ICD-10-CM | POA: Insufficient documentation

## 2018-04-25 DIAGNOSIS — I482 Chronic atrial fibrillation, unspecified: Secondary | ICD-10-CM | POA: Insufficient documentation

## 2018-04-25 MED ORDER — APIXABAN 5 MG PO TABS: 5.0000 mg | ORAL_TABLET | Freq: Two times a day (BID) | ORAL | 1 refills | Status: DC

## 2018-04-25 NOTE — Progress Notes (Signed)
Patient ID: Kayla Wilson, female   DOB: [birth year], 78 y.o.   MRN: [id number]     Primary Care Physician: Kayla Peng, NP Referring Physician: Dr. Laverda Page is a 78 y.o. female with a h/o chronic afib for f/u. She was seen last one year ago and was very clear that she did not want to take anticoagulation. Unfortunately, she had a stroke in August of 2019 and finally consented to start taking eliquis. She is expecting to have Moh's procedure the last of the month and is asking what to do with her anticoagulation. Afib is rate controlled.  Today, she denies symptoms of palpitations, chest pain, shortness of breath, orthopnea, PND, lower extremity edema, dizziness, presyncope, syncope, or neurologic sequela. The patient is tolerating medications without difficulties and is otherwise without complaint today.   Past Medical History:  Diagnosis Date  . Atrial fibrillation ([facility])   . Colon polyps   . Complication of anesthesia    difficulty awakening  . CVA (cerebral vascular accident) ([place]) 11/04/2017  . Diverticulosis   . Hypertension   . IATROGENIC CEREBROVASCULAR INFARCT/HEMORRHAGE NE 03/22/1993   Qualifier: Diagnosis of  By: Leanne Chang MD, [place] per pt's report-Dr Vertell Limber- hemorrhagic stroke. No intervention required    . Stroke [facility])    Past Surgical History:  Procedure Laterality Date  . APPENDECTOMY    . EYE SURGERY Left    vitrectomy  . FALSE ANEURYSM REPAIR Right 12/03/2017   Procedure: REPAIR OF RIGHT FEMORAL FALSE ANEURYSM;  Surgeon: Rosetta Posner, MD;  Location: [facility];  Service: Vascular;  Laterality: Right;  . IR ANGIOGRAM EXTREMITY LEFT  11/05/2017  . IR ARCH CERVICICEBRAL NON-SEL (MS)  11/05/2017  . RADIOLOGY WITH ANESTHESIA N/A 11/05/2017   Procedure: RADIOLOGY WITH ANESTHESIA;  Surgeon: Luanne Bras, MD;  Location: [facility];  Service: Radiology;  Laterality: N/A;  . TOTAL HIP ARTHROPLASTY Right 09/03/2014   Procedure: RIGHT TOTAL HIP ARTHROPLASTY ANTERIOR  APPROACH;  Surgeon: Renette Butters, MD;  Location: [facility];  Service: Orthopedics;  Laterality: Right;  . TUBAL LIGATION      Current Outpatient Medications  Medication Sig Dispense Refill  . apixaban (ELIQUIS) 5 MG TABS tablet Take 1 tablet (5 mg total) by mouth 2 (two) times daily. 180 tablet 1  . atorvastatin (LIPITOR) 40 MG tablet Take 1 tablet (40 mg total) by mouth daily at 6 PM. 30 tablet 2  . carvedilol (COREG) 12.5 MG tablet TAKE 1 TABLET BY MOUTH TWICE DAILY 180 tablet 0  . hydrochlorothiazide (HYDRODIURIL) 25 MG tablet Take 1 tablet (25 mg total) by mouth daily. 90 tablet 1  . vitamin B-12 (CYANOCOBALAMIN) 500 MCG tablet Take 500 mcg by mouth daily.     No current facility-administered medications for this encounter.     Allergies  Allergen Reactions  . Ace Inhibitors Anaphylaxis    Angioedema, perioperative  . Celebrex [Celecoxib] Itching and Other (See Comments)    Twitching   . Antihistamines, Chlorpheniramine-Type Other (See Comments)    A-fib  . Aspirin Other (See Comments)    History of hemorrhagic stroke  . Cortisone Other (See Comments)    "cortisone shot made me jerk"  . Lidocaine Other (See Comments)    Pt was fine for ~2 days after she received medication and states she then got "sore all over and could not move"  . Phenytoin Swelling    Swelling mostly in arms  . Sulfonamide Derivatives Itching    Social  History   Socioeconomic History  . Marital status: Married    Spouse name: Not on file  . Number of children: Not on file  . Years of education: Not on file  . Highest education level: Not on file  Occupational History  . Not on file  Social Needs  . Financial resource strain: Not on file  . Food insecurity:    Worry: Not on file    Inability: Not on file  . Transportation needs:    Medical: Not on file    Non-medical: Not on file  Tobacco Use  . Smoking status: Never Smoker  . Smokeless tobacco: Never Used  Substance and Sexual Activity    . Alcohol use: No  . Drug use: No  . Sexual activity: Not Currently  Lifestyle  . Physical activity:    Days per week: Not on file    Minutes per session: Not on file  . Stress: Not on file  Relationships  . Social connections:    Talks on phone: Not on file    Gets together: Not on file    Attends religious service: Not on file    Active member of club or organization: Not on file    Attends meetings of clubs or organizations: Not on file    Relationship status: Not on file  . Intimate partner violence:    Fear of current or ex partner: Not on file    Emotionally abused: Not on file    Physically abused: Not on file    Forced sexual activity: Not on file  Other Topics Concern  . Not on file  Social History Narrative   Married 23 years    - Seven children 6 girls and one boy.    - three children live in [place] in [place].       11 grand children and 7 great grandchildren      Worked at [employer] for four years and then started having children       Diet: Fast food and cooks at home      Likes to [employer], flowers and working in the yard, Firefighter.           Family History  Problem Relation Age of Onset  . Diabetes Brother   . Heart disease Brother   . Hypertension Brother   . Stroke Mother   . Hypertension Mother   . Heart disease Father        CABG, MI  . Cancer Father        skin    ROS- All systems are reviewed and negative except as per the HPI above  Physical Exam: Vitals:   04/25/18 1029  BP: (!) 142/84  Pulse: 95  Weight: 56.7 kg  Height: 5\' 8"  (1.727 m)    GEN- The patient is well appearing, alert and oriented x 3 today.   Head- normocephalic, atraumatic Eyes-  Sclera clear, conjunctiva pink Ears- hearing intact Oropharynx- clear Neck- supple, no JVP Lymph- no cervical lymphadenopathy Lungs- Clear to ausculation bilaterally, normal work of breathing Heart- Irregular rate and rhythm, no  murmurs, rubs or gallops, PMI not laterally displaced GI- soft, NT, ND, + BS Extremities- no clubbing, cyanosis, or edema MS- no significant deformity or atrophy Skin- no rash or lesion Psych- euthymic mood, full affect Neuro- strength and sensation are intact  EKG-afib at 95 bpm, qrs int 84 ms, qtc 475 sec Epic  records reviewed  Assessment and Plan: 1. Afib Chronic/asymptomatic and rate controlled Continue coreg 12.5 mg bid  2. Chadsvasc of at least 6 Continue eliquis 5 mg bid She will have to question dermatologist as to his comfort re Moh's procedure and anticoagulation, she will let us know if anticoagulation is asked to be stopped If she is asked to stop DOAC , with previous stroke, she may have to use Lovenox while  off anticoagulation   F/u here in 6 months  Butch Penny C. Dachelle Molzahn, [facility] [address] [place], [place] [address] [phone]

## 2018-05-10 DIAGNOSIS — C44311 Basal cell carcinoma of skin of nose: Secondary | ICD-10-CM | POA: Diagnosis not present

## 2018-06-11 ENCOUNTER — Other Ambulatory Visit: Payer: Self-pay | Admitting: Adult Health

## 2018-06-11 DIAGNOSIS — I4821 Permanent atrial fibrillation: Secondary | ICD-10-CM

## 2018-06-11 DIAGNOSIS — I10 Essential (primary) hypertension: Secondary | ICD-10-CM

## 2018-06-14 ENCOUNTER — Other Ambulatory Visit: Payer: Self-pay | Admitting: Adult Health

## 2018-06-14 DIAGNOSIS — I10 Essential (primary) hypertension: Secondary | ICD-10-CM

## 2018-06-14 DIAGNOSIS — I4821 Permanent atrial fibrillation: Secondary | ICD-10-CM

## 2018-06-15 NOTE — Telephone Encounter (Signed)
Filled on 06/13/2018.

## 2018-06-16 ENCOUNTER — Other Ambulatory Visit: Payer: Self-pay | Admitting: Adult Health

## 2018-06-16 DIAGNOSIS — I10 Essential (primary) hypertension: Secondary | ICD-10-CM

## 2018-06-16 DIAGNOSIS — I4821 Permanent atrial fibrillation: Secondary | ICD-10-CM

## 2018-06-19 ENCOUNTER — Telehealth: Payer: Self-pay | Admitting: Adult Health

## 2018-06-19 ENCOUNTER — Other Ambulatory Visit: Payer: Self-pay | Admitting: Adult Health

## 2018-06-19 DIAGNOSIS — I10 Essential (primary) hypertension: Secondary | ICD-10-CM

## 2018-06-19 DIAGNOSIS — I4821 Permanent atrial fibrillation: Secondary | ICD-10-CM

## 2018-06-19 MED ORDER — CARVEDILOL 12.5 MG PO TABS: ORAL_TABLET | ORAL | 0 refills | Status: DC

## 2018-06-19 NOTE — Telephone Encounter (Signed)
Returned call to pt after receiving voicemail message on 06/19/18 at 8:53 am. Pt called to request a refill of Carvedilol 12.5mg  tab. Pt states she did contact the pharmacy previously and was told that the prescription had not been received even though the prescription was noted to be sent and receipt confimed to preferred pharmacy on 06/13/18. Medication refill resent to [facility] in [place].

## 2018-06-19 NOTE — Telephone Encounter (Signed)
Carvedilol refilled [facility] says they did not have rx on file.

## 2018-06-19 NOTE — Addendum Note (Signed)
Addended by: Dimple Nanas on: 06/19/2018 02:52 PM   Modules accepted: Orders

## 2018-06-19 NOTE — Telephone Encounter (Signed)
Denied.  Filled on 06/13/2018 for 90 days.  Messae sent to the pharmacy to check file.

## 2018-06-20 ENCOUNTER — Other Ambulatory Visit (HOSPITAL_COMMUNITY): Payer: Self-pay | Admitting: *Deleted

## 2018-08-15 ENCOUNTER — Other Ambulatory Visit: Payer: Self-pay | Admitting: Adult Health

## 2018-08-15 DIAGNOSIS — I10 Essential (primary) hypertension: Secondary | ICD-10-CM

## 2018-08-20 ENCOUNTER — Other Ambulatory Visit: Payer: Self-pay

## 2018-08-20 ENCOUNTER — Encounter (HOSPITAL_COMMUNITY): Payer: Self-pay | Admitting: Emergency Medicine

## 2018-08-20 ENCOUNTER — Emergency Department (HOSPITAL_COMMUNITY)
Admission: EM | Admit: 2018-08-20 | Discharge: 2018-08-20 | Disposition: A | Discharge status: Home / Self Care | Payer: Medicare Other | Attending: Emergency Medicine | Admitting: Emergency Medicine

## 2018-08-20 DIAGNOSIS — Z96641 Presence of right artificial hip joint: Secondary | ICD-10-CM | POA: Diagnosis not present

## 2018-08-20 DIAGNOSIS — Z7901 Long term (current) use of anticoagulants: Secondary | ICD-10-CM | POA: Insufficient documentation

## 2018-08-20 DIAGNOSIS — N390 Urinary tract infection, site not specified: Secondary | ICD-10-CM | POA: Diagnosis not present

## 2018-08-20 DIAGNOSIS — R410 Disorientation, unspecified: Secondary | ICD-10-CM

## 2018-08-20 DIAGNOSIS — E871 Hypo-osmolality and hyponatremia: Secondary | ICD-10-CM | POA: Diagnosis not present

## 2018-08-20 DIAGNOSIS — Z8673 Personal history of transient ischemic attack (TIA), and cerebral infarction without residual deficits: Secondary | ICD-10-CM | POA: Diagnosis not present

## 2018-08-20 DIAGNOSIS — R4182 Altered mental status, unspecified: Secondary | ICD-10-CM | POA: Diagnosis present

## 2018-08-20 DIAGNOSIS — I10 Essential (primary) hypertension: Secondary | ICD-10-CM | POA: Insufficient documentation

## 2018-08-20 DIAGNOSIS — R569 Unspecified convulsions: Secondary | ICD-10-CM | POA: Diagnosis not present

## 2018-08-20 DIAGNOSIS — Z79899 Other long term (current) drug therapy: Secondary | ICD-10-CM | POA: Diagnosis not present

## 2018-08-20 DIAGNOSIS — R000 Tachycardia, unspecified: Secondary | ICD-10-CM | POA: Diagnosis not present

## 2018-08-20 DIAGNOSIS — R51 Headache: Secondary | ICD-10-CM | POA: Diagnosis not present

## 2018-08-20 DIAGNOSIS — R109 Unspecified abdominal pain: Secondary | ICD-10-CM | POA: Diagnosis not present

## 2018-08-20 LAB — COMPREHENSIVE METABOLIC PANEL
ALT: 16 U/L (ref 0–44)
AST: 31 U/L (ref 15–41)
Albumin: 3.7 g/dL (ref 3.5–5.0)
Alkaline Phosphatase: 48 U/L (ref 38–126)
Anion gap: 16 — ABNORMAL HIGH (ref 5–15)
BUN: 5 mg/dL — ABNORMAL LOW (ref 8–23)
CO2: 26 mmol/L (ref 22–32)
Calcium: 9.4 mg/dL (ref 8.9–10.3)
Chloride: 82 mmol/L — ABNORMAL LOW (ref 98–111)
Creatinine, Ser: 0.68 mg/dL (ref 0.44–1.00)
GFR calc Af Amer: >60 mL/min (ref >60)
GFR calc non Af Amer: >60 mL/min (ref >60)
Glucose, Bld: 113 mg/dL — ABNORMAL HIGH (ref 70–99)
Potassium: 3 mmol/L — ABNORMAL LOW (ref 3.5–5.1)
Sodium: 124 mmol/L — ABNORMAL LOW (ref 135–145)
Total Bilirubin: 1.9 mg/dL — ABNORMAL HIGH (ref 0.3–1.2)
Total Protein: 7.6 g/dL (ref 6.5–8.1)

## 2018-08-20 LAB — URINALYSIS, ROUTINE W REFLEX MICROSCOPIC
Bilirubin Urine: NEGATIVE
Glucose, UA: NEGATIVE mg/dL
Ketones, ur: 5 mg/dL — AB
Nitrite: NEGATIVE
Protein, ur: NEGATIVE mg/dL
Specific Gravity, Urine: 1.016 (ref 1.005–1.030)
pH: 7 (ref 5.0–8.0)

## 2018-08-20 LAB — CBC WITH DIFFERENTIAL/PLATELET
Abs Immature Granulocytes: 0.02 10*3/uL (ref 0.00–0.07)
Basophils Absolute: 0 10*3/uL (ref 0.0–0.1)
Basophils Relative: 0 %
Eosinophils Absolute: 0 10*3/uL (ref 0.0–0.5)
Eosinophils Relative: 0 %
HCT: 40.1 % (ref 36.0–46.0)
Hemoglobin: 13.9 g/dL (ref 12.0–15.0)
Immature Granulocytes: 0 %
Lymphocytes Relative: 29 %
Lymphs Abs: 2.2 10*3/uL (ref 0.7–4.0)
MCH: 29.3 pg (ref 26.0–34.0)
MCHC: 34.7 g/dL (ref 30.0–36.0)
MCV: 84.6 fL (ref 80.0–100.0)
Monocytes Absolute: 0.7 10*3/uL (ref 0.1–1.0)
Monocytes Relative: 9 %
Neutro Abs: 4.7 10*3/uL (ref 1.7–7.7)
Neutrophils Relative %: 62 %
Platelets: 203 10*3/uL (ref 150–400)
RBC: 4.74 MIL/uL (ref 3.87–5.11)
RDW: 12.6 % (ref 11.5–15.5)
WBC: 7.7 10*3/uL (ref 4.0–10.5)
nRBC: 0 % (ref 0.0–0.2)

## 2018-08-20 LAB — LIPASE, BLOOD: Lipase: 26 U/L (ref 11–51)

## 2018-08-20 MED ORDER — CEPHALEXIN 500 MG PO CAPS: 500.0000 mg | ORAL_CAPSULE | Freq: Two times a day (BID) | ORAL | 0 refills | Status: AC

## 2018-08-20 MED ORDER — POTASSIUM CHLORIDE 10 MEQ/100ML IV SOLN
10.0000 meq | Freq: Once | INTRAVENOUS | Status: AC
  Administered 2018-08-20: 10 meq via INTRAVENOUS
  Filled 2018-08-20: qty 100

## 2018-08-20 MED ORDER — POTASSIUM CHLORIDE CRYS ER 20 MEQ PO TBCR
40.0000 meq | EXTENDED_RELEASE_TABLET | Freq: Once | ORAL | Status: AC
  Administered 2018-08-20: 40 meq via ORAL
  Filled 2018-08-20: qty 2

## 2018-08-20 MED ORDER — SODIUM CHLORIDE 0.9 % IV BOLUS
500.0000 mL | Freq: Once | INTRAVENOUS | Status: AC
  Administered 2018-08-20: 500 mL via INTRAVENOUS

## 2018-08-20 MED ORDER — SODIUM CHLORIDE 0.9 % IV SOLN
1.0000 g | Freq: Once | INTRAVENOUS | Status: AC
  Administered 2018-08-20: 1 g via INTRAVENOUS
  Filled 2018-08-20: qty 10

## 2018-08-20 NOTE — ED Provider Notes (Signed)
[facility] EMERGENCY DEPARTMENT Provider Note   CSN: [id number] Arrival date & time: 08/20/18  1528    History   Chief Complaint Chief Complaint  Patient presents with  . Altered Mental Status    HPI [name] is a 78 y.o. female w/ a pmhx of afib, HTN, prior CVA (1995) who presents with "bladder pain" for 2 days. States she has had pain where her bladder is every time she pees.  The pain does not radiate anywhere.  Denies any fevers, nausea, vomiting, diarrhea, constipation, hematuria. States he family says she has been acting off for the past couple days. Has not tried anything for the pain. Nothing makes it better or worse.    HPI  Past Medical History:  Diagnosis Date  . Atrial fibrillation ([place])   . Colon polyps   . Complication of anesthesia    difficulty awakening  . CVA (cerebral vascular accident) ([place]) 11/04/2017  . Diverticulosis   . Hypertension   . IATROGENIC CEREBROVASCULAR INFARCT/HEMORRHAGE NE 03/22/1993   Qualifier: Diagnosis of  By: Leanne Chang MD, [place] per pt's report-Dr Vertell Limber- hemorrhagic stroke. No intervention required    . Stroke [facility])     Patient Active Problem List   Diagnosis Date Noted  . Avulsion, finger tip, subsequent encounter 12/03/2017  . S/P aneurysm repair 12/03/2017  . Hyperlipidemia 11/07/2017  . Hx of hemorrhagic stroke 11/07/2017  . UTI (urinary tract infection) 11/07/2017  . Hypokalemia 11/07/2017  . Thyroid nodule 11/07/2017  . Lung density on x-ray 11/07/2017  . 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 CVA (cerebral vascular accident) ([place]) - small embolic infarcts d/t AF 53/61/4431  . 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 DJD (degenerative joint disease) 09/03/2014  . Angioedema   . Urticaria   . Allergic reaction to serum, anaphylactic shock   . Chronic systolic dysfunction of left ventricle 11/14/2013  . Atrial fibrillation ([place]) 09/26/2013  . UNSPECIFIED VENOUS INSUFFICIENCY 11/21/2008  .  PSORIASIS 02/16/2008  . 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 DIVERTICULOSIS, COLON 01/12/2007  . COLONIC POLYPS, HX OF 01/12/2007  . Essential hypertension 11/15/2006    Past Surgical History:  Procedure Laterality Date  . APPENDECTOMY    . EYE SURGERY Left    vitrectomy  . FALSE ANEURYSM REPAIR Right 12/03/2017   Procedure: REPAIR OF RIGHT FEMORAL FALSE ANEURYSM;  Surgeon: Rosetta Posner, MD;  Location: [facility];  Service: Vascular;  Laterality: Right;  . IR ANGIOGRAM EXTREMITY LEFT  11/05/2017  . IR ARCH CERVICICEBRAL NON-SEL (MS)  11/05/2017  . RADIOLOGY WITH ANESTHESIA N/A 11/05/2017   Procedure: RADIOLOGY WITH ANESTHESIA;  Surgeon: Luanne Bras, MD;  Location: [facility];  Service: Radiology;  Laterality: N/A;  . TOTAL HIP ARTHROPLASTY Right 09/03/2014   Procedure: RIGHT TOTAL HIP ARTHROPLASTY ANTERIOR APPROACH;  Surgeon: Renette Butters, MD;  Location: [facility];  Service: Orthopedics;  Laterality: Right;  . TUBAL LIGATION       OB History   No obstetric history on file.      Home Medications    Prior to Admission medications   Medication Sig Start Date End Date Taking? Authorizing Provider  acetaminophen (TYLENOL) 500 MG tablet Take 500 mg by mouth every 6 (six) hours as needed for headache (pain).   Yes [provider]  apixaban (ELIQUIS) 5 MG TABS tablet Take 1 tablet (5 mg total) by mouth 2 (two) times daily. 04/25/18  Yes Sherran Needs, NP  carvedilol (COREG) 12.5 MG tablet TAKE 1 TABLET BY MOUTH TWICE DAILY Patient taking differently: Take 12.5 mg by mouth 2 (two) times a day.  06/19/18  Yes Nafziger, Tommi Rumps, NP  hydrochlorothiazide (HYDRODIURIL) 25 MG tablet Take 1 tablet by mouth once daily Patient taking differently: Take 25 mg by mouth daily.  08/18/18  Yes Nafziger, Tommi Rumps, NP  meclizine (ANTIVERT) 25 MG tablet Take 25 mg by mouth daily as needed for dizziness.   Yes [provider]  sodium chloride (OCEAN) 0.65 % SOLN nasal spray  Place 1 spray into both nostrils 4 (four) times daily as needed for congestion.   Yes [provider]  vitamin B-12 (CYANOCOBALAMIN) 100 MCG tablet Take 100 mcg by mouth daily.    Yes [provider]  atorvastatin (LIPITOR) 40 MG tablet Take 1 tablet (40 mg total) by mouth daily at 6 PM. Patient not taking: Reported on 08/20/2018 11/07/17   Donzetta Starch, NP  cephALEXin (KEFLEX) 500 MG capsule Take 1 capsule (500 mg total) by mouth 2 (two) times daily for 7 days. 08/20/18 08/27/18  Doneta Public, MD    Family History Family History  Problem Relation Age of Onset  . Diabetes Brother   . Heart disease Brother   . Hypertension Brother   . Stroke Mother   . Hypertension Mother   . Heart disease Father        CABG, MI  . Cancer Father        skin    Social History Social History   Tobacco Use  . Smoking status: Never Smoker  . Smokeless tobacco: Never Used  Substance Use Topics  . Alcohol use: No  . Drug use: No     Allergies   Ace inhibitors; Celebrex [celecoxib]; Antihistamines, chlorpheniramine-type; Aspirin; Cortisone; Lidocaine; Phenytoin; and Sulfonamide derivatives   Review of Systems Review of Systems  Constitutional: Negative for activity change, appetite change, chills, diaphoresis, fatigue and fever.  Respiratory: Negative for cough, chest tightness and shortness of breath.   Cardiovascular: Negative for chest pain, palpitations and leg swelling.  Gastrointestinal: Positive for abdominal pain ("bladder pain"). Negative for blood in stool, constipation, diarrhea, nausea and vomiting.  Genitourinary: Positive for dysuria. Negative for flank pain and hematuria.  Musculoskeletal: Negative for back pain.  Neurological: Positive for headaches ("yea a bit of a headache for a few days"). Negative for syncope.  All other systems reviewed and are negative.    Physical Exam Updated Vital Signs BP (!) 154/86   Pulse 88   Temp 98.6 F (37 C) (Oral)   Resp  16   SpO2 98%   Physical Exam Vitals signs and nursing note reviewed.  Constitutional:      General: She is not in acute distress.    Appearance: Normal appearance. She is well-developed. She is not ill-appearing.  HENT:     Head: Normocephalic and atraumatic.  Eyes:     Conjunctiva/sclera: Conjunctivae normal.  Neck:     Musculoskeletal: Neck supple.  Cardiovascular:     Rate and Rhythm: Normal rate and regular rhythm.     Heart sounds: No murmur.  Pulmonary:     Effort: Pulmonary effort is normal. No respiratory distress.     Breath sounds: Normal breath sounds.  Abdominal:     Palpations: Abdomen is soft.     Tenderness: There is abdominal tenderness (suprapubic).  Musculoskeletal:     Right lower leg: No edema.     Left lower leg: No edema.  Skin:    General: Skin is warm and dry.  Neurological:     Mental Status: She is alert.      ED Treatments / Results  Labs (all labs ordered are listed, but only abnormal results are displayed) Labs Reviewed  COMPREHENSIVE METABOLIC PANEL - Abnormal; Notable for the following components:      Result Value   Sodium 124 (*)    Potassium 3.0 (*)    Chloride 82 (*)    Glucose, Bld 113 (*)    BUN 5 (*)    Total Bilirubin 1.9 (*)    Anion gap 16 (*)    All other components within normal limits  URINALYSIS, ROUTINE W REFLEX MICROSCOPIC - Abnormal; Notable for the following components:   APPearance HAZY (*)    Hgb urine dipstick MODERATE (*)    Ketones, ur 5 (*)    Leukocytes,Ua MODERATE (*)    Bacteria, UA MANY (*)    All other components within normal limits  CBC WITH DIFFERENTIAL/PLATELET  LIPASE, BLOOD    EKG None  Radiology No results found.  Procedures Procedures (including critical care time)  Medications Ordered in ED Medications  potassium chloride 10 mEq in 100 mL IVPB (0 mEq Intravenous Stopped 08/20/18 1815)  potassium chloride SA (K-DUR) CR tablet 40 mEq (40 mEq Oral Given 08/20/18 1706)  sodium  chloride 0.9 % bolus 500 mL (0 mLs Intravenous Stopped 08/20/18 1821)  cefTRIAXone (ROCEPHIN) 1 g in sodium chloride 0.9 % 100 mL IVPB (0 g Intravenous Stopped 08/20/18 1846)     Initial Impression / Assessment and Plan / ED Course  I have reviewed the triage vital signs and the nursing notes.  Pertinent labs & imaging results that were available during my care of the patient were reviewed by me and considered in my medical decision making (see chart for details).  Clinical Course as of Aug 19 2148  Sun Aug 20, 2018  1815 Called and updated daughter Loree Fee about her UTI. Discussed trial of antibiotics at home vs admission for antibiotics given that she is intermittently confused. Daughter states her brother lives with patient and will be able to make sure she gets her abx and will be able to closely monitor her. At this time daughter would like patient to be discharged home with abx. Discussed return precautions with daughter who states understanding.   [KF]    Clinical Course User Index [KF] Doneta Public, MD       KATHALENE SPORER is a 78 y.o. female w/ a pmhx of afib, HTN, prior CVA (1995) who presents with "bladder pain" for 2 days. Family reports that she is also intermittently confused at home today.  On initial exam patient well appearing, not in acute distress. VSS. Physical exam as above. Patient is not septic.  Likely suffering from a UTI. Will obtain labs and UA.  No history of falls and no signs of trauma. Patient is also alert and mentating appropriately at this time. Will not obtain a head CT  CBC shows a normal white count and hemoglobin.  CMP shows hyponatremia of 124.  Her baseline is around 126.  Potassium is 3.  Potassium repleted with p.o. and IV in the ED.  Normal creatinine.  Lipase negative.  UA shows moderate leuks and many bacteria consistent with a UTI.  IV Rocephin given in ED.  Called and spoke with daughter about the results and had shared decision making in  a trial of abx at home  vs admission to the hospital. Patient lives with family. Daughter states they would prefer for patient to be discharged home with antibiotics.  Discussed return precautions with family including change in mental status or worsening confusion.  Family states understanding.  Final Clinical Impressions(s) / ED Diagnoses   Final diagnoses:  Lower urinary tract infectious disease  Confusion    ED Discharge Orders         Ordered    cephALEXin (KEFLEX) 500 MG capsule  2 times daily     08/20/18 1901           Doneta Public, MD 08/20/18 2150    Margette Fast, MD 08/21/18 [phone]

## 2018-08-20 NOTE — ED Notes (Signed)
Called and left message for dtr to pick up this pt. Tried son's number but there was no answer.

## 2018-08-20 NOTE — ED Notes (Signed)
Assisted pt in calling her daughter. [phone]

## 2018-08-20 NOTE — ED Notes (Signed)
Notified EDP of dtr concerns about mental status changes beginning yesterday morning.

## 2018-08-20 NOTE — ED Triage Notes (Signed)
Pt arrives via EMS from home with reports of AMS since Thursday. Endorses HA/N/V on thursday but none today. Hx of CVA with left side deficits that resolved.   Loree Fee (daughter)- [phone] Francee Piccolo (husband) [phone]

## 2018-08-20 NOTE — ED Notes (Signed)
Family should be here in a half hour.

## 2018-08-20 NOTE — ED Notes (Signed)
PureWick placed.

## 2018-08-23 ENCOUNTER — Other Ambulatory Visit: Payer: Self-pay | Admitting: Adult Health

## 2018-08-23 DIAGNOSIS — I4821 Permanent atrial fibrillation: Secondary | ICD-10-CM

## 2018-08-23 DIAGNOSIS — I10 Essential (primary) hypertension: Secondary | ICD-10-CM

## 2018-08-28 ENCOUNTER — Other Ambulatory Visit: Payer: Self-pay

## 2018-08-28 ENCOUNTER — Ambulatory Visit (INDEPENDENT_AMBULATORY_CARE_PROVIDER_SITE_OTHER): Payer: Medicare Other | Admitting: Family Medicine

## 2018-08-28 ENCOUNTER — Encounter: Payer: Self-pay | Admitting: Family Medicine

## 2018-08-28 ENCOUNTER — Telehealth: Payer: Self-pay | Admitting: Adult Health

## 2018-08-28 VITALS — BP 124/68 | HR 73 | Temp 97.8°F | Wt 127.1 lb

## 2018-08-28 DIAGNOSIS — E876 Hypokalemia: Secondary | ICD-10-CM

## 2018-08-28 DIAGNOSIS — N390 Urinary tract infection, site not specified: Secondary | ICD-10-CM

## 2018-08-28 LAB — POCT URINALYSIS DIPSTICK
Bilirubin, UA: NEGATIVE
Blood, UA: POSITIVE
Glucose, UA: NEGATIVE
Ketones, UA: NEGATIVE
Leukocytes, UA: NEGATIVE
Nitrite, UA: NEGATIVE
Protein, UA: NEGATIVE
Spec Grav, UA: 1.015 (ref 1.010–1.025)
Urobilinogen, UA: 0.2 E.U./dL
pH, UA: 7.5 (ref 5.0–8.0)

## 2018-08-28 MED ORDER — POTASSIUM CHLORIDE ER 10 MEQ PO TBCR: 10.0000 meq | EXTENDED_RELEASE_TABLET | Freq: Every day | ORAL | 0 refills | Status: DC

## 2018-08-28 NOTE — Progress Notes (Signed)
   Subjective:    Patient ID: Kayla Wilson, female    DOB: 08/18/1940, 78 y.o.   MRN: [id number]  HPI Here with her daughter to follow up an ER visit on 08-20-18 for lower abdominal burning and some mental status changes. The family noticed she was more confused that normal. No fever or nausea. A the ER her UA was positive, and she was given a shot of Rocephin and a 7 day course of Keflex. Lab work that day revealed a low potassium of 3.0.  Since then she has felt much better and the abdominal burning has stopped. Her daughter says her mental status is back to baseline also. She has averaged 2-3 UTI's a year for the past few years. She has night time incontinence and she wears adult diapers to bed. She says these are almost always wet the next morning.    Review of Systems  Constitutional: Negative.   Respiratory: Negative.   Cardiovascular: Negative.   Gastrointestinal: Negative.   Genitourinary: Negative for dysuria, flank pain, frequency, hematuria, pelvic pain and urgency.       Objective:   Physical Exam Constitutional:      Appearance: Normal appearance.     Comments: Walks with a cane   Cardiovascular:     Rate and Rhythm: Normal rate. Rhythm irregular.     Pulses: Normal pulses.     Heart sounds: Normal heart sounds.  Pulmonary:     Effort: Pulmonary effort is normal.     Breath sounds: Normal breath sounds.  Abdominal:     General: Abdomen is flat. Bowel sounds are normal. There is no distension.     Palpations: Abdomen is soft. There is no mass.     Tenderness: There is no abdominal tenderness. There is no guarding or rebound.     Hernia: No hernia is present.  Neurological:     General: No focal deficit present.     Mental Status: She is alert and oriented to person, place, and time.           Assessment & Plan:  Her UA today is clear. Her UTI seems to be resolved. I urged her to drink more water every day to try to prevent another UTI. She is started on  Klor-con 10 mEq daily and she can recheck this the next time she sees [place], her PCP.  Alysia Penna, MD

## 2018-08-28 NOTE — Telephone Encounter (Signed)
Please disregard pt moved to Dr.Fry's schedule

## 2018-08-28 NOTE — Telephone Encounter (Signed)
Patient needs an in office visit today for UTI frequency/dizziness/nauseaous/weak/low oxygen levels.  Approval needed from Dr Elease Hashimoto then she needs a call to schedule this appt.  [facility] has a spot blocked off so if you need to send this note back to me to schedule you can.

## 2018-10-10 ENCOUNTER — Ambulatory Visit: Payer: Medicare Other | Admitting: Adult Health

## 2018-10-25 ENCOUNTER — Ambulatory Visit (HOSPITAL_COMMUNITY)
Admission: RE | Admit: 2018-10-25 | Discharge: 2018-10-25 | Disposition: A | Discharge status: Home / Self Care | Payer: Medicare Other | Source: Ambulatory Visit | Attending: Nurse Practitioner | Admitting: Nurse Practitioner

## 2018-10-25 ENCOUNTER — Other Ambulatory Visit: Payer: Self-pay

## 2018-10-25 ENCOUNTER — Encounter (HOSPITAL_COMMUNITY): Payer: Self-pay | Admitting: Nurse Practitioner

## 2018-10-25 DIAGNOSIS — Z8673 Personal history of transient ischemic attack (TIA), and cerebral infarction without residual deficits: Secondary | ICD-10-CM | POA: Insufficient documentation

## 2018-10-25 DIAGNOSIS — Z823 Family history of stroke: Secondary | ICD-10-CM | POA: Insufficient documentation

## 2018-10-25 DIAGNOSIS — Z886 Allergy status to analgesic agent status: Secondary | ICD-10-CM | POA: Insufficient documentation

## 2018-10-25 DIAGNOSIS — Z79899 Other long term (current) drug therapy: Secondary | ICD-10-CM | POA: Diagnosis not present

## 2018-10-25 DIAGNOSIS — Z7901 Long term (current) use of anticoagulants: Secondary | ICD-10-CM | POA: Diagnosis not present

## 2018-10-25 DIAGNOSIS — I10 Essential (primary) hypertension: Secondary | ICD-10-CM | POA: Insufficient documentation

## 2018-10-25 DIAGNOSIS — Z8249 Family history of ischemic heart disease and other diseases of the circulatory system: Secondary | ICD-10-CM | POA: Diagnosis not present

## 2018-10-25 DIAGNOSIS — I4821 Permanent atrial fibrillation: Secondary | ICD-10-CM

## 2018-10-25 DIAGNOSIS — I482 Chronic atrial fibrillation, unspecified: Secondary | ICD-10-CM | POA: Insufficient documentation

## 2018-10-25 DIAGNOSIS — Z833 Family history of diabetes mellitus: Secondary | ICD-10-CM | POA: Insufficient documentation

## 2018-10-25 DIAGNOSIS — Z888 Allergy status to other drugs, medicaments and biological substances status: Secondary | ICD-10-CM | POA: Insufficient documentation

## 2018-10-25 DIAGNOSIS — Z96641 Presence of right artificial hip joint: Secondary | ICD-10-CM | POA: Insufficient documentation

## 2018-10-25 DIAGNOSIS — Z882 Allergy status to sulfonamides status: Secondary | ICD-10-CM | POA: Diagnosis not present

## 2018-10-25 MED ORDER — CARVEDILOL 12.5 MG PO TABS: 12.5000 mg | ORAL_TABLET | Freq: Two times a day (BID) | ORAL | 1 refills | Status: DC

## 2018-10-25 MED ORDER — APIXABAN 5 MG PO TABS: 5.0000 mg | ORAL_TABLET | Freq: Two times a day (BID) | ORAL | 1 refills | Status: DC

## 2018-10-25 NOTE — Progress Notes (Signed)
Patient ID: Kayla Wilson, female   DOB: Aug 31, 1940, 78 y.o.   MRN: [id number]     Primary Care Physician: Dorothyann Peng, NP Referring Physician: Dr. Laverda Page is a 78 y.o. female with a h/o chronic afib for f/u. She was seen last one year ago and was very clear that she did not want to take anticoagulation. Unfortunately, she had a stroke in August of 2019 and finally consented to start taking eliquis.  Afib is well rate controlled. Unfortunately, she lost her husband in June from  consequence of  CAD. She states that her BP is up today 2/2 to handling all the paper work associated with his death. No bleeding issues with anticoagulation.  Today, she denies symptoms of palpitations, chest pain, shortness of breath, orthopnea, PND, lower extremity edema, dizziness, presyncope, syncope, or neurologic sequela. The patient is tolerating medications without difficulties and is otherwise without complaint today.   Past Medical History:  Diagnosis Date  . Atrial fibrillation ([place])   . Colon polyps   . Complication of anesthesia    difficulty awakening  . CVA (cerebral vascular accident) ([place]) 11/04/2017  . Diverticulosis   . Hypertension   . 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 No intervention required    . Stroke [facility])    Past Surgical History:  Procedure Laterality Date  . APPENDECTOMY    . EYE SURGERY Left    vitrectomy  . FALSE ANEURYSM REPAIR Right 12/03/2017   Procedure: REPAIR OF RIGHT FEMORAL FALSE ANEURYSM;  Surgeon: Rosetta Posner, MD;  Location: [place];  Service: Vascular;  Laterality: Right;  . IR ANGIOGRAM EXTREMITY LEFT  11/05/2017  . IR ARCH CERVICICEBRAL NON-SEL (MS)  11/05/2017  . RADIOLOGY WITH ANESTHESIA N/A 11/05/2017   Procedure: RADIOLOGY WITH ANESTHESIA;  Surgeon: Luanne Bras, MD;  Location: [place];  Service: Radiology;  Laterality:  N/A;  . TOTAL HIP ARTHROPLASTY Right 09/03/2014   Procedure: RIGHT TOTAL HIP ARTHROPLASTY ANTERIOR APPROACH;  Surgeon: Renette Butters, MD;  Location: [facility];  Service: Orthopedics;  Laterality: Right;  . TUBAL LIGATION      Current Outpatient Medications  Medication Sig Dispense Refill  . acetaminophen (TYLENOL) 500 MG tablet Take 500 mg by mouth every 6 (six) hours as needed for headache (pain).    Marland Kitchen apixaban (ELIQUIS) 5 MG TABS tablet Take 1 tablet (5 mg total) by mouth 2 (two) times daily. 180 tablet 1  . carvedilol (COREG) 12.5 MG tablet Take 1 tablet (12.5 mg total) by mouth 2 (two) times daily. 180 tablet 1  . hydrochlorothiazide (HYDRODIURIL) 25 MG tablet Take 1 tablet by mouth once daily (Patient taking differently: Take 25 mg by mouth daily. ) 90 tablet 0  . sodium chloride (OCEAN) 0.65 % SOLN nasal spray Place 1 spray into both nostrils 4 (four) times daily as needed for congestion.    . vitamin B-12 (CYANOCOBALAMIN) 100 MCG tablet Take 100 mcg by mouth daily.      No current facility-administered medications for this encounter.     Allergies  Allergen Reactions  . Ace Inhibitors Anaphylaxis    Angioedema, perioperative  . Celebrex [Celecoxib] Itching and Other (See Comments)    Twitching   . Antihistamines, Chlorpheniramine-Type Other (See Comments)    A-fib  . Aspirin Other (See Comments)    History of hemorrhagic stroke  . Cortisone Other (See Comments)    "  cortisone shot made me jerk"  . Lidocaine Other (See Comments)    Pt was fine for ~2 days after she received medication and states she then got "sore all over and could not move"  . Phenytoin Swelling    Swelling mostly in arms  . Sulfonamide Derivatives Itching    Social History   Socioeconomic History  . Marital status: Married    Spouse name: Not on file  . Number of children: Not on file  . Years of education: Not on file  . Highest education level: Not on file  Occupational History  . Not on file   Social Needs  . Financial resource strain: Not on file  . Food insecurity    Worry: Not on file    Inability: Not on file  . Transportation needs    Medical: Not on file    Non-medical: Not on file  Tobacco Use  . Smoking status: Never Smoker  . Smokeless tobacco: Never Used  Substance and Sexual Activity  . Alcohol use: No  . Drug use: No  . Sexual activity: Not Currently  Lifestyle  . Physical activity    Days per week: Not on file    Minutes per session: Not on file  . Stress: Not on file  Relationships  . Social Herbalist on phone: Not on file    Gets together: Not on file    Attends religious service: Not on file    Active member of club or organization: Not on file    Attends meetings of clubs or organizations: Not on file    Relationship status: Not on file  . Intimate partner violence    Fear of current or ex partner: Not on file    Emotionally abused: Not on file    Physically abused: Not on file    Forced sexual activity: Not on file  Other Topics Concern  . Not on file  Social History Narrative   Married 87 years    - Seven children 6 girls and one boy.    - three children live in [place] live in [place]. Some in the mountains and some on the coast.       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, flowers and working in the yard, Firefighter.           Family History  Problem Relation Age of Onset  . Diabetes Brother   . Heart disease Brother   . Hypertension Brother   . Stroke Mother   . Hypertension Mother   . Heart disease Father        CABG, MI  . Cancer Father        skin    ROS- All systems are reviewed and negative except as per the HPI above  Physical Exam: Vitals:   10/25/18 1119  BP: (!) 172/96  Pulse: 64  Weight: 59.4 kg  Height: 5\' 8"  (1.727 m)    GEN- The patient is well  appearing, alert and oriented x 3 today.   Head- normocephalic, atraumatic Eyes-  Sclera clear, conjunctiva pink Ears- hearing intact Oropharynx- clear Neck- supple, no JVP Lymph- no cervical lymphadenopathy Lungs- Clear to ausculation bilaterally, normal work of breathing Heart- Irregular rate and rhythm, no  murmurs, rubs or gallops, PMI not laterally displaced GI- soft, NT, ND, + BS Extremities- no clubbing, cyanosis, or edema MS- no significant deformity or atrophy Skin- no rash or lesion Psych- euthymic mood, full affect Neuro- strength and sensation are intact  EKG-afib at 64  bpm, qrs int 90 ms, qtc 420  sec Epic records reviewed  Assessment and Plan: 1. Afib Chronic/asymptomatic and rate controlled Continue coreg 12.5 mg bid  2. Chadsvasc of at least 6 Continue eliquis 5 mg bid  Recent CBC showed creatinine at 0.68 ms so she is still appropriately dosed on eliquis  3. HTN Elevated today Pt states at home better managed  Does not want meds adjusted    F/u here in 6 months  Butch Penny C. Deloma Spindle, [facility] [address] [place], [place] [address] [phone]

## 2018-11-10 ENCOUNTER — Other Ambulatory Visit: Payer: Self-pay | Admitting: Adult Health

## 2018-11-10 DIAGNOSIS — I10 Essential (primary) hypertension: Secondary | ICD-10-CM

## 2018-11-14 ENCOUNTER — Encounter: Payer: Self-pay | Admitting: Family Medicine

## 2018-11-14 NOTE — Telephone Encounter (Signed)
Ok to fill for 90 days but needs to schedule her CPE

## 2018-11-14 NOTE — Telephone Encounter (Signed)
Sent to the pharmacy by e-scribe for 90 days.  Letter sent by MyChart.

## 2019-01-24 ENCOUNTER — Other Ambulatory Visit: Payer: Self-pay | Admitting: Adult Health

## 2019-01-24 DIAGNOSIS — I10 Essential (primary) hypertension: Secondary | ICD-10-CM

## 2019-01-24 NOTE — Telephone Encounter (Signed)
Denied.  Pt needs cpx and fasting lab work.

## 2019-04-16 ENCOUNTER — Other Ambulatory Visit: Payer: Self-pay | Admitting: Adult Health

## 2019-04-16 DIAGNOSIS — I10 Essential (primary) hypertension: Secondary | ICD-10-CM

## 2019-04-19 ENCOUNTER — Other Ambulatory Visit: Payer: Self-pay

## 2019-04-19 DIAGNOSIS — I10 Essential (primary) hypertension: Secondary | ICD-10-CM

## 2019-04-19 MED ORDER — HYDROCHLOROTHIAZIDE 25 MG PO TABS: 25.0000 mg | ORAL_TABLET | Freq: Every day | ORAL | 0 refills | Status: DC

## 2019-05-21 ENCOUNTER — Ambulatory Visit: Payer: Medicare Other | Attending: Internal Medicine

## 2019-05-21 DIAGNOSIS — Z23 Encounter for immunization: Secondary | ICD-10-CM

## 2019-05-21 NOTE — Progress Notes (Signed)
   Covid-19 Vaccination Clinic  Name:  Kayla Wilson    MRN: [id number] DOB: 12/26/1940  05/21/2019  Kayla Wilson was observed post Covid-19 immunization for 30 minutes based on pre-vaccination screening\ without incidence. She was provided with Vaccine Information Sheet and instruction to access the V-Safe system.   Kayla Wilson was instructed to call 911 with any severe reactions post vaccine: Marland Kitchen Difficulty breathing  . Swelling of your face and throat  . A fast heartbeat  . A bad rash all over your body  . Dizziness and weakness    Immunizations Administered    Name Date Dose VIS Date Route   Pfizer COVID-19 Vaccine 05/21/2019 10:41 AM 0.3 mL 03/02/2019 Intramuscular   Manufacturer: [employer]   Lot: [id number]   [place]: [id number]

## 2019-06-12 ENCOUNTER — Other Ambulatory Visit (HOSPITAL_COMMUNITY): Payer: Self-pay | Admitting: Nurse Practitioner

## 2019-06-19 ENCOUNTER — Ambulatory Visit: Payer: Medicare Other | Attending: Internal Medicine

## 2019-06-19 DIAGNOSIS — Z23 Encounter for immunization: Secondary | ICD-10-CM

## 2019-06-19 NOTE — Progress Notes (Signed)
   Covid-19 Vaccination Clinic  Name:  Kayla Wilson    MRN: [id number] DOB: [birth year]  06/19/2019  Kayla Wilson was observed post Covid-19 immunization for 30 minutes based on pre-vaccination screening without incident. She was provided with Vaccine Information Sheet and instruction to access the V-Safe system.   Kayla Wilson was instructed to call 911 with any severe reactions post vaccine: Marland Kitchen Difficulty breathing  . Swelling of face and throat  . A fast heartbeat  . A bad rash all over body  . Dizziness and weakness   Immunizations Administered    Name Date Dose VIS Date Route   Pfizer COVID-19 Vaccine 06/19/2019 11:30 AM 0.3 mL 03/02/2019 Intramuscular   Manufacturer: [employer]   Lot: [id number]   [place]: [id number]

## 2019-07-05 ENCOUNTER — Encounter (HOSPITAL_COMMUNITY): Payer: Self-pay | Admitting: Nurse Practitioner

## 2019-07-05 ENCOUNTER — Ambulatory Visit (HOSPITAL_COMMUNITY)
Admission: RE | Admit: 2019-07-05 | Discharge: 2019-07-05 | Disposition: A | Discharge status: Home / Self Care | Payer: Medicare Other | Source: Ambulatory Visit | Attending: Nurse Practitioner | Admitting: Nurse Practitioner

## 2019-07-05 ENCOUNTER — Other Ambulatory Visit: Payer: Self-pay

## 2019-07-05 VITALS — BP 176/82 | HR 86 | Ht 68.0 in | Wt 133.6 lb

## 2019-07-05 DIAGNOSIS — I482 Chronic atrial fibrillation, unspecified: Secondary | ICD-10-CM | POA: Insufficient documentation

## 2019-07-05 DIAGNOSIS — D6869 Other thrombophilia: Secondary | ICD-10-CM

## 2019-07-05 DIAGNOSIS — Z96641 Presence of right artificial hip joint: Secondary | ICD-10-CM | POA: Insufficient documentation

## 2019-07-05 DIAGNOSIS — I4821 Permanent atrial fibrillation: Secondary | ICD-10-CM

## 2019-07-05 DIAGNOSIS — Z833 Family history of diabetes mellitus: Secondary | ICD-10-CM | POA: Insufficient documentation

## 2019-07-05 DIAGNOSIS — Z7901 Long term (current) use of anticoagulants: Secondary | ICD-10-CM | POA: Insufficient documentation

## 2019-07-05 DIAGNOSIS — Z823 Family history of stroke: Secondary | ICD-10-CM | POA: Insufficient documentation

## 2019-07-05 DIAGNOSIS — Z8673 Personal history of transient ischemic attack (TIA), and cerebral infarction without residual deficits: Secondary | ICD-10-CM | POA: Insufficient documentation

## 2019-07-05 DIAGNOSIS — Z888 Allergy status to other drugs, medicaments and biological substances status: Secondary | ICD-10-CM | POA: Insufficient documentation

## 2019-07-05 DIAGNOSIS — Z882 Allergy status to sulfonamides status: Secondary | ICD-10-CM | POA: Diagnosis not present

## 2019-07-05 DIAGNOSIS — I10 Essential (primary) hypertension: Secondary | ICD-10-CM | POA: Diagnosis not present

## 2019-07-05 DIAGNOSIS — Z886 Allergy status to analgesic agent status: Secondary | ICD-10-CM | POA: Insufficient documentation

## 2019-07-05 DIAGNOSIS — Z79899 Other long term (current) drug therapy: Secondary | ICD-10-CM | POA: Diagnosis not present

## 2019-07-05 DIAGNOSIS — Z8249 Family history of ischemic heart disease and other diseases of the circulatory system: Secondary | ICD-10-CM | POA: Diagnosis not present

## 2019-07-05 MED ORDER — APIXABAN 5 MG PO TABS: 5.0000 mg | ORAL_TABLET | Freq: Two times a day (BID) | ORAL | 1 refills | Status: DC

## 2019-07-05 MED ORDER — CARVEDILOL 12.5 MG PO TABS: 12.5000 mg | ORAL_TABLET | Freq: Two times a day (BID) | ORAL | 1 refills | Status: DC

## 2019-07-05 NOTE — Progress Notes (Signed)
Patient ID: Kayla Wilson, female   DOB: 29-Oct-1940, 79 y.o.   MRN: [id number]     Primary Care Physician: Dorothyann Peng, NP Referring Physician: Dr. Laverda Page is a 79 y.o. female with a h/o chronic afib for f/u. When initally seen,  was very clear that she did not want to take anticoagulation. Unfortunately, she had a stroke in August of 2019 and finally consented to start taking eliquis.  Afib is well rate controlled. Unfortunately, she lost her husband in June 2020 from  consequence of  CAD. She states that her BP is up today as she did not take her BB, needing to pick up refill.   No bleeding issues with anticoagulation.[id number] score of at least 6.  Today, she denies symptoms of palpitations, chest pain, shortness of breath, orthopnea, PND, lower extremity edema, dizziness, presyncope, syncope, or neurologic sequela. The patient is tolerating medications without difficulties and is otherwise without complaint today.   Past Medical History:  Diagnosis Date  . Atrial fibrillation ([place])   . Colon polyps   . Complication of anesthesia    difficulty awakening  . CVA (cerebral vascular accident) ([place]) 11/04/2017  . Diverticulosis   . Hypertension   . 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 No intervention required    . Stroke [facility])    Past Surgical History:  Procedure Laterality Date  . APPENDECTOMY    . EYE SURGERY Left    vitrectomy  . FALSE ANEURYSM REPAIR Right 12/03/2017   Procedure: REPAIR OF RIGHT FEMORAL FALSE ANEURYSM;  Surgeon: Rosetta Posner, MD;  Location: [place];  Service: Vascular;  Laterality: Right;  . IR ANGIOGRAM EXTREMITY LEFT  11/05/2017  . IR ARCH CERVICICEBRAL NON-SEL (MS)  11/05/2017  . RADIOLOGY WITH ANESTHESIA N/A 11/05/2017   Procedure: RADIOLOGY WITH ANESTHESIA;  Surgeon: Luanne Bras, MD;  Location: [place];  Service:  Radiology;  Laterality: N/A;  . TOTAL HIP ARTHROPLASTY Right 09/03/2014   Procedure: RIGHT TOTAL HIP ARTHROPLASTY ANTERIOR APPROACH;  Surgeon: Renette Butters, MD;  Location: [place];  Service: Orthopedics;  Laterality: Right;  . TUBAL LIGATION      Current Outpatient Medications  Medication Sig Dispense Refill  . apixaban (ELIQUIS) 5 MG TABS tablet Take 1 tablet (5 mg total) by mouth 2 (two) times daily. appt required for refills [phone] 180 tablet 1  . carvedilol (COREG) 12.5 MG tablet Take 1 tablet (12.5 mg total) by mouth 2 (two) times daily. 180 tablet 1  . hydrochlorothiazide (HYDRODIURIL) 25 MG tablet Take 1 tablet (25 mg total) by mouth daily. 90 tablet 0  . vitamin B-12 (CYANOCOBALAMIN) 100 MCG tablet Take 100 mcg by mouth daily.      No current facility-administered medications for this encounter.    Allergies  Allergen Reactions  . Ace Inhibitors Anaphylaxis    Angioedema, perioperative  . Celebrex [Celecoxib] Itching and Other (See Comments)    Twitching   . Antihistamines, Chlorpheniramine-Type Other (See Comments)    A-fib  . Aspirin Other (See Comments)    History of hemorrhagic stroke  . Cortisone Other (See Comments)    "cortisone shot made me jerk"  . Lidocaine Other (See Comments)    Pt was fine for ~2 days after she received medication and states she then got "sore all over and could not move"  . Phenytoin Swelling    Swelling mostly  in arms  . Sulfonamide Derivatives Itching    Social History   Socioeconomic History  . Marital status: Married    Spouse name: Not on file  . Number of children: Not on file  . Years of education: Not on file  . Highest education level: Not on file  Occupational History  . Not on file  Tobacco Use  . Smoking status: Never Smoker  . Smokeless tobacco: Never Used  Substance and Sexual Activity  . Alcohol use: No  . Drug use: No  . Sexual activity: Not Currently  Other Topics Concern  . Not on file  Social  History Narrative   Married 39 years    - Seven children 6 girls and one boy.    - three children live in [place] live in [place]. Some in the mountains and some on the coast.       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, flowers and working in the yard, Firefighter.          Social Determinants of Health   Financial Resource Strain:   . Difficulty of Paying Living Expenses:   Food Insecurity:   . Worried About Charity fundraiser in the Last Year:   . Arboriculturist in the Last Year:   Transportation Needs:   . Film/video editor (Medical):   Marland Kitchen Lack of Transportation (Non-Medical):   Physical Activity:   . Days of Exercise per Week:   . Minutes of Exercise per Session:   Stress:   . Feeling of Stress :   Social Connections:   . Frequency of Communication with Friends and Family:   . Frequency of Social Gatherings with Friends and Family:   . Attends Religious Services:   . Active Member of Clubs or Organizations:   . Attends Archivist Meetings:   Marland Kitchen Marital Status:   Intimate Partner Violence:   . Fear of Current or Ex-Partner:   . Emotionally Abused:   Marland Kitchen Physically Abused:   . Sexually Abused:     Family History  Problem Relation Age of Onset  . Diabetes Brother   . Heart disease Brother   . Hypertension Brother   . Stroke Mother   . Hypertension Mother   . Heart disease Father        CABG, MI  . Cancer Father        skin    ROS- All systems are reviewed and negative except as per the HPI above  Physical Exam: Vitals:   07/05/19 1127  BP: (!) 176/82  Pulse: 86  Weight: 60.6 kg  Height: 5\' 8"  (1.727 m)    GEN- The patient is well appearing, alert and oriented x 3 today.   Head- normocephalic, atraumatic Eyes-  Sclera clear, conjunctiva pink Ears- hearing intact Oropharynx- clear Neck- supple, no  JVP Lymph- no cervical lymphadenopathy Lungs- Clear to ausculation bilaterally, normal work of breathing Heart- Irregular rate and rhythm, no murmurs, rubs or gallops, PMI not laterally displaced GI- soft, NT, ND, + BS Extremities- no clubbing, cyanosis, or edema MS- no significant deformity or atrophy Skin- no rash or lesion Psych- euthymic mood, full affect Neuro- strength and sensation are intact  EKG-afib at 86  bpm, qrs int 90  ms, qtc 457  sec Epic records reviewed  Assessment and Plan: 1. Afib Chronic/asymptomatic and rate controlled Continue coreg 12.5 mg bid( did not take this am)  2. Chadsvasc of at least 6 Continue eliquis 5 mg bid  Cbc/bmet today   3. HTN Elevated today Did not take her BB this am  Will pick up form drugstore after this visit   F/u here in 6 months  Butch Penny C. [place], [facility] [address] [place], [place] [address] [phone]

## 2019-07-19 ENCOUNTER — Other Ambulatory Visit: Payer: Self-pay | Admitting: Adult Health

## 2019-07-19 DIAGNOSIS — I10 Essential (primary) hypertension: Secondary | ICD-10-CM

## 2019-07-19 NOTE — Telephone Encounter (Signed)
Denied.  Pt needs visit before refills can be authorized.

## 2019-07-23 ENCOUNTER — Other Ambulatory Visit: Payer: Self-pay | Admitting: Adult Health

## 2019-07-23 DIAGNOSIS — I10 Essential (primary) hypertension: Secondary | ICD-10-CM

## 2019-07-25 NOTE — Telephone Encounter (Signed)
Denied  Needs appointment

## 2019-07-30 ENCOUNTER — Other Ambulatory Visit: Payer: Self-pay

## 2019-07-31 ENCOUNTER — Ambulatory Visit (INDEPENDENT_AMBULATORY_CARE_PROVIDER_SITE_OTHER): Payer: Medicare Other | Admitting: Adult Health

## 2019-07-31 ENCOUNTER — Encounter: Payer: Self-pay | Admitting: Adult Health

## 2019-07-31 DIAGNOSIS — I10 Essential (primary) hypertension: Secondary | ICD-10-CM

## 2019-07-31 MED ORDER — HYDROCHLOROTHIAZIDE 25 MG PO TABS: 25.0000 mg | ORAL_TABLET | Freq: Every day | ORAL | 0 refills | Status: DC

## 2019-07-31 NOTE — Patient Instructions (Signed)
Please schedule a physical on your way out   Stop by the lab and get some blood work done   I have sent in your HCTZ

## 2019-07-31 NOTE — Progress Notes (Signed)
Subjective:    Patient ID: Kayla Wilson, female    DOB: 1940-08-14, 79 y.o.   MRN: [id number]  HPI 79 year old female who  has a past medical history of Atrial fibrillation [facility]), Colon polyps, Complication of anesthesia, CVA (cerebral vascular accident) ([place]) (11/04/2017), Diverticulosis, Hypertension, IATROGENIC CEREBROVASCULAR INFARCT/HEMORRHAGE NE (03/22/1993), and Stroke ([place]).  She presents to the office today for medication refill. She is in need of HCTZ 25 mg for blood pressure control. She has not been seen in the office since 2019. She has been out of her HCTZ for the last week. She denies CP, SOB, dizziness, lightheaded, blurred vision or headaches    Review of Systems See HPI   Past Medical History:  Diagnosis Date  . Atrial fibrillation ([place])   . Colon polyps   . Complication of anesthesia    difficulty awakening  . CVA (cerebral vascular accident) ([place]) 11/04/2017  . Diverticulosis   . Hypertension   . 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 No intervention required    . Stroke [facility])     Social History   Socioeconomic History  . Marital status: Married    Spouse name: Not on file  . Number of children: Not on file  . Years of education: Not on file  . Highest education level: Not on file  Occupational History  . Not on file  Tobacco Use  . Smoking status: Never Smoker  . Smokeless tobacco: Never Used  Substance and Sexual Activity  . Alcohol use: No  . Drug use: No  . Sexual activity: Not Currently  Other Topics Concern  . Not on file  Social History Narrative   Married 30 years    - Seven children 6 girls and one boy.    - three children live in [place] live in [place]. Some in the mountains and some on the coast.       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, flowers and working in the yard, Firefighter.          Social Determinants of Health   Financial Resource Strain:   . Difficulty of Paying Living Expenses:   Food Insecurity:   . Worried About Charity fundraiser in the Last Year:   . Arboriculturist in the Last Year:   Transportation Needs:   . Film/video editor (Medical):   Marland Kitchen Lack of Transportation (Non-Medical):   Physical Activity:   . Days of Exercise per Week:   . Minutes of Exercise per Session:   Stress:   . Feeling of Stress :   Social Connections:   . Frequency of Communication with Friends and Family:   . Frequency of Social Gatherings with Friends and Family:   . Attends Religious Services:   . Active Member of Clubs or Organizations:   . Attends Archivist Meetings:   Marland Kitchen Marital Status:   Intimate Partner Violence:   . Fear of Current or Ex-Partner:   . Emotionally Abused:   Marland Kitchen Physically Abused:   . Sexually Abused:     Past Surgical History:  Procedure Laterality Date  . APPENDECTOMY    .  EYE SURGERY Left    vitrectomy  . FALSE ANEURYSM REPAIR Right 12/03/2017   Procedure: REPAIR OF RIGHT FEMORAL FALSE ANEURYSM;  Surgeon: Rosetta Posner, MD;  Location: [place];  Service: Vascular;  Laterality: Right;  . IR ANGIOGRAM EXTREMITY LEFT  11/05/2017  . IR ARCH CERVICICEBRAL NON-SEL (MS)  11/05/2017  . RADIOLOGY WITH ANESTHESIA N/A 11/05/2017   Procedure: RADIOLOGY WITH ANESTHESIA;  Surgeon: Luanne Bras, MD;  Location: [place];  Service: Radiology;  Laterality: N/A;  . TOTAL HIP ARTHROPLASTY Right 09/03/2014   Procedure: RIGHT TOTAL HIP ARTHROPLASTY ANTERIOR APPROACH;  Surgeon: Renette Butters, MD;  Location: [place];  Service: Orthopedics;  Laterality: Right;  . TUBAL LIGATION      Family History  Problem Relation Age of Onset  . Diabetes Brother   . Heart disease Brother   . Hypertension Brother   . Stroke Mother   .  Hypertension Mother   . Heart disease Father        CABG, MI  . Cancer Father        skin    Allergies  Allergen Reactions  . Ace Inhibitors Anaphylaxis    Angioedema, perioperative  . Celebrex [Celecoxib] Itching and Other (See Comments)    Twitching   . Antihistamines, Chlorpheniramine-Type Other (See Comments)    A-fib  . Aspirin Other (See Comments)    History of hemorrhagic stroke  . Cortisone Other (See Comments)    "cortisone shot made me jerk"  . Lidocaine Other (See Comments)    Pt was fine for ~2 days after she received medication and states she then got "sore all over and could not move"  . Phenytoin Swelling    Swelling mostly in arms  . Sulfonamide Derivatives Itching    Current Outpatient Medications on File Prior to Visit  Medication Sig Dispense Refill  . apixaban (ELIQUIS) 5 MG TABS tablet Take 1 tablet (5 mg total) by mouth 2 (two) times daily. appt required for refills [phone] 180 tablet 1  . carvedilol (COREG) 12.5 MG tablet Take 1 tablet (12.5 mg total) by mouth 2 (two) times daily. 180 tablet 1  . hydrochlorothiazide (HYDRODIURIL) 25 MG tablet Take 1 tablet (25 mg total) by mouth daily. 90 tablet 0  . vitamin B-12 (CYANOCOBALAMIN) 100 MCG tablet Take 100 mcg by mouth daily.      No current facility-administered medications on file prior to visit.    BP 140/90   Temp (!) 97.1 F (36.2 C)   Wt 132 lb (59.9 kg)   BMI 20.07 kg/m       Objective:   Physical Exam Vitals and nursing note reviewed.  Constitutional:      Appearance: Normal appearance.  Cardiovascular:     Rate and Rhythm: Normal rate. Rhythm irregularly irregular.     Pulses: Normal pulses.     Heart sounds: Normal heart sounds.  Pulmonary:     Effort: Pulmonary effort is normal.     Breath sounds: Normal breath sounds.  Musculoskeletal:        General: Normal range of motion.  Skin:    General: Skin is warm and dry.  Neurological:     General: No focal deficit present.      Mental Status: She is alert and oriented to person, place, and time.  Psychiatric:        Mood and Affect: Mood normal.        Behavior: Behavior normal.  Thought Content: Thought content normal.        Judgment: Judgment normal.       Assessment & Plan:  1. Essential hypertension - Will refill for 90 days. Needs CPE in that time frame.  - Basic Metabolic Panel - hydrochlorothiazide (HYDRODIURIL) 25 MG tablet; Take 1 tablet (25 mg total) by mouth daily.  Dispense: 90 tablet; Refill: 0   Dorothyann Peng, NP

## 2019-08-01 LAB — BASIC METABOLIC PANEL
BUN: 7 mg/dL (ref 6–23)
CO2: 31 mEq/L (ref 19–32)
Calcium: 9.5 mg/dL (ref 8.4–10.5)
Chloride: 93 mEq/L — ABNORMAL LOW (ref 96–112)
Creatinine, Ser: 0.66 mg/dL (ref 0.40–1.20)
GFR: 86.35 mL/min (ref >60.00)
Glucose, Bld: 95 mg/dL (ref 70–99)
Potassium: 3.6 mEq/L (ref 3.5–5.1)
Sodium: 132 mEq/L — ABNORMAL LOW (ref 135–145)

## 2019-08-02 ENCOUNTER — Other Ambulatory Visit: Payer: Self-pay | Admitting: Adult Health

## 2019-08-10 ENCOUNTER — Encounter: Payer: Self-pay | Admitting: Family Medicine

## 2019-10-23 ENCOUNTER — Other Ambulatory Visit: Payer: Self-pay | Admitting: Adult Health

## 2019-10-23 DIAGNOSIS — I10 Essential (primary) hypertension: Secondary | ICD-10-CM

## 2019-10-24 NOTE — Telephone Encounter (Signed)
SENT TO THE PHARMACY BY E-SCRIBE FOR 90 DAYS.  PT HAS UPCOMING CPX IN SEPT.

## 2019-12-12 ENCOUNTER — Encounter: Payer: Medicare Other | Admitting: Adult Health

## 2020-01-07 ENCOUNTER — Ambulatory Visit (HOSPITAL_COMMUNITY)
Admission: RE | Admit: 2020-01-07 | Discharge: 2020-01-07 | Disposition: A | Discharge status: Home / Self Care | Payer: Medicare Other | Source: Ambulatory Visit | Attending: Nurse Practitioner | Admitting: Nurse Practitioner

## 2020-01-07 ENCOUNTER — Encounter (HOSPITAL_COMMUNITY): Payer: Self-pay | Admitting: Nurse Practitioner

## 2020-01-07 ENCOUNTER — Other Ambulatory Visit: Payer: Self-pay

## 2020-01-07 VITALS — BP 166/88 | HR 92 | Ht 68.0 in | Wt 128.8 lb

## 2020-01-07 DIAGNOSIS — Z8673 Personal history of transient ischemic attack (TIA), and cerebral infarction without residual deficits: Secondary | ICD-10-CM | POA: Diagnosis not present

## 2020-01-07 DIAGNOSIS — Z79899 Other long term (current) drug therapy: Secondary | ICD-10-CM | POA: Insufficient documentation

## 2020-01-07 DIAGNOSIS — D6869 Other thrombophilia: Secondary | ICD-10-CM | POA: Diagnosis not present

## 2020-01-07 DIAGNOSIS — I4891 Unspecified atrial fibrillation: Secondary | ICD-10-CM | POA: Insufficient documentation

## 2020-01-07 DIAGNOSIS — I4821 Permanent atrial fibrillation: Secondary | ICD-10-CM | POA: Diagnosis not present

## 2020-01-07 DIAGNOSIS — Z96641 Presence of right artificial hip joint: Secondary | ICD-10-CM | POA: Diagnosis not present

## 2020-01-07 DIAGNOSIS — Z8249 Family history of ischemic heart disease and other diseases of the circulatory system: Secondary | ICD-10-CM | POA: Insufficient documentation

## 2020-01-07 DIAGNOSIS — I10 Essential (primary) hypertension: Secondary | ICD-10-CM | POA: Diagnosis not present

## 2020-01-07 DIAGNOSIS — Z7901 Long term (current) use of anticoagulants: Secondary | ICD-10-CM | POA: Insufficient documentation

## 2020-01-07 DIAGNOSIS — Z634 Disappearance and death of family member: Secondary | ICD-10-CM | POA: Insufficient documentation

## 2020-01-07 MED ORDER — CARVEDILOL 12.5 MG PO TABS: 12.5000 mg | ORAL_TABLET | Freq: Two times a day (BID) | ORAL | 2 refills | Status: AC

## 2020-01-07 MED ORDER — APIXABAN 5 MG PO TABS: 5.0000 mg | ORAL_TABLET | Freq: Two times a day (BID) | ORAL | 2 refills | Status: AC

## 2020-01-07 NOTE — Progress Notes (Signed)
Patient ID: Kayla Wilson, female   DOB: [birth year], 79 y.o.   MRN: [id number]     Primary Care Physician: Dorothyann Peng, NP Referring Physician: Dr. Laverda Page is a 79 y.o. female with a h/o chronic afib for f/u. When initally seen,  was very clear that she did not want to take anticoagulation. Unfortunately, she had a stroke in August of 2019 and finally consented to start taking eliquis.  Afib is well rate controlled. She lost her husband in June 2020 from  consequence of  CAD. She states that her BP is up today as she did not take her BB, needing to pick up refill.   No bleeding issues with anticoagulation.CHA2DS2VASc score of at least 6.  Today, she denies symptoms of palpitations, chest pain, shortness of breath, orthopnea, PND, lower extremity edema, dizziness, presyncope, syncope, or neurologic sequela. The patient is tolerating medications without difficulties and is otherwise without complaint today.   Past Medical History:  Diagnosis Date  . Atrial fibrillation ([place])   . Colon polyps   . Complication of anesthesia    difficulty awakening  . CVA (cerebral vascular accident) ([place]) 11/04/2017  . Diverticulosis   . Hypertension   . 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 No intervention required    . Stroke [facility])    Past Surgical History:  Procedure Laterality Date  . APPENDECTOMY    . EYE SURGERY Left    vitrectomy  . FALSE ANEURYSM REPAIR Right 12/03/2017   Procedure: REPAIR OF RIGHT FEMORAL FALSE ANEURYSM;  Surgeon: Rosetta Posner, MD;  Location: [place];  Service: Vascular;  Laterality: Right;  . IR ANGIOGRAM EXTREMITY LEFT  11/05/2017  . IR ARCH CERVICICEBRAL NON-SEL (MS)  11/05/2017  . RADIOLOGY WITH ANESTHESIA N/A 11/05/2017   Procedure: RADIOLOGY WITH ANESTHESIA;  Surgeon: Luanne Bras, MD;  Location: [place];  Service: Radiology;   Laterality: N/A;  . TOTAL HIP ARTHROPLASTY Right 09/03/2014   Procedure: RIGHT TOTAL HIP ARTHROPLASTY ANTERIOR APPROACH;  Surgeon: Renette Butters, MD;  Location: [place];  Service: Orthopedics;  Laterality: Right;  . TUBAL LIGATION      Current Outpatient Medications  Medication Sig Dispense Refill  . apixaban (ELIQUIS) 5 MG TABS tablet Take 1 tablet (5 mg total) by mouth 2 (two) times daily. appt required for refills [phone] 180 tablet 1  . carvedilol (COREG) 12.5 MG tablet Take 1 tablet (12.5 mg total) by mouth 2 (two) times daily. 180 tablet 1  . hydrochlorothiazide (HYDRODIURIL) 25 MG tablet Take 1 tablet by mouth once daily 90 tablet 0  . vitamin B-12 (CYANOCOBALAMIN) 100 MCG tablet Take 100 mcg by mouth daily.      No current facility-administered medications for this encounter.    Allergies  Allergen Reactions  . Ace Inhibitors Anaphylaxis    Angioedema, perioperative  . Celebrex [Celecoxib] Itching and Other (See Comments)    Twitching   . Antihistamines, Chlorpheniramine-Type Other (See Comments)    A-fib  . Aspirin Other (See Comments)    History of hemorrhagic stroke  . Cortisone Other (See Comments)    "cortisone shot made me jerk"  . Lidocaine Other (See Comments)    Pt was fine for ~2 days after she received medication and states she then got "sore all over and could not move"  . Phenytoin Swelling    Swelling mostly in arms  .  Sulfonamide Derivatives Itching    Social History   Socioeconomic History  . Marital status: Married    Spouse name: Not on file  . Number of children: Not on file  . Years of education: Not on file  . Highest education level: Not on file  Occupational History  . Not on file  Tobacco Use  . Smoking status: Never Smoker  . Smokeless tobacco: Never Used  Substance and Sexual Activity  . Alcohol use: No  . Drug use: No  . Sexual activity: Not Currently  Other Topics Concern  . Not on file  Social History Narrative   Married  37 years    - Seven children 6 girls and one boy.    - three children live in [place] live [place].       11 grand children and 7 great grandchildren      Worked at [employer] for four years and then started having children       Diet: Fast food and cooks at home      Likes to [employer], flowers and working in the yard, Firefighter.          Social Determinants of Health   Financial Resource Strain:   . Difficulty of Paying Living Expenses: Not on file  Food Insecurity:   . Worried About Charity fundraiser in the Last Year: Not on file  . Ran Out of Food in the Last Year: Not on file  Transportation Needs:   . Lack of Transportation (Medical): Not on file  . Lack of Transportation (Non-Medical): Not on file  Physical Activity:   . Days of Exercise per Week: Not on file  . Minutes of Exercise per Session: Not on file  Stress:   . Feeling of Stress : Not on file  Social Connections:   . Frequency of Communication with Friends and Family: Not on file  . Frequency of Social Gatherings with Friends and Family: Not on file  . Attends Religious Services: Not on file  . Active Member of Clubs or Organizations: Not on file  . Attends Archivist Meetings: Not on file  . Marital Status: Not on file  Intimate Partner Violence:   . Fear of Current or Ex-Partner: Not on file  . Emotionally Abused: Not on file  . Physically Abused: Not on file  . Sexually Abused: Not on file    Family History  Problem Relation Age of Onset  . Diabetes Brother   . Heart disease Brother   . Hypertension Brother   . Stroke Mother   . Hypertension Mother   . Heart disease Father        CABG, MI  . Cancer Father        skin    ROS- All systems are reviewed and negative except as per the HPI above  Physical Exam: Vitals:   01/07/20 1421  BP: (!) 166/88  Pulse: 92  Weight: 58.4 kg  Height: 5\' 8"  (1.727 m)    GEN- The patient is well  appearing, alert and oriented x 3 today.   Head- normocephalic, atraumatic Eyes-  Sclera clear, conjunctiva pink Ears- hearing intact Oropharynx- clear Neck- supple, no JVP Lymph- no cervical lymphadenopathy Lungs- Clear to ausculation bilaterally, normal work of breathing Heart- Irregular rate and rhythm, no murmurs, rubs or gallops, PMI not laterally displaced GI- soft, NT, ND, + BS Extremities- no clubbing, cyanosis, or  edema MS- no significant deformity or atrophy Skin- no rash or lesion Psych- euthymic mood, full affect Neuro- strength and sensation are intact  EKG-afib at 92  bpm, qrs int 88 ms, qtc 462  sec Epic records reviewed  Assessment and Plan: 1. Afib Chronic/asymptomatic and rate controlled Continue coreg 12.5 mg bid( has only been taking one x day since Friday as she was running low on drug RX sent in   2. Chadsvasc of at least 6 Continue eliquis 5 mg bid  Bmet reviewed today, creatinine stable However, she will turn 80 end of February and with her current body weight,  looks she will need to be down titrated on her eliquis to 2.5 mg bid in eary March   3. HTN Elevated today Probably  2/2 not taking full dose carvedilol for the last several days      F/u here early March   Shian Goodnow C. Estelita Iten, [facility] [address] [place],  [address] [phone]

## 2020-01-24 ENCOUNTER — Other Ambulatory Visit: Payer: Self-pay | Admitting: Adult Health

## 2020-01-24 DIAGNOSIS — I10 Essential (primary) hypertension: Secondary | ICD-10-CM

## 2020-01-26 IMAGING — CT CT ANGIO CHEST
2 of 5 series · 17 of 36 positions shown · IV contrast (Omni 300)
Comparison: None.

CLINICAL DATA: Thoracic aortic aneurysm.

EXAM:
CT ANGIOGRAPHY CHEST WITH CONTRAST
TECHNIQUE: Multidetector CT imaging of the chest was performed using the
standard protocol during bolus administration of intravenous
contrast. Multiplanar CT image reconstructions and MIPs were
obtained to evaluate the vascular anatomy.
CONTRAST:  75mL NQU27A-8HZ IOPAMIDOL (NQU27A-8HZ) INJECTION 76%

[Series 5: thoracic cta 2mm · axial · 0.59mm/px · z∈[+1128,+1426]mm · 16 of 161 slices shown]
[im 6/161  lung]
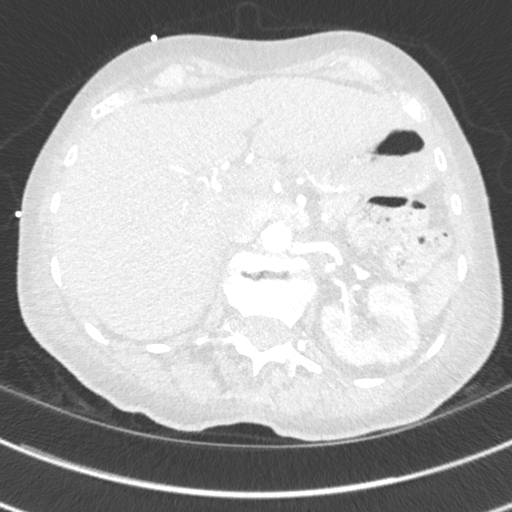
[im 18/161  mediastinal]
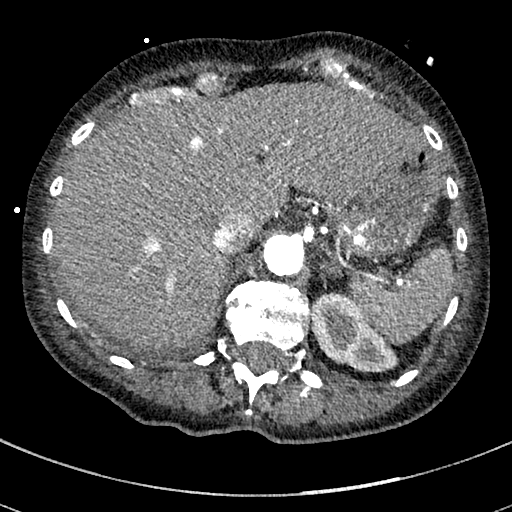
[im 30/161  lung]
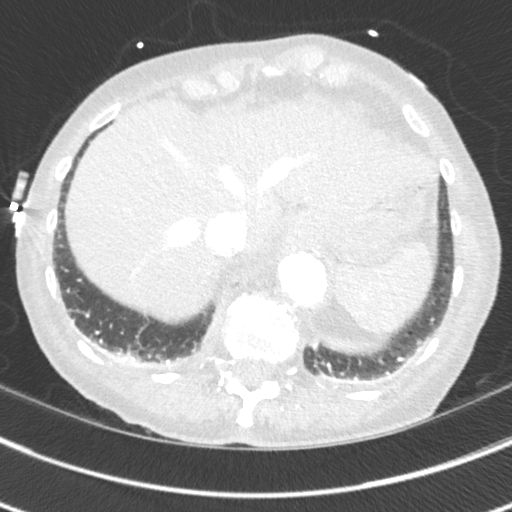
[im 36/161  mediastinal]
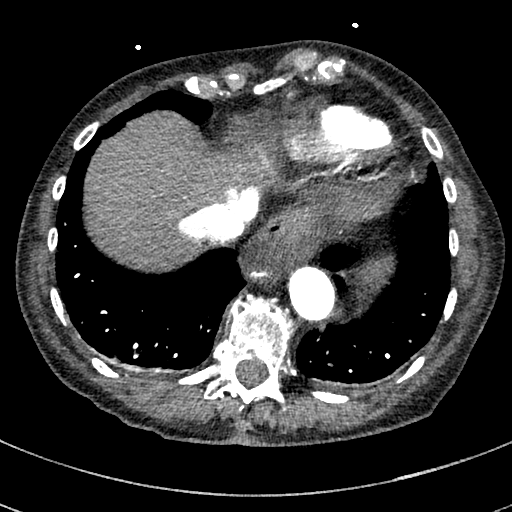
[im 48/161  lung]
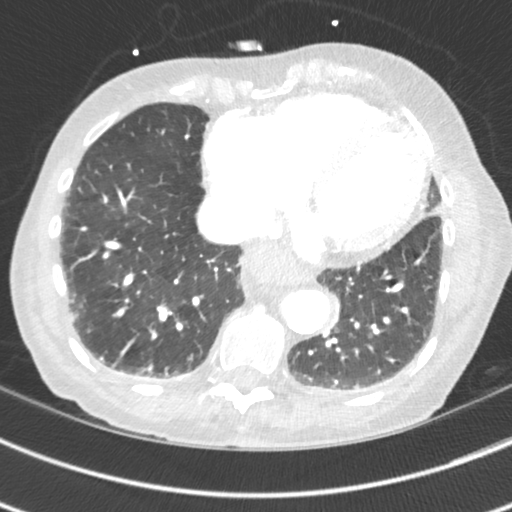
[im 54/161  mediastinal]
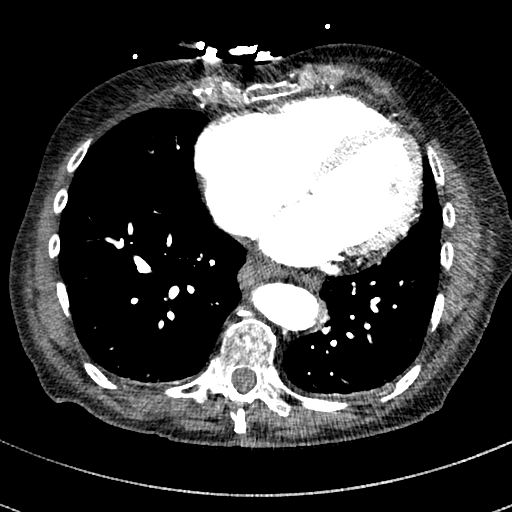
[im 66/161  lung]
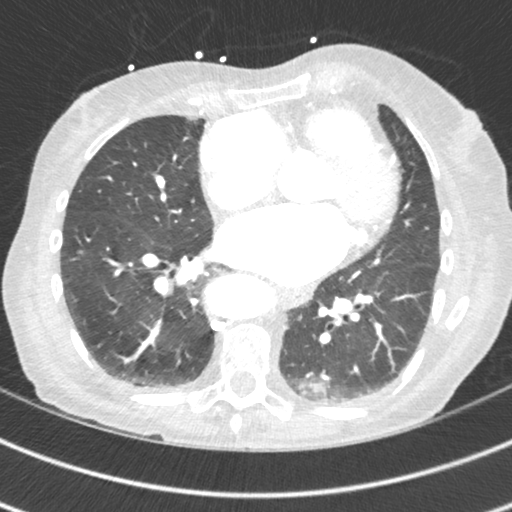
[im 78/161  mediastinal]
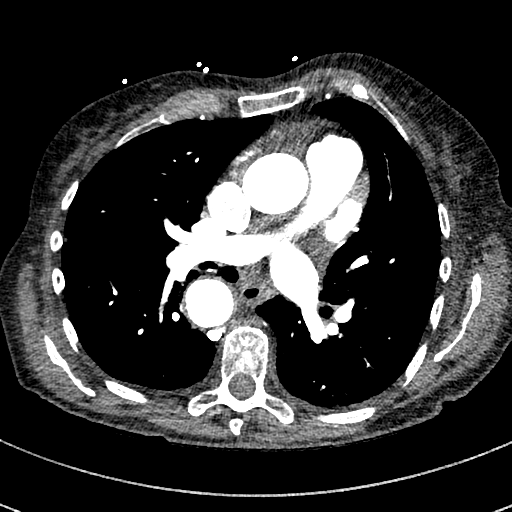
[im 83/161  lung]
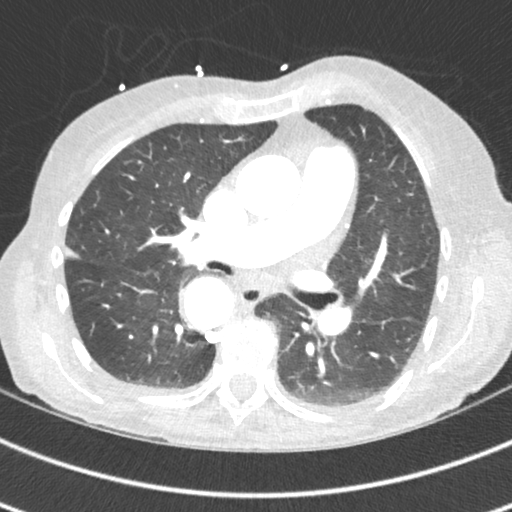
[im 95/161  mediastinal]
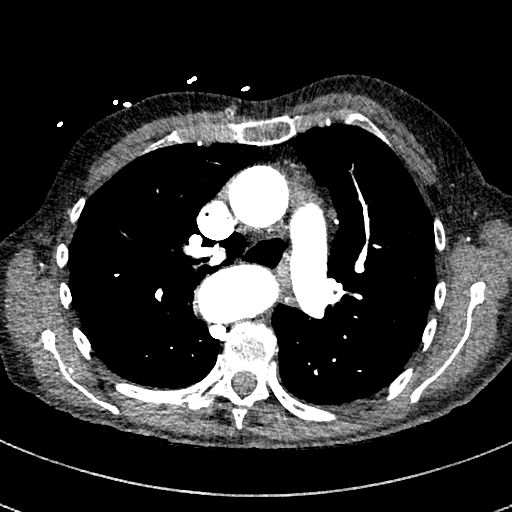
[im 107/161  lung]
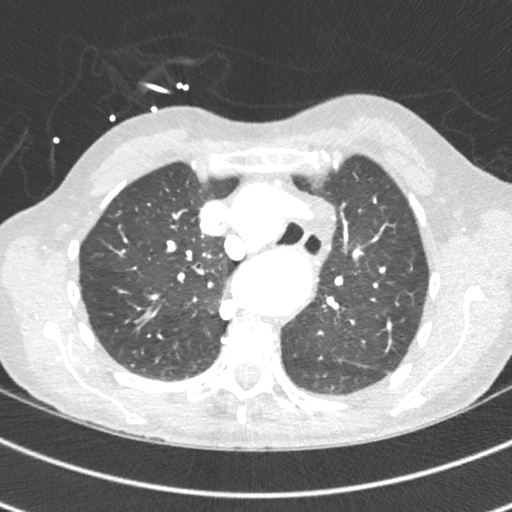
[im 113/161  mediastinal]
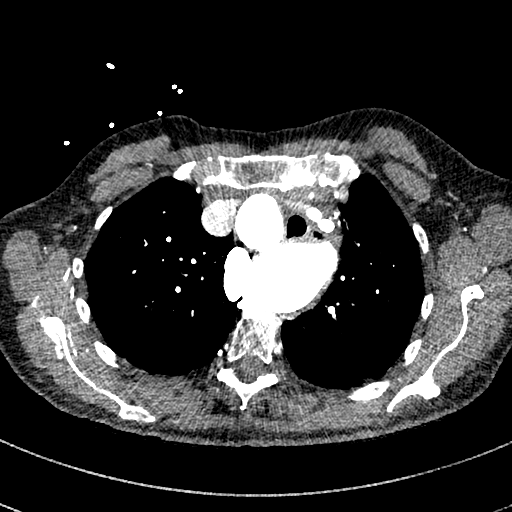
[im 125/161  lung]
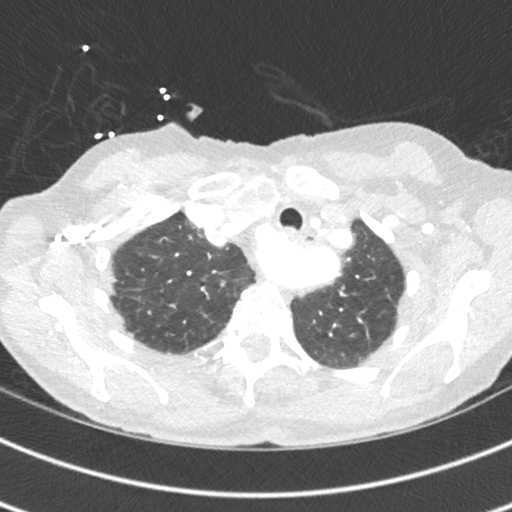
[im 131/161  mediastinal]
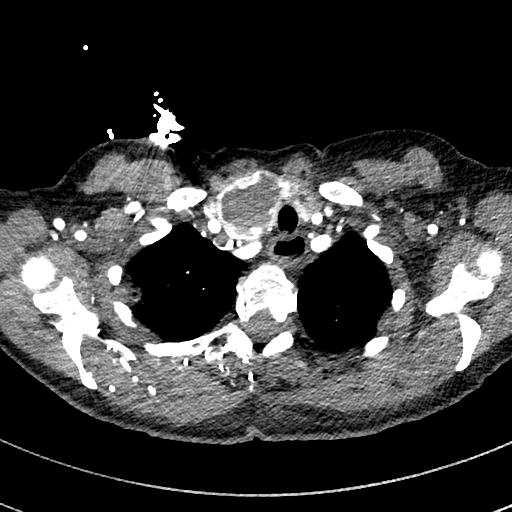
[im 143/161  lung]
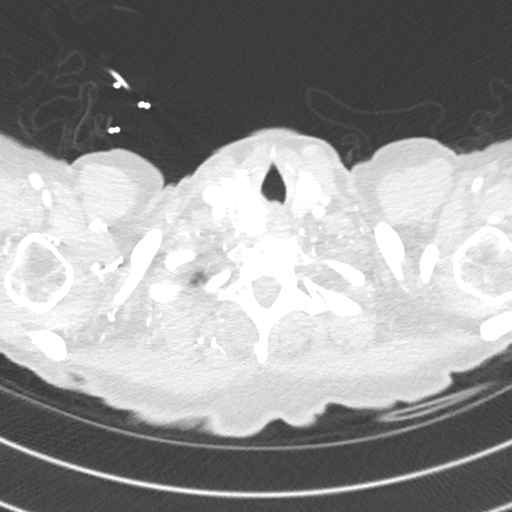
[im 155/161  mediastinal]
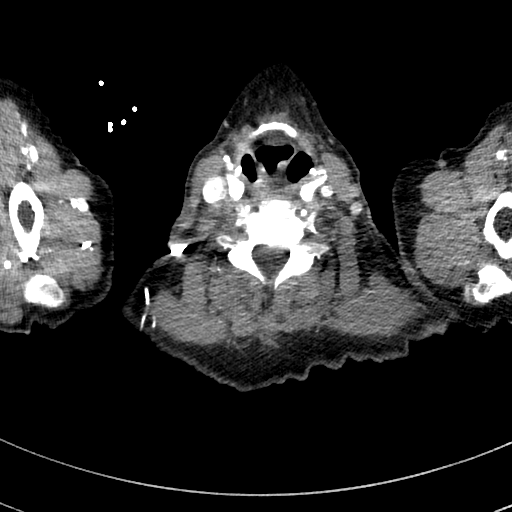

[Series 8: thoracic cta 2mm cor · coronal · 0.56mm/px · 1 of 109 slices shown]
[im 55/109  mediastinal]
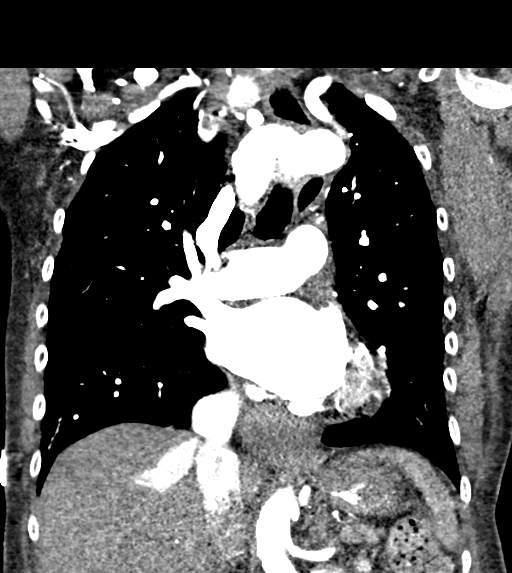

[17 of 36 positions shown; findings below may reference images not displayed]

FINDINGS: Cardiovascular: There is congenital double aortic arch anomaly
present resulting in vascular ring surrounding esophagus and
trachea. The right subclavian and right common carotid arteries
arise proximally from the larger right-sided arch, with the left
common and subclavian arteries arising separately from the smaller
left-sided aortic arch. Atherosclerosis of the thoracic aorta is
noted without dissection. Tortuosity of descending thoracic aorta is
noted. Proximal descending thoracic aorta has maximum measured
diameter 3.2 cm. Proximal ascending thoracic aorta measures 3.5 cm.
There is focal saccular aneurysm measuring 3.1 x 3.0 cm where the
smaller left-sided aortic arch connects with the larger right-sided
arch to form the descending thoracic aorta.

Mediastinum/Nodes: 4.6 x 3.7 cm right thyroid mass is noted.
Moderate size sliding-type hiatal hernia is noted. No definite
adenopathy is noted.

Lungs/Pleura: No pneumothorax or pleural effusion is noted. Mild
biapical scarring is noted. Focal density measuring 1.9 x 1.4 cm is
noted anteriorly in right upper lobe with surrounding ground-glass
opacity. This may simply represent focal inflammation or pneumonia
but neoplasm cannot be excluded. Small focal opacity is noted
posteriorly in the left lower lobe concerning for atelectasis or
infiltrate. Similar but smaller opacities are noted in the right
upper lobe posteriorly.

Upper Abdomen: No acute abnormality.

Musculoskeletal: No chest wall abnormality. No acute or significant
osseous findings.

Review of the MIP images confirms the above findings.
IMPRESSION: 1.9 x 1.4 cm focal density is noted anteriorly in right upper lobe
with surrounding ground-glass opacity. This may recently represent
focal pneumonia, but neoplasm cannot be excluded. Smaller opacities
are noted throughout both lungs also suggesting pneumonia or
possibly malignancy. Follow-up unenhanced chest CT in 2-3 weeks is
recommended to ensure resolution of these abnormalities and rule out
potential underlying malignancy.

Congenital double aortic arch anomaly is present resulting in
vascular ring surrounding esophagus and trachea. Right subclavian
and right common carotid arteries arise separately from the larger
right-sided arch, with the left common and subclavian arteries
arising separately from the significantly smaller left-sided aortic
arch. 3.1 x 3.0 cm focal saccular aneurysm is noted with the smaller
left-sided arch reconnect with the larger right-sided arch to form
the descending thoracic aorta. Follow-up CTA in 12 months is
recommended to ensure stability.

4.6 cm right thyroid mass is noted. Thyroid ultrasound is
recommended for further evaluation.

Moderate size sliding-type hiatal hernia is noted.

Aortic Atherosclerosis (VTUGS-ZN8.8).

## 2020-02-17 IMAGING — CT CT CHEST W/O CM
2 of 3 series · 15 of 36 positions shown, 18 images · non-contrast
Comparison: 11/06/2017.

CLINICAL DATA: Follow-up of right upper lobe pulmonary opacity.

EXAM:
CT CHEST WITHOUT CONTRAST
TECHNIQUE: Multidetector CT imaging of the chest was performed following the
standard protocol without IV contrast.

[Series 2: thorax · axial · 0.64mm/px · z∈[-279,-1]mm · 12 of 165 slices shown, 15 images]
[im 13/165  mediastinal]
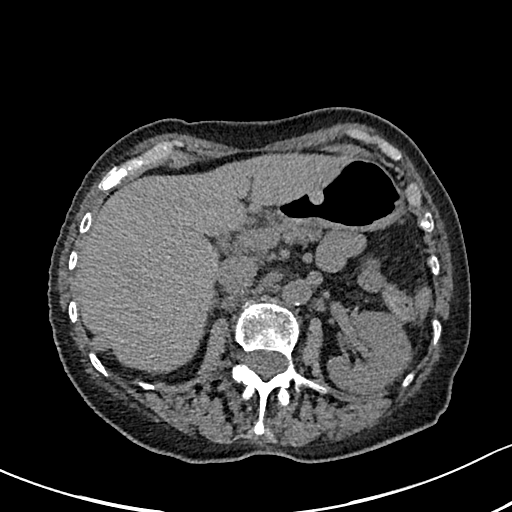
[im 13/165  lung]
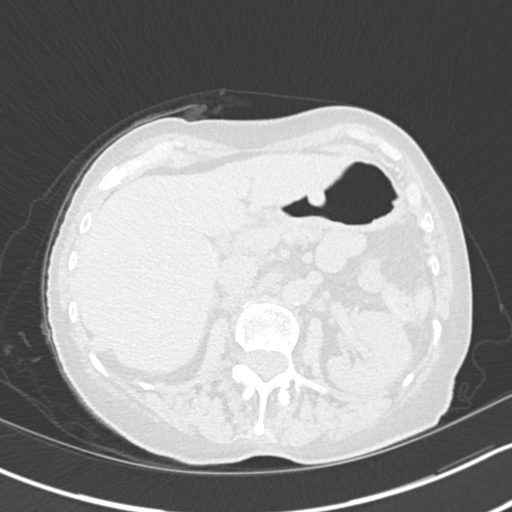
[im 25/165  lung]
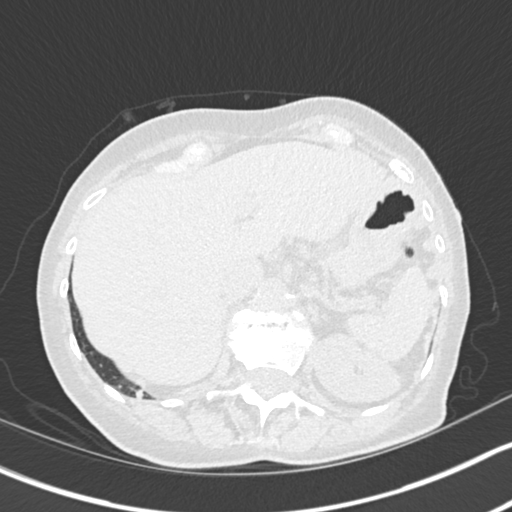
[im 37/165  lung]
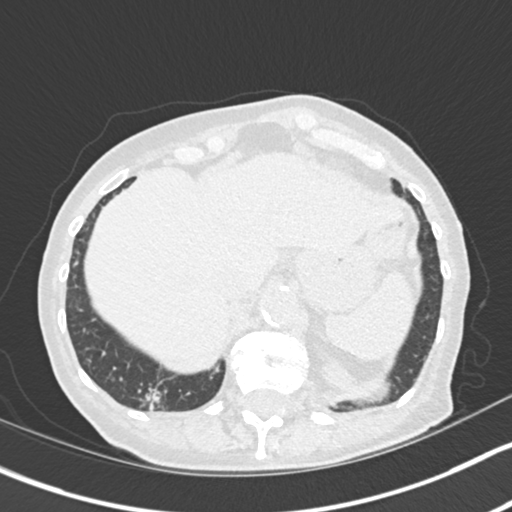
[im 49/165  lung]
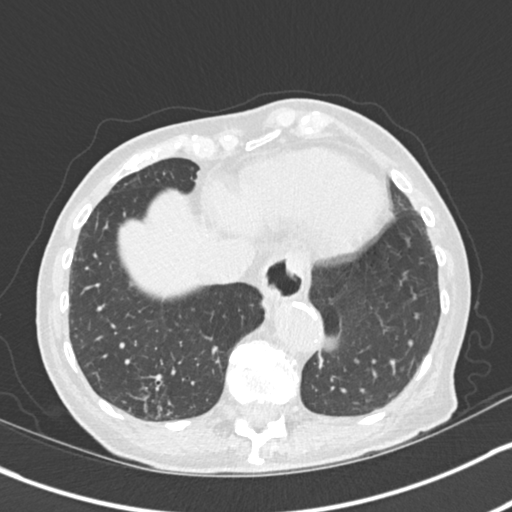
[im 61/165  mediastinal]
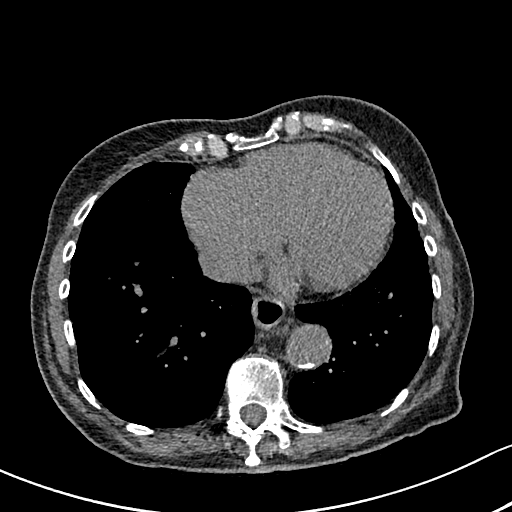
[im 61/165  lung]
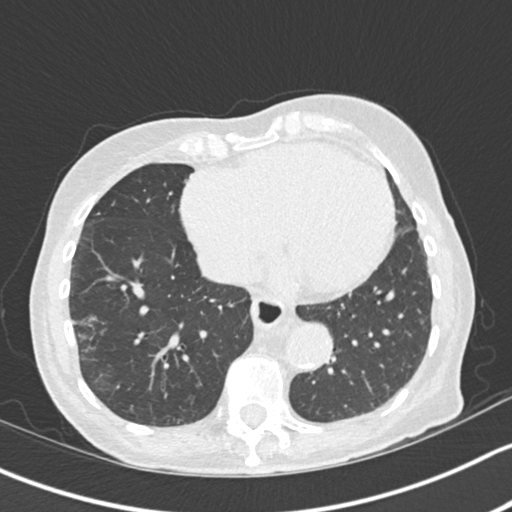
[im 73/165  lung]
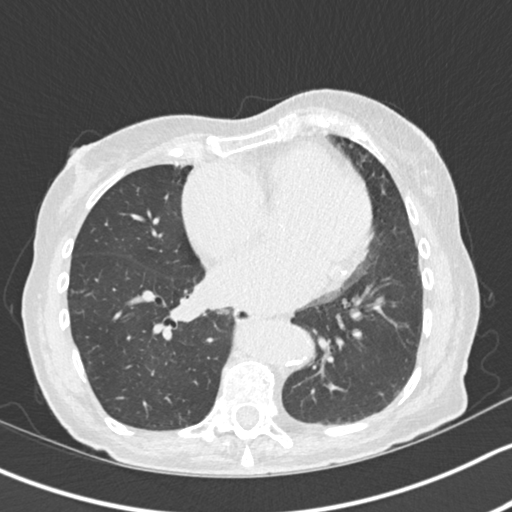
[im 92/165  lung]
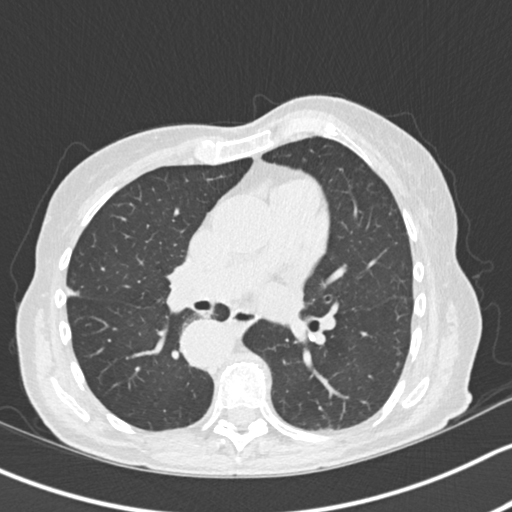
[im 104/165  lung]
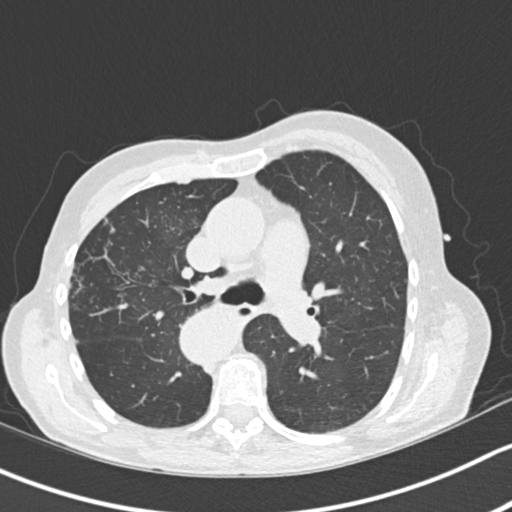
[im 116/165  mediastinal]
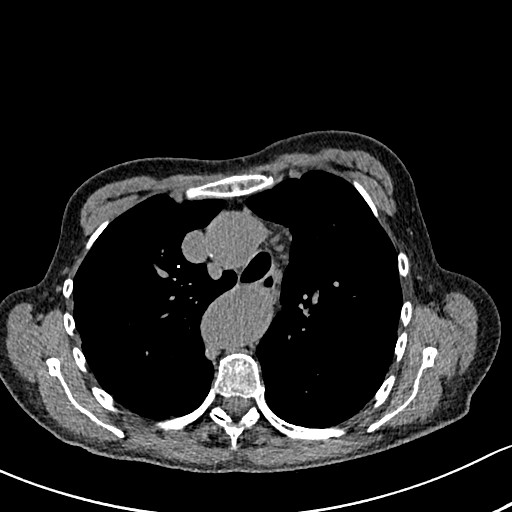
[im 116/165  lung]
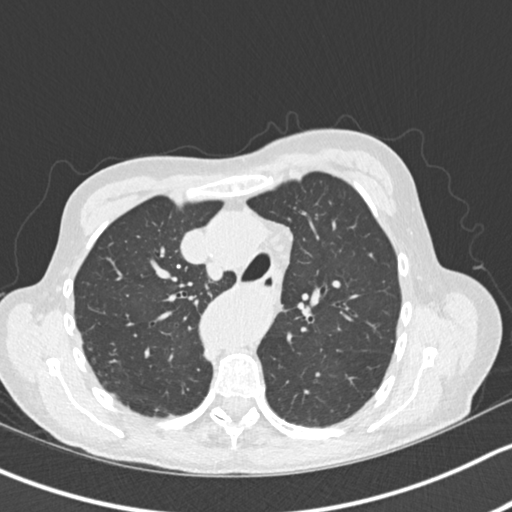
[im 128/165  lung]
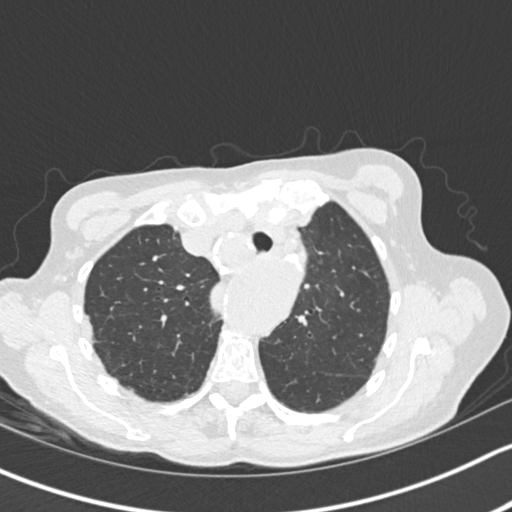
[im 140/165  lung]
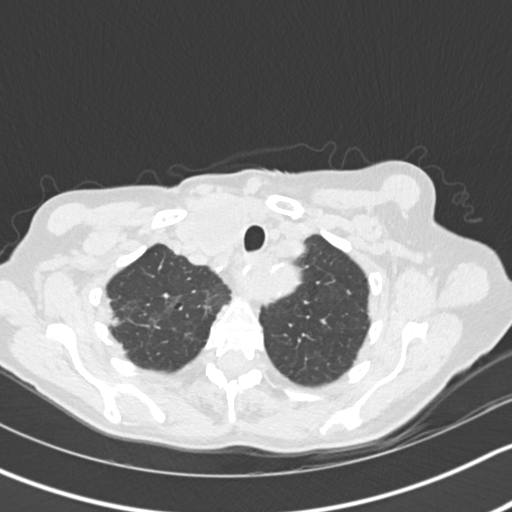
[im 152/165  lung]
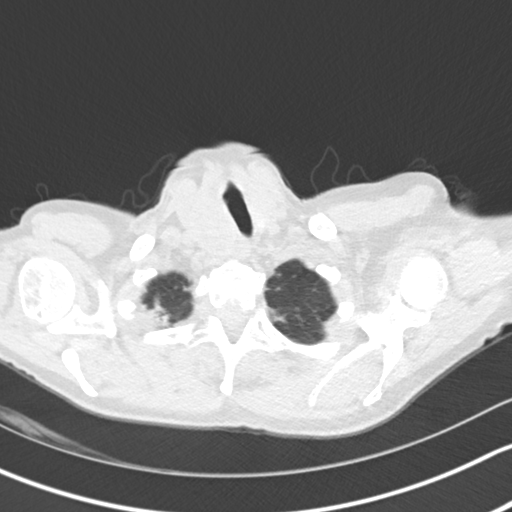

[Series 5: coronal · coronal · 0.54mm/px · 3 of 122 slices shown]
[im 25/122  lung]
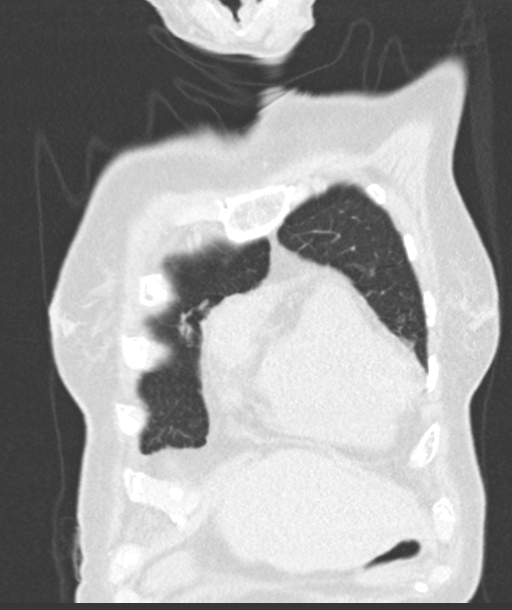
[im 49/122  lung]
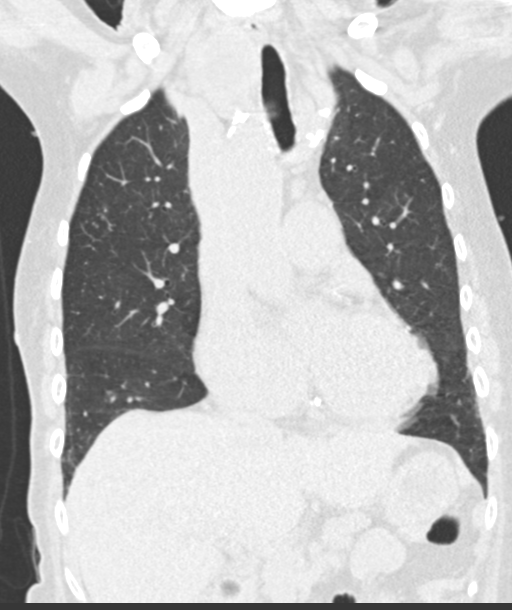
[im 73/122  lung]
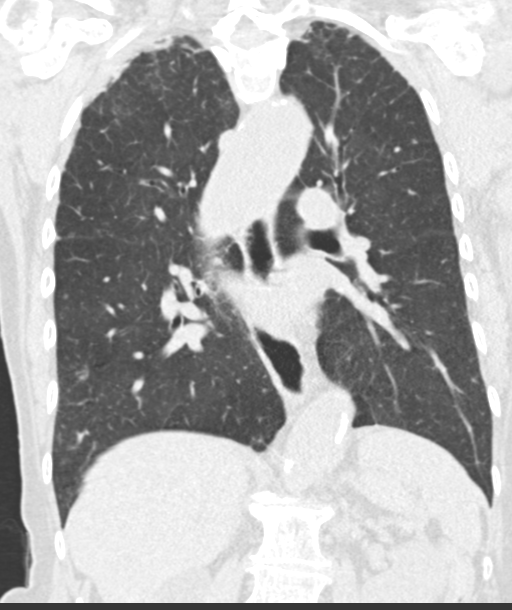

[15 of 36 positions shown; findings below may reference images not displayed]

FINDINGS: Cardiovascular: Complex aortic and branch vessel anatomy including
congenital double arch, grossly similar to the prior and better
delineated on that dedicated contrast-enhanced exam. Aortic
Atherosclerosis.

Mild cardiomegaly, without pericardial effusion. Multivessel
coronary artery atherosclerosis.

Mediastinum/Nodes: Right-sided thyroid mass again identified,
including at 4.2 x 3.7 cm. No mediastinal or definite hilar
adenopathy, given limitations of unenhanced CT. Small hiatal hernia.
Fluid level in the esophagus including on image 70/2.

Lungs/Pleura: No pleural fluid. Mild biapical pleuroparenchymal
scarring.

Anteromedial right upper lobe dominant pulmonary opacity measures
1.7 x 1.1 cm on image 68/3. Slightly less masslike than at 1.9 x
cm on the prior exam. The surrounding ground-glass opacity has
resolved.

There are bilateral, right greater than left areas of clustered
"tree-in-bud" nodularity. The majority of these are similar. Slight
improvement in left lower lobe aeration. Medial right lower lobe
clustered tree-in-bud nodularity is progressive, including on image
110/3.

Many of the nodular opacities are more distinct today, primarily
felt to be due to decreased respiratory motion on the current exam.

Upper Abdomen: Normal imaged portions of the liver, pancreas,
adrenal glands, left kidney. Upper pole right renal 2.8 cm
low-density lesion is likely a cyst. Old granulomatous disease in
the spleen. Abdominal aortic atherosclerosis.

Musculoskeletal: Moderate thoracic spondylosis.
IMPRESSION: 1. Right upper lobe pleural-based pulmonary opacity persists but is
felt to be slightly decreased today, with decreased surrounding
ground-glass. Findings favor resolving infection. Other areas of
bilateral clustered "tree-in-bud" nodularity, primarily similar.
Some are improved and some are slightly increased. Findings favor
infectious bronchiolitis versus less likely aspiration. Consider
antibiotic therapy and CT follow-up at 3 months to confirm ongoing
resolution of the dominant lesion.
2. Small hiatal hernia. Esophageal air fluid level suggests
dysmotility or gastroesophageal reflux.
3. Coronary artery atherosclerosis. Aortic Atherosclerosis
(8734Z-BDB.B).
4. Complex congenital double aortic arch with left-sided component
aneurysm, as before. Better evaluated on prior CTA.

## 2020-02-29 ENCOUNTER — Telehealth: Payer: Self-pay | Admitting: Adult Health

## 2020-02-29 ENCOUNTER — Other Ambulatory Visit: Payer: Self-pay

## 2020-02-29 DIAGNOSIS — I10 Essential (primary) hypertension: Secondary | ICD-10-CM

## 2020-02-29 MED ORDER — HYDROCHLOROTHIAZIDE 25 MG PO TABS: 25.0000 mg | ORAL_TABLET | Freq: Every day | ORAL | 0 refills | Status: AC

## 2020-02-29 NOTE — Telephone Encounter (Signed)
    pt been out of her med since Monday requesting a refill for  hydrochlorothiazide (HydroDIURIL) 25 MG tablet [place], [place] [facility] [address]  Phone:  [phone] Fax:  [phone] pt is scheduled for an appt with Georgina Snell 03/20/2020

## 2020-02-29 NOTE — Telephone Encounter (Signed)
Rx sent in tried calling patient to inform, no answer unable to leave message.

## 2020-03-07 ENCOUNTER — Emergency Department (HOSPITAL_COMMUNITY): Payer: Medicare Other

## 2020-03-07 ENCOUNTER — Inpatient Hospital Stay (HOSPITAL_COMMUNITY)
Admission: EM | Admit: 2020-03-07 | Discharge: 2020-03-22 | DRG: 951 | Disposition: E | Payer: Medicare Other | Attending: Family Medicine | Admitting: Family Medicine

## 2020-03-07 ENCOUNTER — Encounter (HOSPITAL_COMMUNITY): Payer: Self-pay | Admitting: Pharmacy Technician

## 2020-03-07 DIAGNOSIS — I872 Venous insufficiency (chronic) (peripheral): Secondary | ICD-10-CM | POA: Diagnosis not present

## 2020-03-07 DIAGNOSIS — I629 Nontraumatic intracranial hemorrhage, unspecified: Secondary | ICD-10-CM | POA: Diagnosis not present

## 2020-03-07 DIAGNOSIS — G9349 Other encephalopathy: Secondary | ICD-10-CM | POA: Diagnosis not present

## 2020-03-07 DIAGNOSIS — Z515 Encounter for palliative care: Secondary | ICD-10-CM | POA: Diagnosis not present

## 2020-03-07 DIAGNOSIS — I110 Hypertensive heart disease with heart failure: Secondary | ICD-10-CM | POA: Diagnosis not present

## 2020-03-07 DIAGNOSIS — Z7189 Other specified counseling: Secondary | ICD-10-CM | POA: Diagnosis not present

## 2020-03-07 DIAGNOSIS — Z66 Do not resuscitate: Secondary | ICD-10-CM | POA: Diagnosis present

## 2020-03-07 DIAGNOSIS — Z8719 Personal history of other diseases of the digestive system: Secondary | ICD-10-CM | POA: Diagnosis not present

## 2020-03-07 DIAGNOSIS — Z789 Other specified health status: Secondary | ICD-10-CM

## 2020-03-07 DIAGNOSIS — I611 Nontraumatic intracerebral hemorrhage in hemisphere, cortical: Secondary | ICD-10-CM | POA: Diagnosis not present

## 2020-03-07 DIAGNOSIS — R0902 Hypoxemia: Secondary | ICD-10-CM | POA: Diagnosis not present

## 2020-03-07 DIAGNOSIS — Z823 Family history of stroke: Secondary | ICD-10-CM | POA: Diagnosis not present

## 2020-03-07 DIAGNOSIS — I509 Heart failure, unspecified: Secondary | ICD-10-CM | POA: Diagnosis not present

## 2020-03-07 DIAGNOSIS — Z8249 Family history of ischemic heart disease and other diseases of the circulatory system: Secondary | ICD-10-CM | POA: Diagnosis not present

## 2020-03-07 DIAGNOSIS — Z833 Family history of diabetes mellitus: Secondary | ICD-10-CM

## 2020-03-07 DIAGNOSIS — Z888 Allergy status to other drugs, medicaments and biological substances status: Secondary | ICD-10-CM | POA: Diagnosis not present

## 2020-03-07 DIAGNOSIS — Z85828 Personal history of other malignant neoplasm of skin: Secondary | ICD-10-CM

## 2020-03-07 DIAGNOSIS — Z8673 Personal history of transient ischemic attack (TIA), and cerebral infarction without residual deficits: Secondary | ICD-10-CM

## 2020-03-07 DIAGNOSIS — I615 Nontraumatic intracerebral hemorrhage, intraventricular: Secondary | ICD-10-CM

## 2020-03-07 DIAGNOSIS — Z884 Allergy status to anesthetic agent status: Secondary | ICD-10-CM

## 2020-03-07 DIAGNOSIS — I4821 Permanent atrial fibrillation: Secondary | ICD-10-CM | POA: Diagnosis not present

## 2020-03-07 DIAGNOSIS — Z882 Allergy status to sulfonamides status: Secondary | ICD-10-CM | POA: Diagnosis not present

## 2020-03-07 DIAGNOSIS — Z20822 Contact with and (suspected) exposure to covid-19: Secondary | ICD-10-CM | POA: Diagnosis present

## 2020-03-07 DIAGNOSIS — R404 Transient alteration of awareness: Secondary | ICD-10-CM | POA: Diagnosis not present

## 2020-03-07 DIAGNOSIS — Z96641 Presence of right artificial hip joint: Secondary | ICD-10-CM | POA: Diagnosis not present

## 2020-03-07 DIAGNOSIS — Z79899 Other long term (current) drug therapy: Secondary | ICD-10-CM

## 2020-03-07 DIAGNOSIS — E871 Hypo-osmolality and hyponatremia: Secondary | ICD-10-CM | POA: Diagnosis not present

## 2020-03-07 DIAGNOSIS — M79604 Pain in right leg: Secondary | ICD-10-CM | POA: Diagnosis present

## 2020-03-07 DIAGNOSIS — Z886 Allergy status to analgesic agent status: Secondary | ICD-10-CM

## 2020-03-07 DIAGNOSIS — G9340 Encephalopathy, unspecified: Secondary | ICD-10-CM

## 2020-03-07 DIAGNOSIS — E785 Hyperlipidemia, unspecified: Secondary | ICD-10-CM | POA: Diagnosis present

## 2020-03-07 DIAGNOSIS — E876 Hypokalemia: Secondary | ICD-10-CM | POA: Diagnosis not present

## 2020-03-07 DIAGNOSIS — I517 Cardiomegaly: Secondary | ICD-10-CM | POA: Diagnosis not present

## 2020-03-07 DIAGNOSIS — Z808 Family history of malignant neoplasm of other organs or systems: Secondary | ICD-10-CM

## 2020-03-07 DIAGNOSIS — Z8679 Personal history of other diseases of the circulatory system: Secondary | ICD-10-CM

## 2020-03-07 DIAGNOSIS — R112 Nausea with vomiting, unspecified: Secondary | ICD-10-CM | POA: Diagnosis not present

## 2020-03-07 DIAGNOSIS — R401 Stupor: Secondary | ICD-10-CM | POA: Diagnosis not present

## 2020-03-07 DIAGNOSIS — R6889 Other general symptoms and signs: Secondary | ICD-10-CM | POA: Diagnosis not present

## 2020-03-07 DIAGNOSIS — I6782 Cerebral ischemia: Secondary | ICD-10-CM | POA: Diagnosis not present

## 2020-03-07 DIAGNOSIS — G319 Degenerative disease of nervous system, unspecified: Secondary | ICD-10-CM | POA: Diagnosis not present

## 2020-03-07 DIAGNOSIS — Z743 Need for continuous supervision: Secondary | ICD-10-CM | POA: Diagnosis not present

## 2020-03-07 DIAGNOSIS — S06360A Traumatic hemorrhage of cerebrum, unspecified, without loss of consciousness, initial encounter: Secondary | ICD-10-CM | POA: Diagnosis not present

## 2020-03-07 DIAGNOSIS — Z7901 Long term (current) use of anticoagulants: Secondary | ICD-10-CM

## 2020-03-07 LAB — CBC
HCT: 41.1 % (ref 36.0–46.0)
Hemoglobin: 13.6 g/dL (ref 12.0–15.0)
MCH: 29 pg (ref 26.0–34.0)
MCHC: 33.1 g/dL (ref 30.0–36.0)
MCV: 87.6 fL (ref 80.0–100.0)
Platelets: 282 10*3/uL (ref 150–400)
RBC: 4.69 MIL/uL (ref 3.87–5.11)
RDW: 13.5 % (ref 11.5–15.5)
WBC: 6.3 10*3/uL (ref 4.0–10.5)
nRBC: 0 % (ref 0.0–0.2)

## 2020-03-07 LAB — COMPREHENSIVE METABOLIC PANEL
ALT: 14 U/L (ref 0–44)
AST: 26 U/L (ref 15–41)
Albumin: 3.9 g/dL (ref 3.5–5.0)
Alkaline Phosphatase: 49 U/L (ref 38–126)
Anion gap: 10 (ref 5–15)
BUN: 5 mg/dL — ABNORMAL LOW (ref 8–23)
CO2: 27 mmol/L (ref 22–32)
Calcium: 9.8 mg/dL (ref 8.9–10.3)
Chloride: 92 mmol/L — ABNORMAL LOW (ref 98–111)
Creatinine, Ser: 0.71 mg/dL (ref 0.44–1.00)
GFR, Estimated: >60 mL/min (ref >60)
Glucose, Bld: 135 mg/dL — ABNORMAL HIGH (ref 70–99)
Potassium: 3 mmol/L — ABNORMAL LOW (ref 3.5–5.1)
Sodium: 129 mmol/L — ABNORMAL LOW (ref 135–145)
Total Bilirubin: 1.1 mg/dL (ref 0.3–1.2)
Total Protein: 8 g/dL (ref 6.5–8.1)

## 2020-03-07 LAB — CBG MONITORING, ED: Glucose-Capillary: 103 mg/dL — ABNORMAL HIGH (ref 70–99)

## 2020-03-07 LAB — LIPASE, BLOOD: Lipase: 22 U/L (ref 11–51)

## 2020-03-07 LAB — RESP PANEL BY RT-PCR (FLU A&B, COVID) ARPGX2
Influenza A by PCR: NEGATIVE
Influenza B by PCR: NEGATIVE
SARS Coronavirus 2 by RT PCR: NEGATIVE

## 2020-03-07 MED ORDER — HALOPERIDOL 0.5 MG PO TABS
0.5000 mg | ORAL_TABLET | ORAL | Status: DC | PRN
  Filled 2020-03-07: qty 1

## 2020-03-07 MED ORDER — CLEVIDIPINE BUTYRATE 0.5 MG/ML IV EMUL
0.0000 mg/h | INTRAVENOUS | Status: DC
  Administered 2020-03-07: 13:00:00 2 mg/h via INTRAVENOUS
  Filled 2020-03-07: qty 50

## 2020-03-07 MED ORDER — ONDANSETRON HCL 4 MG/2ML IJ SOLN: 4.0000 mg | Freq: Once | INTRAMUSCULAR | Status: DC

## 2020-03-07 MED ORDER — HALOPERIDOL LACTATE 5 MG/ML IJ SOLN: 0.5000 mg | INTRAMUSCULAR | Status: DC | PRN

## 2020-03-07 MED ORDER — ONDANSETRON HCL 4 MG/2ML IJ SOLN: 4.0000 mg | Freq: Four times a day (QID) | INTRAMUSCULAR | Status: DC | PRN

## 2020-03-07 MED ORDER — GLYCOPYRROLATE 0.2 MG/ML IJ SOLN
0.2000 mg | INTRAMUSCULAR | Status: DC | PRN
  Administered 2020-03-10: 07:00:00 0.2 mg via INTRAVENOUS
  Filled 2020-03-07: qty 1

## 2020-03-07 MED ORDER — HALOPERIDOL LACTATE 2 MG/ML PO CONC
0.5000 mg | ORAL | Status: DC | PRN
  Filled 2020-03-07: qty 0.3

## 2020-03-07 MED ORDER — POLYVINYL ALCOHOL 1.4 % OP SOLN
1.0000 [drp] | Freq: Four times a day (QID) | OPHTHALMIC | Status: DC | PRN
Start: 2020-03-07 — End: 2020-03-10
  Filled 2020-03-07: qty 15

## 2020-03-07 MED ORDER — ACETAMINOPHEN 325 MG PO TABS: 650.0000 mg | ORAL_TABLET | Freq: Four times a day (QID) | ORAL | Status: DC | PRN

## 2020-03-07 MED ORDER — PROTHROMBIN COMPLEX CONC HUMAN 500 UNITS IV KIT
3194.0000 [IU] | PACK | Status: AC
  Administered 2020-03-07: 14:00:00 3194 [IU] via INTRAVENOUS
  Filled 2020-03-07: qty 3194

## 2020-03-07 MED ORDER — BIOTENE DRY MOUTH MT LIQD
15.0000 mL | Freq: Two times a day (BID) | OROMUCOSAL | Status: DC
  Administered 2020-03-08 – 2020-03-10 (×5): 15 mL via TOPICAL

## 2020-03-07 MED ORDER — ONDANSETRON 4 MG PO TBDP: 4.0000 mg | ORAL_TABLET | Freq: Four times a day (QID) | ORAL | Status: DC | PRN

## 2020-03-07 MED ORDER — MORPHINE SULFATE (PF) 2 MG/ML IV SOLN
1.0000 mg | INTRAVENOUS | Status: DC | PRN
  Administered 2020-03-08 (×3): 2 mg via INTRAVENOUS
  Filled 2020-03-07 (×3): qty 1

## 2020-03-07 MED ORDER — MORPHINE SULFATE (PF) 2 MG/ML IV SOLN
2.0000 mg | Freq: Once | INTRAVENOUS | Status: AC
  Administered 2020-03-07: 17:00:00 2 mg via INTRAVENOUS
  Filled 2020-03-07: qty 1

## 2020-03-07 MED ORDER — METOPROLOL TARTRATE 5 MG/5ML IV SOLN
10.0000 mg | Freq: Four times a day (QID) | INTRAVENOUS | Status: DC
  Administered 2020-03-07: 20:00:00 10 mg via INTRAVENOUS
  Filled 2020-03-07: qty 10

## 2020-03-07 MED ORDER — LORAZEPAM 2 MG/ML IJ SOLN
1.0000 mg | INTRAMUSCULAR | Status: DC | PRN
  Administered 2020-03-10: 08:00:00 1 mg via INTRAVENOUS
  Filled 2020-03-07: qty 1

## 2020-03-07 MED ORDER — ACETAMINOPHEN 650 MG RE SUPP: 650.0000 mg | Freq: Four times a day (QID) | RECTAL | Status: DC | PRN

## 2020-03-07 NOTE — Progress Notes (Signed)
  Transition of Care [facility]) - Emergency Department Mini Assessment   Patient Details  Name: Kayla Wilson MRN: [id number] Date of Birth: Jan 14, 1941  [facility] CM/SW Contact:    Erenest Rasher, RN Phone Number:  [phone] 03/09/2020, 6:37 PM   Clinical Narrative: TOC CM referral for [facility] Per Palliative notes, pt's is for admission. Will continue to follow for dc needs.    ED Mini Assessment: What brought you to the Emergency Department? : Altered Mental Status  Barriers to Discharge: Continued Medical Work up        Interventions which prevented an admission or readmission: Other (must enter comment)    Patient Contact and Communications        ,                 Admission diagnosis:  nv uti Patient Active Problem List   Diagnosis Date Noted  . Avulsion, finger tip, subsequent encounter 12/03/2017  . S/P aneurysm repair 12/03/2017  . Hyperlipidemia 11/07/2017  . Hx of hemorrhagic stroke 11/07/2017  . UTI (urinary tract infection) 11/07/2017  . Hypokalemia 11/07/2017  . Thyroid nodule 11/07/2017  . Lung density on x-ray 11/07/2017  . 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 CVA (cerebral vascular accident) ([place]) - small embolic infarcts d/t AF 33/82/5053  . 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 DJD (degenerative joint disease) 09/03/2014  . Angioedema   . Urticaria   . Allergic reaction to serum, anaphylactic shock   . Chronic systolic dysfunction of left ventricle 11/14/2013  . Atrial fibrillation ([place]) 09/26/2013  . UNSPECIFIED VENOUS INSUFFICIENCY 11/21/2008  . PSORIASIS 02/16/2008  . 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 DIVERTICULOSIS, COLON 01/12/2007  . COLONIC POLYPS, HX OF 01/12/2007  . Essential hypertension 11/15/2006   PCP:  Dorothyann Peng, NP Pharmacy:   [facility] [address], [place] - [place] [address] [place] [address] Phone: [phone] Fax:  [phone]

## 2020-03-07 NOTE — Consult Note (Signed)
Consultation Note Date: 03/18/2020   Patient Name: Kayla Wilson  DOB: [birth year]  MRN: [id number]  Age / Sex: 79 y.o., female  PCP: Dorothyann Peng, NP Referring Physician: Marcelyn Bruins, MD  Reason for Consultation: Establishing goals of care; head bleed  HPI/Patient Profile: 80 y.o. female  with past medical history of stroke, HTN, atrial fibrillation, CHF who initially presented to the ED on 03/14/2020 for nausea and vomiting became obtunded while in the waiting room. CT revealed patient had large right occipital parenchymal hemorrhage with intraventricular hemorrhage.   ED Course: In ED vital signs significant for atrial fibrillation, respiratory rate in the 20s, blood pressure in the 814G to 818H systolic.  Lab work-up showed chronic hyponatremia at 129, chronic hypokalemia at 3, glucose 139, normal LFT, normal CBC, normal lipase respiratory for flu and Covid pending.  Neurosurgery consulted in ED who state there is not likely any benefit to neurosurgical intervention and that comfort measures would be appropriate.    Family face treatment option decisions and anticipatory care needs.  Clinical Assessment and Goals of Care: I have reviewed medical records including EPIC notes, labs, and imaging. Received report from primary RN - acute concern of elevated blood pressure.  Went to visit patient at bedside - no family/visitors present. Patient was lying in bed - she did not wake to voice or gentle touch; her eyes are partially open but do not focus; she is not able to participate in conversation. No signs or non-verbal gestures of pain or discomfort noted. No respiratory distress, increased work of breathing, or secretions noted.   Met with about 14 family members in ED conference room  to discuss diagnosis, prognosis, GOC, EOL wishes, disposition, and options.  I introduced Palliative Medicine as  specialized medical care for people living with serious illness. It focuses on providing relief from the symptoms and stress of a serious illness. The goal is to improve quality of life for both the patient and the family.  We discussed a brief life review of the patient as well as functional and nutritional status. The patient's husband, unfortunately, passed away about 1 year ago. They had 7 children together - 6 daughters and 1 son. The patient has many grandchildren and great grandchildren - the family tells me the patient loved to spend time with them. Prior to hospitalization, the patient was living in a private residence with her son, whom was her caregiver. She was able to ambulate with minimal assistance and used a cane when out in public. The family reports the patient has been eating/drinking well up until about 1 week ago, when they noticed a decrease in her appetite. Therapeutic listening was provided as family reflected on the patient's hobbies and joys, such as eating fruitcake, completing crafts, and cooking. Therapeutic listening was also provided as family expressed their thoughts and feelings about the negative experience they had in the ED - encouraged family to speak with ED Director or Surveyor, quantity to notify them of their concerns - family was agreeable  to speak with them.  We discussed patient's current illness and what it means in the larger context of patient's on-going co-morbidities. Family has a clear understanding of the patient's current medical situation. Natural disease trajectory and expectations at EOL were discussed. I attempted to elicit values and goals of care important to the patient. The difference between aggressive medical intervention and comfort care was considered in light of the patient's goals of care. The family were tearful, but explained their goal at this time is to make the patient as comfortable as possible and keep her free from suffering. We talked  about transition to comfort measures in house and what that would entail inclusive of medications to control pain, dyspnea, agitation, nausea, itching, and hiccups. We discussed stopping all uneccessary measures such as blood draws, needle sticks, oxygen, antibiotics, CBGs/insulin, cardiac monitoring, and frequent vital signs. Family were agreeable to initiating comfort measures in house.  Introduced and educated on hospice services and philosophy, including home hospice vs residential hospice. After family discussion, they decided they do not want transfer to residential hospice facility or home hospice; they would like patient to remain in house for EOL care.   Visit also consisted of discussions dealing with the complex and emotionally intense issues of symptom management and palliative care in the setting of serious and life-threatening illness. Palliative care team will continue to support patient, patient's family, and medical team.  Discussed with family the importance of continued conversation with each other and the medical providers regarding overall plan of care and treatment options, ensuring decisions are within the context of the patient's values and GOCs.    Questions and concerns were addressed. The patient/family was encouraged to call with questions and/or concerns. PMT card was provided.  "Gone From My Sight" book left with family.  Primary Decision Maker: NEXT OF KIN    SUMMARY OF RECOMMENDATIONS: Initiated full comfort measures Continue DNR/DNI as previously documented Family does not want transfer to residential hospice facility - will be hospital death; likely hours to days Added orders for EOL symptom management and to reflect full comfort measures, as well as discontinued orders that were not focused on comfort Discontinued clevidipine drip; added metoprolol 23m IV q6hr to help manage elevated BP at EOL. Will likely discontinue tomorrow as I expect patient's BP to  naturally drop as she approaches EOL. Unrestricted visitation orders were placed per current [facility] policy  Provide frequent assessments and administer PRN medications as clinically necessary to ensure EOL comfort PMT will continue to follow holistically  Code Status/Advance Care Planning:  DNR  Palliative Prophylaxis:   Aspiration, Bowel Regimen, Delirium Protocol, Frequent Pain Assessment, Oral Care and Turn Reposition  Additional Recommendations (Limitations, Scope, Preferences):  Full Comfort Care  Psycho-social/Spiritual:   Desire for further Chaplaincy support:no Created space and opportunity for patient and family to express thoughts and feelings regarding patient's current medical situation.   Emotional support and therapeutic listening provided.   Prognosis:   Hours - Days  Discharge Planning: Anticipated Hospital Death      Primary Diagnoses: Present on Admission: **None**   I have reviewed the medical record, interviewed the patient and family, and examined the patient. The following aspects are pertinent.  Past Medical History:  Diagnosis Date  . Atrial fibrillation ([place]   . Colon polyps   . Complication of anesthesia    difficulty awakening  . [place]  . Diverticulosis   . Hypertension   .  IATROGENIC CEREBROVASCULAR INFARCT/HEMORRHAGE NE 03/22/1993   Qualifier: Diagnosis of  By: Leanne Chang MD, [place] per pt's report-Dr Vertell Limber- hemorrhagic stroke. No intervention required    . Stroke [facility])    Social History   Socioeconomic History  . Marital status: Married    Spouse name: Not on file  . Number of children: Not on file  . Years of education: Not on file  . Highest education level: Not on file  Occupational History  . Not on file  Tobacco Use  . Smoking status: Never Smoker  . Smokeless tobacco: Never Used  Substance and Sexual Activity  . Alcohol use: No  . Drug use: No   . Sexual activity: Not Currently  Other Topics Concern  . Not on file  Social History Narrative   Married 58 years    - Seven children 6 girls and one boy.    - three children live in [place] live in [place].       11 grand children and 7 great grandchildren      Worked at [employer] for four years and then started having children       Diet: Fast food and cooks at home      Likes to [employer], flowers and working in the yard, Firefighter.          Social Determinants of Health   Financial Resource Strain: Not on file  Food Insecurity: Not on file  Transportation Needs: Not on file  Physical Activity: Not on file  Stress: Not on file  Social Connections: Not on file   Family History  Problem Relation Age of Onset  . Diabetes Brother   . Heart disease Brother   . Hypertension Brother   . Stroke Mother   . Hypertension Mother   . Heart disease Father        CABG, MI  . Cancer Father        skin   Scheduled Meds: . antiseptic oral rinse  15 mL Topical BID  . metoprolol tartrate  10 mg Intravenous Q6H   Continuous Infusions: PRN Meds:.acetaminophen **OR** acetaminophen, glycopyrrolate, haloperidol **OR** haloperidol **OR** haloperidol lactate, LORazepam, morphine injection, ondansetron **OR** ondansetron (ZOFRAN) IV, polyvinyl alcohol Medications Prior to Admission:  Prior to Admission medications   Medication Sig Start Date End Date Taking? Authorizing Provider  apixaban (ELIQUIS) 5 MG TABS tablet Take 1 tablet (5 mg total) by mouth 2 (two) times daily. 01/07/20  Yes Sherran Needs, NP  carvedilol (COREG) 12.5 MG tablet Take 1 tablet (12.5 mg total) by mouth 2 (two) times daily. 01/07/20  Yes Sherran Needs, NP  hydrochlorothiazide (HYDRODIURIL) 25 MG tablet Take 1 tablet (25 mg total) by mouth daily. Need physical exam for further refills Patient taking differently: Take 25 mg by mouth daily. 02/29/20  Yes Nafziger, Tommi Rumps, NP   vitamin B-12 (CYANOCOBALAMIN) 100 MCG tablet Take 100 mcg by mouth daily.    Yes [provider]   Allergies  Allergen Reactions  . Ace Inhibitors Anaphylaxis    Angioedema, perioperative  . Celebrex [Celecoxib] Itching and Other (See Comments)    Twitching   . Antihistamines, Chlorpheniramine-Type Other (See Comments)    A-fib  . Aspirin Other (See Comments)    History of hemorrhagic stroke  . Cortisone Other (See Comments)    "cortisone shot made me jerk"  . Lidocaine Other (See Comments)    Pt was fine for ~2  days after she received medication and states she then got "sore all over and could not move"  . Other Itching and Other (See Comments)    Environmental allergies- Itchy eyes, runny nose, and congestion  . Phenytoin Swelling and Other (See Comments)    Swelling mostly in arms  . Sulfonamide Derivatives Itching   Review of Systems  Unable to perform ROS: Acuity of condition    Physical Exam Vitals and nursing note reviewed.  Constitutional:      General: She is not in acute distress.    Appearance: She is ill-appearing.     Comments: Frail appearing  Pulmonary:     Effort: No respiratory distress.  Skin:    General: Skin is warm and dry.  Neurological:     Mental Status: She is unresponsive.     Motor: Weakness present.     Vital Signs: BP (!) 156/98   Pulse 96   Temp 97.8 F (36.6 C) (Rectal)   Resp (!) 22   Wt 60 kg   SpO2 98%   BMI 20.11 kg/m  Pain Scale: 0-10   Pain Score: 0-No pain   SpO2: SpO2: 98 % O2 Device:SpO2: 98 % O2 Flow Rate: .   IO: Intake/output summary:   Intake/Output Summary (Last 24 hours) at 03/12/2020 1920 Last data filed at 03/14/2020 1916 Gross per 24 hour  Intake 14.37 ml  Output --  Net 14.37 ml    LBM:   Baseline Weight: Weight: 60 kg Most recent weight: Weight: 60 kg     Palliative Assessment/Data: PPS 10%     Time In: 1715 Time Out: 1830 Time Total: 75 minutes  Greater than 50%  of this  time was spent counseling and coordinating care related to the above assessment and plan.  Signed by: Lin Landsman, NP   Please contact Palliative Medicine Team phone at [phone] for questions and concerns.  For individual provider: See Shea Evans

## 2020-03-07 NOTE — H&P (Signed)
History and Physical   Kayla Wilson VWP:[id number] DOB: [birth year] DOA: 03/04/2020  PCP: Kayla Peng, NP   Patient coming from: Home  Chief Complaint: Altered mental status  HPI: Kayla Wilson is a 79 y.o. female with medical history significant of angioedema, A. fib, CHF, CVA, diverticulosis, hypertension, hyperlipidemia, status post aneurysm repair, venous insufficiency who initially presented to the ED with nausea vomiting and right leg pain and became obtunded while in the ED waiting room.  Per family patient had been awake around 7 AM and was on the couch when she was complaining about right leg pain and some nausea vomiting possibly some urinary issues.  And as above became progressively obtunded while in the waiting room. Patient was sent to CT scanner and found to have large right occipital parenchymal hemorrhage with intraventricular hemorrhage.  ED Course: In ED vital signs significant for atrial fibrillation, respiratory rate in the 20s, blood pressure in the 374M to 270B systolic.  Lab work-up showed chronic hyponatremia at 129, chronic hypokalemia at 3, glucose 139, normal LFT, normal CBC, normal lipase respiratory for flu and Covid pending.  Neurosurgery consulted in ED who state there is not likely any benefit to neurosurgical intervention and that comfort measures would be appropriate.  Palliative care consulted who are putting in orders for comfort care and end-of-life symptom management patient is now DNR.  Review of Systems: Unable to be performed due to patient's mental status  Past Medical History:  Diagnosis Date  . Atrial fibrillation ([place])   . Colon polyps   . Complication of anesthesia    difficulty awakening  . CVA (cerebral vascular accident) ([place]) 11/04/2017  . Diverticulosis   . Hypertension   . IATROGENIC CEREBROVASCULAR INFARCT/HEMORRHAGE NE 03/22/1993   Qualifier: Diagnosis of  By: Leanne Chang MD, [place] per pt's report-Dr Vertell Limber- hemorrhagic stroke. No  intervention required    . Stroke [facility])     Past Surgical History:  Procedure Laterality Date  . APPENDECTOMY    . EYE SURGERY Left    vitrectomy  . FALSE ANEURYSM REPAIR Right 12/03/2017   Procedure: REPAIR OF RIGHT FEMORAL FALSE ANEURYSM;  Surgeon: Rosetta Posner, MD;  Location: [place];  Service: Vascular;  Laterality: Right;  . IR ANGIOGRAM EXTREMITY LEFT  11/05/2017  . IR ARCH CERVICICEBRAL NON-SEL ([place])  11/05/2017  . RADIOLOGY WITH ANESTHESIA N/A 11/05/2017   Procedure: RADIOLOGY WITH ANESTHESIA;  Surgeon: Luanne Bras, MD;  Location: [place];  Service: Radiology;  Laterality: N/A;  . TOTAL HIP ARTHROPLASTY Right 09/03/2014   Procedure: RIGHT TOTAL HIP ARTHROPLASTY ANTERIOR APPROACH;  Surgeon: Renette Butters, MD;  Location: [place];  Service: Orthopedics;  Laterality: Right;  . TUBAL LIGATION      Social History  reports that she has never smoked. She has never used smokeless tobacco. She reports that she does not drink alcohol and does not use drugs.  Allergies  Allergen Reactions  . Ace Inhibitors Anaphylaxis    Angioedema, perioperative  . Celebrex [Celecoxib] Itching and Other (See Comments)    Twitching   . Antihistamines, Chlorpheniramine-Type Other (See Comments)    A-fib  . Aspirin Other (See Comments)    History of hemorrhagic stroke  . Cortisone Other (See Comments)    "cortisone shot made me jerk"  . Lidocaine Other (See Comments)    Pt was fine for ~2 days after she received medication and states she then got "sore all over and could not move"  . Other Itching  and Other (See Comments)    Environmental allergies- Itchy eyes, runny nose, and congestion  . Phenytoin Swelling and Other (See Comments)    Swelling mostly in arms  . Sulfonamide Derivatives Itching    Family History  Problem Relation Age of Onset  . Diabetes Brother   . Heart disease Brother   . Hypertension Brother   . Stroke Mother   . Hypertension Mother   . Heart disease Father         CABG, MI  . Cancer Father        skin  Reviewed on admission, unable to confirm with patient  Prior to Admission medications   Medication Sig Start Date End Date Taking? Authorizing Provider  apixaban (ELIQUIS) 5 MG TABS tablet Take 1 tablet (5 mg total) by mouth 2 (two) times daily. 01/07/20  Yes Sherran Needs, NP  carvedilol (COREG) 12.5 MG tablet Take 1 tablet (12.5 mg total) by mouth 2 (two) times daily. 01/07/20  Yes Sherran Needs, NP  hydrochlorothiazide (HYDRODIURIL) 25 MG tablet Take 1 tablet (25 mg total) by mouth daily. Need physical exam for further refills Patient taking differently: Take 25 mg by mouth daily. 02/29/20  Yes Nafziger, Tommi Rumps, NP  vitamin B-12 (CYANOCOBALAMIN) 100 MCG tablet Take 100 mcg by mouth daily.    Yes [provider]    Physical Exam: Vitals:   03/16/2020 2683 03/11/2020 1800 02/21/2020 1815 03/15/2020 1830  BP: 127/82 (!) 145/89 (!) 154/88 (!) 156/98  Pulse: 100 93 79 96  Resp: 16 (!) 21 20 (!) 22  Temp:      TempSrc:      SpO2: 97% 98% 98% 98%  Weight:       Physical Exam Constitutional:      General: She is not in acute distress.    Appearance: Normal appearance.     Comments: Obtunded, elderly female  HENT:     Head: Normocephalic and atraumatic.     Mouth/Throat:     Mouth: Mucous membranes are moist.     Pharynx: Oropharynx is clear.  Eyes:     Extraocular Movements: Extraocular movements intact.     Pupils: Pupils are equal, round, and reactive to light.  Cardiovascular:     Rate and Rhythm: Normal rate and regular rhythm.     Pulses: Normal pulses.     Heart sounds: Normal heart sounds.  Pulmonary:     Effort: Pulmonary effort is normal. No respiratory distress.     Breath sounds: Normal breath sounds.  Abdominal:     General: Bowel sounds are normal. There is no distension.     Palpations: Abdomen is soft.     Tenderness: There is no abdominal tenderness.  Musculoskeletal:        General: No swelling or deformity.   Skin:    General: Skin is warm and dry.  Neurological:     Comments: Obtunded, unresponsive    Labs on Admission: I have personally reviewed following labs and imaging studies  CBC: Recent Labs  Lab 02/27/2020 1004  WBC 6.3  HGB 13.6  HCT 41.1  MCV 87.6  PLT 419    Basic Metabolic Panel: Recent Labs  Lab 03/15/2020 1004  NA 129*  K 3.0*  CL 92*  CO2 27  GLUCOSE 135*  BUN 5*  CREATININE 0.71  CALCIUM 9.8   GFR: Estimated Creatinine Clearance: 54 mL/min (by C-G formula based on SCr of 0.71 mg/dL).  Liver Function Tests: Recent Labs  Lab 03/11/2020 1004  AST 26  ALT 14  ALKPHOS 49  BILITOT 1.1  PROT 8.0  ALBUMIN 3.9    Urine analysis:    Component Value Date/Time   COLORURINE YELLOW 08/20/2018 1708   APPEARANCEUR HAZY (A) 08/20/2018 1708   LABSPEC 1.016 08/20/2018 1708   PHURINE 7.0 08/20/2018 1708   GLUCOSEU NEGATIVE 08/20/2018 1708   HGBUR MODERATE (A) 08/20/2018 1708   HGBUR trace-intact 11/13/2008 0902   BILIRUBINUR negative 08/28/2018 1425   KETONESUR 5 (A) 08/20/2018 1708   PROTEINUR Negative 08/28/2018 1425   PROTEINUR NEGATIVE 08/20/2018 1708   UROBILINOGEN 0.2 08/28/2018 1425   UROBILINOGEN 0.2 08/23/2014 1150   NITRITE negative 08/28/2018 1425   NITRITE NEGATIVE 08/20/2018 1708   LEUKOCYTESUR Negative 08/28/2018 1425   LEUKOCYTESUR MODERATE (A) 08/20/2018 1708    Radiological Exams on Admission: CT Head Wo Contrast  Result Date: 03/21/2020 CLINICAL DATA:  Mental status change, unknown cause. EXAM: CT HEAD WITHOUT CONTRAST TECHNIQUE: Contiguous axial images were obtained from the base of the skull through the vertex without intravenous contrast. COMPARISON:  11/05/2017 and prior. FINDINGS: Brain: Intraparenchymal hemorrhage measuring 2.7 x 2.5 x 2.7 cm centered within the right periatrial white matter. Intraventricular extension of hemorrhage involving the right greater than left lateral, third and fourth ventricles. No midline shift. Diffuse  parenchymal volume loss with ex vacuo dilatation. Background chronic microvascular ischemic changes. Sequela of remote right frontoparietal insults. Vascular: No hyperdense vessel or unexpected calcification. Bilateral skull base atherosclerotic calcifications. Skull: Negative for fracture or focal lesion. Sinuses/Orbits: No acute orbital finding. Pneumatized paranasal sinuses and mastoid air cells. Other: None. IMPRESSION: Right occipital intraparenchymal hemorrhage with intraventricular extension. Remote right frontoparietal insults. Cerebral atrophy and chronic microvascular ischemic changes. These results were called by telephone at the time of interpretation on 03/09/2020 at 1:11 pm to provider ADAM CURATOLO , who verbally acknowledged these results. Electronically Signed   By: Primitivo Gauze M.D.   On: 03/01/2020 13:21   DG Chest Portable 1 View  Result Date: 02/21/2020 CLINICAL DATA:  Altered mental status. EXAM: PORTABLE CHEST 1 VIEW COMPARISON:  Chest x-ray dated December 03, 2017. FINDINGS: The patient is rotated to the left. Unchanged mild cardiomegaly. 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 Normal pulmonary vascularity. No focal consolidation, pleural effusion, or pneumothorax. No acute osseous abnormality. IMPRESSION: No active disease. Electronically Signed   By: Titus Dubin M.D.   On: 03/09/2020 13:26   EKG: Independently reviewed.  Atrial fibrillation at 78 beats minute  Assessment/Plan Principal Problem:   Intraventricular hemorrhage (HCC) Active Problems:   Obtunded   Encephalopathy  Encephalopathy Intraventricular hemorrhage > Large right occipital intraparenchymal and intraventricular hemorrhage extending all 4 ventricles > Evaluated by neurosurgery who states there is unlikely to be any benefit from neurosurgical intervention after conversation patient was placed on comfort measures. > Palliative  care consulted, unable to arrange home hospice, patient to be admitted as a comfort care patient - Appreciate palliative care's assistance with this patient - Comfort care orders per palliative care - Expected in hospital death in hours to days  Angioedema, A. fib, CHF, CVA, diverticulosis, hypertension, hyperlipidemia, status post aneurysm repair, venous insufficiency - Holding home meds due to comfort care status This is a level 1 level 2 DVT prophylaxis: Comfort care, none Code Status:   DNR Family Communication:  Discussed with multiple family members present at bedside  Disposition Plan:   Patient is from:  Home  Anticipated DC to:  In hospital death  Anticipated DC date:  Hours to days  Anticipated DC barriers: In hospital death expected  Consults called:  Palliative care and neurosurgery consulted by EDP Admission status:  Observation, MedSurg  Severity of Illness: The appropriate patient status for this patient is OBSERVATION. Observation status is judged to be reasonable and necessary in order to provide the required intensity of service to ensure the patient's safety. The patient's presenting symptoms, physical exam findings, and initial radiographic and laboratory data in the context of their medical condition is felt to place them at decreased risk for further clinical deterioration. Furthermore, it is anticipated that the patient will be medically stable for discharge from the hospital within 2 midnights of admission. The following factors support the patient status of observation.   " The patient's presenting symptoms include obtundation. " The physical exam findings include obtundation. " The initial radiographic and laboratory data are presence of large intraventricular and intraparenchymal hemorrhage on CT.   Marcelyn Bruins MD [facility]  How to contact the [facility] Attending or Consulting provider [place] or covering provider during after hours [place], for this  patient?   1. Check the care team in [facility] and look for a) attending/consulting [facility] provider listed and b) the [facility] team listed 2. Log into [email or web address] and use [place]'s universal password to access. If you do not have the password, please contact the hospital operator. 3. Locate the [facility] provider you are looking for under [facility] and page to a number that you can be directly reached. 4. If you still have difficulty reaching the provider, please page the [facility] (Director on Call) for the Hospitalists listed on amion for assistance.  03/18/2020, 7:03 PM

## 2020-03-07 NOTE — ED Notes (Signed)
Paused cliviprex

## 2020-03-07 NOTE — ED Provider Notes (Signed)
Signout from Dr. Ronnald Nian.  79 year old female here with headache altered mental status found to have large intraparenchymal hemorrhage.  She is pending evaluation by palliative to see if we can get her home hospice.  Otherwise may need to be admitted for comfort measures. Physical Exam  BP 127/82   Pulse 100   Temp 97.8 F (36.6 C) (Rectal)   Resp 16   Wt 60 kg   SpO2 97%   BMI 20.11 kg/m   Physical Exam  ED Course/Procedures     Procedures  MDM  Palliative care get back to me.  They had extensive conversation with family and recommend admission to the hospital on [place] for expectant death within hours to a few days.  Discussed with [facility] hospitalist Dr. Trilby Drummer who will evaluate the patient for admission.       Hayden Rasmussen, MD 02/28/2020 [phone]

## 2020-03-07 NOTE — ED Provider Notes (Signed)
[facility] EMERGENCY DEPARTMENT Provider Note   CSN: [id number] Arrival date & time: 03/01/2020  3557     History Chief Complaint  Patient presents with  . Altered Mental Status    Kayla Wilson is a 79 y.o. female.  Level 5 caveat due to altered mental status.  According to daughter patient woke up this morning around 7 AM sitting on the couch was complained of right leg pain and nausea and vomiting.  May be some pain with urination.  She did not complain of a headache.  While they were in the waiting room patient became more more somnolent and stopped talking.  She did not think there is any history of fall.  Patient is on a blood thinner Eliquis.  The history is provided by a relative.  Altered Mental Status Presenting symptoms: confusion and partial responsiveness   Severity:  Mild Most recent episode:  Today Episode history:  Single Timing:  Constant Progression:  Worsening Chronicity:  New Context: not dementia        Past Medical History:  Diagnosis Date  . Atrial fibrillation ([place])   . Colon polyps   . Complication of anesthesia    difficulty awakening  . CVA (cerebral vascular accident) ([place]) 11/04/2017  . Diverticulosis   . Hypertension   . IATROGENIC CEREBROVASCULAR INFARCT/HEMORRHAGE NE 03/22/1993   Qualifier: Diagnosis of  By: Leanne Chang MD, [place] per pt's report-Dr Vertell Limber- hemorrhagic stroke. No intervention required    . Stroke [facility])     Patient Active Problem List   Diagnosis Date Noted  . Avulsion, finger tip, subsequent encounter 12/03/2017  . S/P aneurysm repair 12/03/2017  . Hyperlipidemia 11/07/2017  . Hx of hemorrhagic stroke 11/07/2017  . UTI (urinary tract infection) 11/07/2017  . Hypokalemia 11/07/2017  . Thyroid nodule 11/07/2017  . Lung density on x-ray 11/07/2017  . 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 CVA (cerebral vascular accident) ([place]) - small embolic infarcts d/t AF 32/20/2542  . 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 DJD (degenerative joint disease) 09/03/2014  . Angioedema   . Urticaria   . Allergic reaction to serum, anaphylactic shock   . Chronic systolic dysfunction of left ventricle 11/14/2013  . Atrial fibrillation ([place]) 09/26/2013  . UNSPECIFIED VENOUS INSUFFICIENCY 11/21/2008  . PSORIASIS 02/16/2008  . 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 DIVERTICULOSIS, COLON 01/12/2007  . COLONIC POLYPS, HX OF 01/12/2007  . Essential hypertension 11/15/2006    Past Surgical History:  Procedure Laterality Date  . APPENDECTOMY    . EYE SURGERY Left    vitrectomy  . FALSE ANEURYSM REPAIR Right 12/03/2017   Procedure: REPAIR OF RIGHT FEMORAL FALSE ANEURYSM;  Surgeon: Rosetta Posner, MD;  Location: [place];  Service: Vascular;  Laterality: Right;  . IR ANGIOGRAM EXTREMITY LEFT  11/05/2017  . IR ARCH CERVICICEBRAL NON-SEL (MS)  11/05/2017  . RADIOLOGY WITH ANESTHESIA N/A 11/05/2017   Procedure: RADIOLOGY WITH ANESTHESIA;  Surgeon: Luanne Bras, MD;  Location: [facility] [place];  Service: Radiology;  Laterality: N/A;  . TOTAL HIP ARTHROPLASTY Right 09/03/2014   Procedure: RIGHT TOTAL HIP ARTHROPLASTY ANTERIOR APPROACH;  Surgeon: Renette Butters, MD;  Location: [place];  Service: Orthopedics;  Laterality: Right;  . TUBAL LIGATION       OB History   No obstetric history on file.     Family History  Problem Relation Age of Onset  . Diabetes Brother   . Heart disease Brother   .  Hypertension Brother   . Stroke Mother   . Hypertension Mother   . Heart disease Father        CABG, MI  . Cancer Father        skin    Social History   Tobacco Use  . Smoking status: Never Smoker  . Smokeless tobacco: Never Used  Substance Use Topics  . Alcohol use: No  . Drug use: No    Home Medications Prior to Admission medications   Medication Sig Start Date End Date Taking? Authorizing Provider  apixaban (ELIQUIS) 5 MG TABS tablet Take 1 tablet (5 mg total) by mouth 2 (two)  times daily. 01/07/20   Sherran Needs, NP  carvedilol (COREG) 12.5 MG tablet Take 1 tablet (12.5 mg total) by mouth 2 (two) times daily. 01/07/20   Sherran Needs, NP  hydrochlorothiazide (HYDRODIURIL) 25 MG tablet Take 1 tablet (25 mg total) by mouth daily. Need physical exam for further refills 02/29/20   Nafziger, Tommi Rumps, NP  vitamin B-12 (CYANOCOBALAMIN) 100 MCG tablet Take 100 mcg by mouth daily.     [provider]    Allergies    Ace inhibitors; Celebrex [celecoxib]; Antihistamines, chlorpheniramine-type; Aspirin; Cortisone; Lidocaine; Phenytoin; and Sulfonamide derivatives  Review of Systems   Review of Systems  Unable to perform ROS: Mental status change  Psychiatric/Behavioral: Positive for confusion.    Physical Exam Updated Vital Signs  ED Triage Vitals  Enc Vitals Group     BP 03/17/2020 1006 (!) 185/97     Pulse Rate 02/26/2020 1006 (!) 104     Resp 03/05/2020 1006 17     Temp 03/05/2020 1006 98 F (36.7 C)     Temp Source 03/12/2020 1006 Oral     SpO2 02/22/2020 1006 99 %     Weight 02/26/2020 1300 132 lb 4.4 oz (60 kg)     Height --      Head Circumference --      Peak Flow --      Pain Score 03/06/2020 1000 0     Pain Loc --      Pain Edu? --      Excl. in [place]? --     Physical Exam Constitutional:      General: She is in acute distress.     Appearance: She is ill-appearing.  HENT:     Head: Normocephalic and atraumatic.     Nose: Nose normal.     Mouth/Throat:     Mouth: Mucous membranes are moist.  Eyes:     Pupils: Pupils are equal, round, and reactive to light.  Cardiovascular:     Pulses: Normal pulses.  Pulmonary:     Effort: Pulmonary effort is normal.     Breath sounds: No stridor. No wheezing or rhonchi.  Abdominal:     Tenderness: There is no abdominal tenderness.     Hernia: A hernia (reducible) is present.  Musculoskeletal:        General: No deformity.     Cervical back: Neck supple.     Right lower leg: No edema.     Left lower leg:  No edema.  Skin:    General: Skin is warm.     Capillary Refill: Capillary refill takes less than 2 seconds.  Neurological:     GCS: GCS eye subscore is 1. GCS verbal subscore is 2. GCS motor subscore is 4.     ED Results / Procedures / Treatments   Labs (all labs  ordered are listed, but only abnormal results are displayed) Labs Reviewed  COMPREHENSIVE METABOLIC PANEL - Abnormal; Notable for the following components:      Result Value   Sodium 129 (*)    Potassium 3.0 (*)    Chloride 92 (*)    Glucose, Bld 135 (*)    BUN 5 (*)    All other components within normal limits  CBG MONITORING, ED - Abnormal; Notable for the following components:   Glucose-Capillary 103 (*)    All other components within normal limits  URINE CULTURE  CULTURE, BLOOD (ROUTINE X 2)  CULTURE, BLOOD (ROUTINE X 2)  RESP PANEL BY RT-PCR (FLU A&B, COVID) ARPGX2  LIPASE, BLOOD  CBC  URINALYSIS, ROUTINE W REFLEX MICROSCOPIC  LACTIC ACID, PLASMA  LACTIC ACID, PLASMA  AMMONIA  TROPONIN I (HIGH SENSITIVITY)    EKG EKG Interpretation  Date/Time:  Friday March 07 2020 12:38:56 EST Ventricular Rate:  78 PR Interval:    QRS Duration: 97 QT Interval:  390 QTC Calculation: 445 R Axis:   44 Text Interpretation: Atrial fibrillation Confirmed by Lennice Sites [phone]) on 02/23/2020 12:45:05 PM   Radiology CT Head Wo Contrast  Result Date: 03/06/2020 CLINICAL DATA:  Mental status change, unknown cause. EXAM: CT HEAD WITHOUT CONTRAST TECHNIQUE: Contiguous axial images were obtained from the base of the skull through the vertex without intravenous contrast. COMPARISON:  11/05/2017 and prior. FINDINGS: Brain: Intraparenchymal hemorrhage measuring 2.7 x 2.5 x 2.7 cm centered within the right periatrial white matter. Intraventricular extension of hemorrhage involving the right greater than left lateral, third and fourth ventricles. No midline shift. Diffuse parenchymal volume loss with ex vacuo dilatation.  Background chronic microvascular ischemic changes. Sequela of remote right frontoparietal insults. Vascular: No hyperdense vessel or unexpected calcification. Bilateral skull base atherosclerotic calcifications. Skull: Negative for fracture or focal lesion. Sinuses/Orbits: No acute orbital finding. Pneumatized paranasal sinuses and mastoid air cells. Other: None. IMPRESSION: Right occipital intraparenchymal hemorrhage with intraventricular extension. Remote right frontoparietal insults. Cerebral atrophy and chronic microvascular ischemic changes. These results were called by telephone at the time of interpretation on 02/23/2020 at 1:11 pm to provider Danyella Mcginty , who verbally acknowledged these results. Electronically Signed   By: Primitivo Gauze M.D.   On: 03/11/2020 13:21   DG Chest Portable 1 View  Result Date: 03/15/2020 CLINICAL DATA:  Altered mental status. EXAM: PORTABLE CHEST 1 VIEW COMPARISON:  Chest x-ray dated December 03, 2017. FINDINGS: The patient is rotated to the left. Unchanged mild cardiomegaly. 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 Normal pulmonary vascularity. No focal consolidation, pleural effusion, or pneumothorax. No acute osseous abnormality. IMPRESSION: No active disease. Electronically Signed   By: Titus Dubin M.D.   On: 02/25/2020 13:26    Procedures .Critical Care Performed by: Lennice Sites, DO Authorized by: Lennice Sites, DO   Critical care provider statement:    Critical care time (minutes):  45   Critical care was necessary to treat or prevent imminent or life-threatening deterioration of the following conditions:  CNS failure or compromise   Critical care was time spent personally by me on the following activities:  Blood draw for specimens, development of treatment plan with patient or surrogate, discussions with primary provider, evaluation of patient's response to treatment,  examination of patient, obtaining history from patient or surrogate, ordering and performing treatments and interventions, ordering and review of laboratory studies, ordering and review of radiographic studies, pulse  oximetry, re-evaluation of patient's condition and review of old charts   I assumed direction of critical care for this patient from another provider in my specialty: no     (including critical care time)  Medications Ordered in ED Medications  ondansetron (ZOFRAN) injection 4 mg (has no administration in time range)  prothrombin complex conc human (KCENTRA) IVPB 3,194 Units (3,194 Units Intravenous New Bag/Given 02/20/2020 1340)  clevidipine (CLEVIPREX) infusion 0.5 mg/mL (0 mg/hr Intravenous Paused 03/18/2020 1335)    ED Course  I have reviewed the triage vital signs and the nursing notes.  Pertinent labs & imaging results that were available during my care of the patient were reviewed by me and considered in my medical decision making (see chart for details).    MDM Rules/Calculators/A&P                          VINA BYRD is a 79 year old female who presents to the ED with nausea, vomiting, pain with urination.  Patient per triage note was somnolent but able to talk and answer questions.  Upon my evaluation patient was brought back from waiting room as she became more altered and less responsive.  Vital signs showed slightly elevated blood pressure in the 180s but otherwise normal vitals.  No fever.  Patient is obtunded.  She intermittently follows commands.  She garbled his words.  Pupils are equal and reactive.  Does have a history of stroke in the past.  May be a history of remote seizures in the past.  Lab work has already been done that shows no significant anemia, electrolyte abnormality, kidney injury.  Rectal temperature was normal.  Concern for head bleed as patient is on Eliquis.  Family denies any trauma.  Was last totally normal may be last night.  Family woke up and  she was sitting on a chair complaining of right leg pain and nausea and vomiting.  CT scan shows large intracranial and intraventricular head bleed.  There is extension from the parenchyma into the intraventricular space.  No shift.  Patient was given Kcentra and started on Cleviprex for blood pressure control.  Talked with Dr. Ellene Route with neurosurgery who will come talk with the family.  After my discussion with the family we have made her DNR and DNI.  We are possibly pursuing comfort care but at this time we will continue to check labs and control blood pressure.  However this is likely to be a terminal event or lead to severe disability.  I have talked with case management who will touch base with palliative care and talk with family about their wishes.  It seems that family wishes to have hospice at home if we are able to arrange that.  Right now we will continue to keep patient on blood pressure medication and we have not confirmed comfort care yet at this time.  Patient signed out to oncoming ED staff with patient pending palliative care consultation, goals of care w/ neurosurgery still to the patient as well.  This chart was dictated using voice recognition software.  Despite best efforts to proofread,  errors can occur which can change the documentation meaning.    Final Clinical Impression(s) / ED Diagnoses Final diagnoses:  Intracranial hemorrhage [facility])    Rx / DC Orders ED Discharge Orders    None       Lennice Sites, DO 02/28/2020 1506

## 2020-03-07 NOTE — ED Triage Notes (Signed)
Pt bib ems from home with NV onset 3 hours ago along with painful urination onset last night. Pt somnolent on arrival to triage. 184/110, HR 80's afib. Hx UTI.

## 2020-03-07 NOTE — ED Notes (Signed)
Returned from CT with Pt, niow starting IV

## 2020-03-07 NOTE — ED Notes (Signed)
Continue to keep close watch and systolic within range

## 2020-03-07 NOTE — Consult Note (Signed)
Reason for Consult: Intracerebral intraventricular hemorrhage Referring Physician: Dr. Eric Form Kayla Wilson is an 79 y.o. female.  HPI: Patient is a 79 year old left-handed individual whose had a history of strokes in the past.  She presented today with a large intraventricular hemorrhage with intracerebral extension from the region of the thalamus.  Patient had become progressively obtunded and she was brought to the emergency room for further work-up.  At this point she has casted all 4 ventricles with blood.  She is very somnolent but will arouse to painful stimuli.  Discussion was had with the family regarding the aggressiveness of treatment and has been decided that comfort care would be best provided for the patient.  Patient has been on chronic anticoagulation for atrial fibrillation.  Past Medical History:  Diagnosis Date  . Atrial fibrillation ([place])   . Colon polyps   . Complication of anesthesia    difficulty awakening  . CVA (cerebral vascular accident) ([place]) 11/04/2017  . Diverticulosis   . Hypertension   . IATROGENIC CEREBROVASCULAR INFARCT/HEMORRHAGE NE 03/22/1993   Qualifier: Diagnosis of  By: Leanne Chang MD, [place] per pt's report-Dr Vertell Limber- hemorrhagic stroke. No intervention required    . Stroke [facility])     Past Surgical History:  Procedure Laterality Date  . APPENDECTOMY    . EYE SURGERY Left    vitrectomy  . FALSE ANEURYSM REPAIR Right 12/03/2017   Procedure: REPAIR OF RIGHT FEMORAL FALSE ANEURYSM;  Surgeon: Rosetta Posner, MD;  Location: [place];  Service: Vascular;  Laterality: Right;  . IR ANGIOGRAM EXTREMITY LEFT  11/05/2017  . IR ARCH CERVICICEBRAL NON-SEL ([place])  11/05/2017  . RADIOLOGY WITH ANESTHESIA N/A 11/05/2017   Procedure: RADIOLOGY WITH ANESTHESIA;  Surgeon: Luanne Bras, MD;  Location: [facility];  Service: Radiology;  Laterality: N/A;  . TOTAL HIP ARTHROPLASTY Right 09/03/2014   Procedure: RIGHT TOTAL HIP ARTHROPLASTY ANTERIOR APPROACH;  Surgeon: Renette Butters, MD;  Location: [facility];  Service: Orthopedics;  Laterality: Right;  . TUBAL LIGATION      Family History  Problem Relation Age of Onset  . Diabetes Brother   . Heart disease Brother   . Hypertension Brother   . Stroke Mother   . Hypertension Mother   . Heart disease Father        CABG, MI  . Cancer Father        skin    Social History:  reports that she has never smoked. She has never used smokeless tobacco. She reports that she does not drink alcohol and does not use drugs.  Allergies:  Allergies  Allergen Reactions  . Ace Inhibitors Anaphylaxis    Angioedema, perioperative  . Celebrex [Celecoxib] Itching and Other (See Comments)    Twitching   . Antihistamines, Chlorpheniramine-Type Other (See Comments)    A-fib  . Aspirin Other (See Comments)    History of hemorrhagic stroke  . Cortisone Other (See Comments)    "cortisone shot made me jerk"  . Lidocaine Other (See Comments)    Pt was fine for ~2 days after she received medication and states she then got "sore all over and could not move"  . Other Itching and Other (See Comments)    Environmental allergies- Itchy eyes, runny nose, and congestion  . Phenytoin Swelling and Other (See Comments)    Swelling mostly in arms  . Sulfonamide Derivatives Itching    Medications: I have reviewed the patient's current medications.  Results for orders placed or performed  during the hospital encounter of 03/20/2020 (from the past 48 hour(s))  Lipase, blood     Status: None   Collection Time: 03/17/2020 10:04 AM  Result Value Ref Range   Lipase 22 11 - 51 U/L    Comment: Performed at [facility], [address] [place], [place] [address]  Comprehensive metabolic panel     Status: Abnormal   Collection Time: 02/29/2020 10:04 AM  Result Value Ref Range   Sodium 129 (L) 135 - 145 mmol/L   Potassium 3.0 (L) 3.5 - 5.1 mmol/L   Chloride 92 (L) 98 - 111 mmol/L   CO2 27 22 - 32 mmol/L   Glucose, Bld 135 (H) 70 - 99 mg/dL     Comment: Glucose reference range applies only to samples taken after fasting for at least 8 hours.   BUN 5 (L) 8 - 23 mg/dL   Creatinine, Ser 0.71 0.44 - 1.00 mg/dL   Calcium 9.8 8.9 - 10.3 mg/dL   Total Protein 8.0 6.5 - 8.1 g/dL   Albumin 3.9 3.5 - 5.0 g/dL   AST 26 15 - 41 U/L   ALT 14 0 - 44 U/L   Alkaline Phosphatase 49 38 - 126 U/L   Total Bilirubin 1.1 0.3 - 1.2 mg/dL   GFR, Estimated >60 >60 mL/min    Comment: (NOTE) Calculated using the CKD-EPI Creatinine Equation (2021)    Anion gap 10 5 - 15    Comment: Performed at [facility] [address], [place] [address]  CBC     Status: None   Collection Time: 03/04/2020 10:04 AM  Result Value Ref Range   WBC 6.3 4.0 - 10.5 K/uL   RBC 4.69 3.87 - 5.11 MIL/uL   Hemoglobin 13.6 12.0 - 15.0 g/dL   HCT 41.1 36.0 - 46.0 %   MCV 87.6 80.0 - 100.0 fL   MCH 29.0 26.0 - 34.0 pg   MCHC 33.1 30.0 - 36.0 g/dL   RDW 13.5 11.5 - 15.5 %   Platelets 282 150 - 400 K/uL   nRBC 0.0 0.0 - 0.2 %    Comment: Performed at [facility], [place] [address], [place], [place] [address]  POC CBG, ED     Status: Abnormal   Collection Time: 03/21/2020 12:45 PM  Result Value Ref Range   Glucose-Capillary 103 (H) 70 - 99 mg/dL    Comment: Glucose reference range applies only to samples taken after fasting for at least 8 hours.    CT Head Wo Contrast  Result Date: 03/17/2020 CLINICAL DATA:  Mental status change, unknown cause. EXAM: CT HEAD WITHOUT CONTRAST TECHNIQUE: Contiguous axial images were obtained from the base of the skull through the vertex without intravenous contrast. COMPARISON:  11/05/2017 and prior. FINDINGS: Brain: Intraparenchymal hemorrhage measuring 2.7 x 2.5 x 2.7 cm centered within the right periatrial white matter. Intraventricular extension of hemorrhage involving the right greater than left lateral, third and fourth ventricles. No midline shift. Diffuse parenchymal volume loss with ex vacuo dilatation. Background  chronic microvascular ischemic changes. Sequela of remote right frontoparietal insults. Vascular: No hyperdense vessel or unexpected calcification. Bilateral skull base atherosclerotic calcifications. Skull: Negative for fracture or focal lesion. Sinuses/Orbits: No acute orbital finding. Pneumatized paranasal sinuses and mastoid air cells. Other: None. IMPRESSION: Right occipital intraparenchymal hemorrhage with intraventricular extension. Remote right frontoparietal insults. Cerebral atrophy and chronic microvascular ischemic changes. These results were called by telephone at the time of interpretation on  03/03/2020 at 1:11 pm to provider ADAM CURATOLO , who verbally acknowledged these results. Electronically Signed   By: Primitivo Gauze M.D.   On: 02/28/2020 13:21   DG Chest Portable 1 View  Result Date: 03/21/2020 CLINICAL DATA:  Altered mental status. EXAM: PORTABLE CHEST 1 VIEW COMPARISON:  Chest x-ray dated December 03, 2017. FINDINGS: The patient is rotated to the left. Unchanged mild cardiomegaly. 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 Normal pulmonary vascularity. No focal consolidation, pleural effusion, or pneumothorax. No acute osseous abnormality. IMPRESSION: No active disease. Electronically Signed   By: Titus Dubin M.D.   On: 02/24/2020 13:26    Review of Systems  Unable to perform ROS: Acuity of condition   Blood pressure (!) 154/88, pulse 79, temperature 97.8 F (36.6 C), temperature source Rectal, resp. rate 20, weight 60 kg, SpO2 98 %. Physical Exam Constitutional:      Appearance: She is normal weight.  HENT:     Head: Normocephalic and atraumatic.     Right Ear: Tympanic membrane normal.     Left Ear: Tympanic membrane normal.     Nose: Nose normal.     Mouth/Throat:     Mouth: Mucous membranes are moist.  Eyes:     Extraocular Movements: Extraocular movements intact.     Pupils: Pupils are  equal, round, and reactive to light.  Cardiovascular:     Rate and Rhythm: Normal rate and regular rhythm.     Pulses: Normal pulses.     Heart sounds: Normal heart sounds.  Pulmonary:     Effort: Pulmonary effort is normal.     Breath sounds: Normal breath sounds.  Musculoskeletal:     Cervical back: Neck supple.  Neurological:     Comments: Patient is severely obtunded and stuporous.  She will respond to painful stimulation with occasional groans and one-word answers.  Her pupils are 3 mm and sluggishly reactive to light.  Extraocular movements are full.  She does have some spontaneous movement in her upper extremities bilaterally.     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  He has received Kcentra.  Treatment plan will be conservative care with simple supportive measures.  I do not feel surgical intervention is likely to impact her overall outcome.  Blanchie Dessert Maddux [place] 03/13/2020, 6:30 PM

## 2020-03-08 DIAGNOSIS — I615 Nontraumatic intracerebral hemorrhage, intraventricular: Secondary | ICD-10-CM | POA: Diagnosis not present

## 2020-03-08 DIAGNOSIS — G9349 Other encephalopathy: Secondary | ICD-10-CM | POA: Diagnosis present

## 2020-03-08 DIAGNOSIS — Z20822 Contact with and (suspected) exposure to covid-19: Secondary | ICD-10-CM | POA: Diagnosis present

## 2020-03-08 DIAGNOSIS — Z882 Allergy status to sulfonamides status: Secondary | ICD-10-CM | POA: Diagnosis not present

## 2020-03-08 DIAGNOSIS — Z823 Family history of stroke: Secondary | ICD-10-CM | POA: Diagnosis not present

## 2020-03-08 DIAGNOSIS — Z8673 Personal history of transient ischemic attack (TIA), and cerebral infarction without residual deficits: Secondary | ICD-10-CM | POA: Diagnosis not present

## 2020-03-08 DIAGNOSIS — Z888 Allergy status to other drugs, medicaments and biological substances status: Secondary | ICD-10-CM | POA: Diagnosis not present

## 2020-03-08 DIAGNOSIS — M79604 Pain in right leg: Secondary | ICD-10-CM | POA: Diagnosis present

## 2020-03-08 DIAGNOSIS — I629 Nontraumatic intracranial hemorrhage, unspecified: Secondary | ICD-10-CM | POA: Diagnosis present

## 2020-03-08 DIAGNOSIS — I110 Hypertensive heart disease with heart failure: Secondary | ICD-10-CM | POA: Diagnosis present

## 2020-03-08 DIAGNOSIS — Z833 Family history of diabetes mellitus: Secondary | ICD-10-CM | POA: Diagnosis not present

## 2020-03-08 DIAGNOSIS — R401 Stupor: Secondary | ICD-10-CM | POA: Diagnosis not present

## 2020-03-08 DIAGNOSIS — E785 Hyperlipidemia, unspecified: Secondary | ICD-10-CM | POA: Diagnosis present

## 2020-03-08 DIAGNOSIS — Z96641 Presence of right artificial hip joint: Secondary | ICD-10-CM | POA: Diagnosis present

## 2020-03-08 DIAGNOSIS — Z886 Allergy status to analgesic agent status: Secondary | ICD-10-CM | POA: Diagnosis not present

## 2020-03-08 DIAGNOSIS — E876 Hypokalemia: Secondary | ICD-10-CM | POA: Diagnosis present

## 2020-03-08 DIAGNOSIS — Z515 Encounter for palliative care: Secondary | ICD-10-CM | POA: Diagnosis not present

## 2020-03-08 DIAGNOSIS — Z66 Do not resuscitate: Secondary | ICD-10-CM | POA: Diagnosis present

## 2020-03-08 DIAGNOSIS — G9340 Encephalopathy, unspecified: Secondary | ICD-10-CM | POA: Diagnosis not present

## 2020-03-08 DIAGNOSIS — I509 Heart failure, unspecified: Secondary | ICD-10-CM | POA: Diagnosis present

## 2020-03-08 DIAGNOSIS — Z8249 Family history of ischemic heart disease and other diseases of the circulatory system: Secondary | ICD-10-CM | POA: Diagnosis not present

## 2020-03-08 DIAGNOSIS — Z8719 Personal history of other diseases of the digestive system: Secondary | ICD-10-CM | POA: Diagnosis not present

## 2020-03-08 DIAGNOSIS — E871 Hypo-osmolality and hyponatremia: Secondary | ICD-10-CM | POA: Diagnosis present

## 2020-03-08 DIAGNOSIS — I872 Venous insufficiency (chronic) (peripheral): Secondary | ICD-10-CM | POA: Diagnosis present

## 2020-03-08 DIAGNOSIS — I611 Nontraumatic intracerebral hemorrhage in hemisphere, cortical: Secondary | ICD-10-CM | POA: Diagnosis present

## 2020-03-08 DIAGNOSIS — Z884 Allergy status to anesthetic agent status: Secondary | ICD-10-CM | POA: Diagnosis not present

## 2020-03-08 DIAGNOSIS — I4821 Permanent atrial fibrillation: Secondary | ICD-10-CM | POA: Diagnosis present

## 2020-03-08 MED ORDER — MORPHINE 100MG IN NS 100ML (1MG/ML) PREMIX INFUSION
2.0000 mg/h | INTRAVENOUS | Status: AC
  Administered 2020-03-08 – 2020-03-09 (×2): 0.5 mg/h via INTRAVENOUS
  Filled 2020-03-08: qty 100

## 2020-03-08 MED ORDER — MORPHINE BOLUS VIA INFUSION
1.0000 mg | INTRAVENOUS | Status: DC | PRN
  Administered 2020-03-09 (×2): 2 mg via INTRAVENOUS
  Administered 2020-03-09: 1 mg via INTRAVENOUS
  Administered 2020-03-09 (×2): 2 mg via INTRAVENOUS
  Administered 2020-03-09 (×2): 1 mg via INTRAVENOUS
  Administered 2020-03-09 – 2020-03-10 (×5): 2 mg via INTRAVENOUS
  Filled 2020-03-08: qty 2

## 2020-03-08 MED ORDER — DIPHENHYDRAMINE HCL 50 MG/ML IJ SOLN
25.0000 mg | INTRAMUSCULAR | Status: DC | PRN
  Administered 2020-03-08: 19:00:00 25 mg via INTRAVENOUS
  Filled 2020-03-08: qty 1

## 2020-03-08 MED ORDER — METOPROLOL TARTRATE 5 MG/5ML IV SOLN
5.0000 mg | Freq: Four times a day (QID) | INTRAVENOUS | Status: DC
  Administered 2020-03-08: 08:00:00 5 mg via INTRAVENOUS
  Filled 2020-03-08: qty 5

## 2020-03-08 NOTE — ED Notes (Signed)
Cardiac monitoring turned off per MD orders. Will take VS per family request and at 0500 today.

## 2020-03-08 NOTE — Progress Notes (Signed)
PROGRESS NOTE   Kayla Wilson  BPZ:[id number] DOB: [birth year] DOA: 03/14/2020 PCP: Dorothyann Peng, NP  Brief Narrative:  79 year old white female known history of angioedema A. fib heart failure diverticulosis multiple others admitted with right leg pain nausea vomiting and became an obtunded Found to have large right occipital parenchymal hemorrhage with intraventricular hemorrhage Dr. Ellene Route five neurosurgery can consulted by ED and felt conservative measures were appropriate She was admitted for hospital death given high likelihood of imminent decline   Assessment & Plan:   Principal Problem:   Intraventricular hemorrhage ([place]) Active Problems:   Obtunded   Encephalopathy   1. intraVentricular hemorrhage status post Kcentra in the ED a. Hopeful that she has a comfortable and uneventful hospital death however if she survives past the next 24 hours family tells me that they are not able to take care of her at home for end-of-life care so she will need a freestanding hospice b. Continue hospice other measures at this time--continue morphine every 12 as needed, metoprolol every 6 hourly for heart rate, Haldol sublingually every 4 as needed with injection if needed and glycopyrrolate c. Probably we will asked TOC to see her for freestanding hospice placement in the next 24 hours if she continues to maintain blood pressure and other vital parameters and does not appear to be actively dying  DVT prophylaxis: No anticoagulation Code Status: DNR/DNI Family Communication: Long discussion multiple family members Disposition:  Status is: Observation  The patient remains OBS appropriate and will d/c before 2 midnights.  Dispo: The patient is from: Home              Anticipated d/c is to: unclear              Anticipated d/c date is: 2 days              Patient currently is not medically stable to d/c.       Consultants:   pallaitive  Procedures: n  Antimicrobials: n     Subjective: Minimally responsive but moves UE  Objective: Vitals:   02/29/2020 2030 03/17/2020 2130 03/11/2020 2331 03/08/20 0143  BP:   (!) 149/106 126/71  Pulse: (!) 113 69 97 (!) 122  Resp: (!) 6 (!) 7 (!) 22 (!) 23  Temp:   97.8 F (36.6 C) 99.5 F (37.5 C)  TempSrc:   Oral Oral  SpO2: 99% 97% 97% 97%  Weight:        Intake/Output Summary (Last 24 hours) at 03/08/2020 0902 Last data filed at 03/08/2020 0122 Gross per 24 hour  Intake 14.37 ml  Output 850 ml  Net -835.63 ml   Filed Weights   03/05/2020 1300  Weight: 60 kg    Examination:  Lethargic not coherent pupils dilated Chest clear S1-S2 Extremities soft nontender Abdomen soft  Data Reviewed: I have personally reviewed following labs and imaging studies   Radiology Studies: CT Head Wo Contrast  Result Date: 03/01/2020 CLINICAL DATA:  Mental status change, unknown cause. EXAM: CT HEAD WITHOUT CONTRAST TECHNIQUE: Contiguous axial images were obtained from the base of the skull through the vertex without intravenous contrast. COMPARISON:  11/05/2017 and prior. FINDINGS: Brain: Intraparenchymal hemorrhage measuring 2.7 x 2.5 x 2.7 cm centered within the right periatrial white matter. Intraventricular extension of hemorrhage involving the right greater than left lateral, third and fourth ventricles. No midline shift. Diffuse parenchymal volume loss with ex vacuo dilatation. Background chronic microvascular ischemic changes. Sequela of remote right frontoparietal insults. Vascular:  No hyperdense vessel or unexpected calcification. Bilateral skull base atherosclerotic calcifications. Skull: Negative for fracture or focal lesion. Sinuses/Orbits: No acute orbital finding. Pneumatized paranasal sinuses and mastoid air cells. Other: None. IMPRESSION: Right occipital intraparenchymal hemorrhage with intraventricular extension. Remote right frontoparietal insults. Cerebral atrophy and chronic microvascular ischemic changes. These  results were called by telephone at the time of interpretation on 03/16/2020 at 1:11 pm to provider ADAM CURATOLO , who verbally acknowledged these results. Electronically Signed   By: Primitivo Gauze M.D.   On: 03/20/2020 13:21   DG Chest Portable 1 View  Result Date: 03/03/2020 CLINICAL DATA:  Altered mental status. EXAM: PORTABLE CHEST 1 VIEW COMPARISON:  Chest x-ray dated December 03, 2017. FINDINGS: The patient is rotated to the left. Unchanged mild cardiomegaly. 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 Normal pulmonary vascularity. No focal consolidation, pleural effusion, or pneumothorax. No acute osseous abnormality. IMPRESSION: No active disease. Electronically Signed   By: Titus Dubin M.D.   On: 02/20/2020 13:26     Scheduled Meds: . antiseptic oral rinse  15 mL Topical BID  . metoprolol tartrate  5 mg Intravenous Q6H   Continuous Infusions:   LOS: 0 days    Time spent: [place], MD [facility] To contact the attending provider between 7A-7P or the covering provider during after hours 7P-7A, please log into the web site [email or web address] and access using universal [place] password for that web site. If you do not have the password, please call the hospital operator.  03/08/2020, 9:02 AM

## 2020-03-08 NOTE — Progress Notes (Addendum)
Daily Progress Note   Patient Name: Kayla Wilson       Date: 03/08/2020 DOB: 19-Feb-1941  Age: 79 y.o. MRN#: [id number] Attending Physician: Nita Sells, MD Primary Care Physician: Dorothyann Peng, NP Admit Date: 03/19/2020  Reason for Consultation/Follow-up: Disposition, Establishing goals of care, Non pain symptom management, Pain control, Psychosocial/spiritual support and Terminal Care  Subjective: Chart review performed. Received report from primary RN - no acute concerns.   Went to visit patient at bedside - many family members were present. Patient was lying in bed - she did not wake to voice or gently touch, she was not able to participate in conversation. No signs or non-verbal gestures of pain or discomfort noted. No respiratory distress, increased work of breathing, or secretions noted.   Emotional support provided to family. Family explained patient is overall comfortable, and when they notice discomfort the morphine helps relieve symptoms. Education provided on other PRN medications available if needed - family expressed understanding.   Discussed transfer to residential hospice. Explained if their goal was for transfer, the patient seems stable and within a window of opportunity we could take advantage of; however, they understand that can change at any time and transfer becomes more risky. Discussed options of staying in house for EOL care vs residential hospice. Family expresses desire for transfer and would like to discuss with each other which facility they prefer - explained I would give them time to discuss and TOC would reach out to get their decision today. Informed family that pending bed availability at the facility they choose, will depend on when we can transport -  discussed that we would provided ongoing assessments to ensure patient still seems stable for transport when bed is available.   Discussed with family discontinuing metoprolol as I expect her HR and BP to naturally decline as patient approaches EOL - family expressed understanding and were agreeable.  Emotional support and therapeutic listening provided throughout visit.  All questions and concerns addressed. Encouraged to call with questions and/or concerns. PMT card provided.   Length of Stay: 0  Current Medications: Scheduled Meds:  . antiseptic oral rinse  15 mL Topical BID  . metoprolol tartrate  5 mg Intravenous Q6H    Continuous Infusions:   PRN Meds: acetaminophen **OR** acetaminophen, glycopyrrolate, haloperidol **OR** haloperidol **OR** haloperidol lactate,  LORazepam, morphine injection, ondansetron **OR** ondansetron (ZOFRAN) IV, polyvinyl alcohol  Physical Exam Vitals and nursing note reviewed.  Constitutional:      General: She is not in acute distress.    Appearance: She is ill-appearing.     Comments: Frail appearing  Pulmonary:     Effort: No respiratory distress.  Skin:    General: Skin is warm and dry.  Neurological:     Mental Status: She is unresponsive.     Motor: Weakness present.  Psychiatric:        Speech: She is noncommunicative.             Vital Signs: BP 126/71 (BP Location: Left Arm)   Pulse (!) 122   Temp 99.5 F (37.5 C) (Oral)   Resp (!) 23   Wt 60 kg   SpO2 97%   BMI 20.11 kg/m  SpO2: SpO2: 97 % O2 Device: O2 Device: Room Air O2 Flow Rate:    Intake/output summary:   Intake/Output Summary (Last 24 hours) at 03/08/2020 1229 Last data filed at 03/08/2020 0539 Gross per 24 hour  Intake 14.37 ml  Output 1000 ml  Net -985.63 ml   LBM:   Baseline Weight: Weight: 60 kg Most recent weight: Weight: 60 kg       Palliative Assessment/Data: PPS 10%    Flowsheet Rows   Flowsheet Row Most Recent Value  Intake Tab   Unit at  Time of Referral ER  Palliative Care Primary Diagnosis Trauma  Date Notified 02/29/2020  Palliative Care Type New Palliative care  Reason for referral Clarify Goals of Care  Date of Admission 02/29/2020  Date first seen by Palliative Care 03/14/2020  # of days Palliative referral response time 0 Day(s)  # of days IP prior to Palliative referral 0  Clinical Assessment   Psychosocial & Spiritual Assessment   Palliative Care Outcomes   Patient/Family meeting held? Yes  Who was at the meeting? 75 family members - children, grandchildren  Palliative Care Outcomes Improved pain interventions, Improved non-pain symptom therapy, Clarified goals of care, Counseled regarding hospice, Provided end of life care assistance, Provided psychosocial or spiritual support, Changed to focus on comfort  Patient/Family wishes: Interventions discontinued/not started  Mechanical Ventilation, Antibiotics, BiPAP, Tube feedings/TPN, Hemodialysis, NIPPV, Transfusion, Trach, Vasopressors, PEG, Transfer out of ICU      Patient Active Problem List   Diagnosis Date Noted  . Intraventricular hemorrhage ([place]) 03/02/2020  . Obtunded 03/20/2020  . Encephalopathy 02/28/2020  . Avulsion, finger tip, subsequent encounter 12/03/2017  . S/P aneurysm repair 12/03/2017  . Hyperlipidemia 11/07/2017  . Hx of hemorrhagic stroke 11/07/2017  . UTI (urinary tract infection) 11/07/2017  . Hypokalemia 11/07/2017  . Thyroid nodule 11/07/2017  . Lung density on x-ray 11/07/2017  . 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 CVA (cerebral vascular accident) ([place]) - small embolic infarcts d/t AF 76/73/4193  . 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 DJD (degenerative joint disease) 09/03/2014  . Angioedema   . Urticaria   . Allergic reaction to serum, anaphylactic shock   . Chronic systolic dysfunction of left ventricle 11/14/2013  . Atrial fibrillation ([place]) 09/26/2013  . UNSPECIFIED VENOUS INSUFFICIENCY 11/21/2008  . PSORIASIS  02/16/2008  . 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 DIVERTICULOSIS, COLON 01/12/2007  . COLONIC POLYPS, HX OF 01/12/2007  . Essential hypertension 11/15/2006    Palliative Care Assessment & Plan   Patient Profile: 79 y.o. female  with past medical history of  stroke, HTN, atrial fibrillation, CHF who initially presented to the ED on 03/12/2020 for nausea and vomiting became obtunded while in the waiting room. CT revealed patient had large right occipital parenchymal hemorrhage with intraventricular hemorrhage.   ED Course:In ED vital signs significant for atrial fibrillation, respiratory rate in the 20s, blood pressure in the 841L to 244W systolic. Lab work-up showed chronic hyponatremia at 129, chronic hypokalemia at 3, glucose 139, normal LFT, normal CBC, normal lipase respiratory for flu and Covid pending.Neurosurgery consulted in ED who state there is not likely any benefit to neurosurgical intervention and that comfort measures would be appropriate.   Assessment: Acute encephalopathy Intraventricular hemorrhage Obtunded Atrial fibrillation Hypertension Terminal care  Recommendations/Plan: Continue full comfort measures Continue DNR/DNI as previously documented Family are interested today in transfer to residential hospice while patient is stable - TOC notified Provide ongoing assessments to ensure patient is stable for transfer when bed becomes available at hospice facility Continue current EOL medication regimen, patient appears comfortable Discontinued metoprolol Provide frequent assessments and administer PRN medications as clinically necessary to ensure EOL comfort PMT will continue to follow holistically  **ADDENDUM** Notified by RN that family is requesting a morphine drip be started. RN reports patient is comfortable with PRN medications with no significant changes from this morning - RN provided education to family that patient is not showing  signs of needing a drip at this time, but family is still requesting. Noted per PRN administration, patient is requiring 2mg  morphine about every 4 hours - will add 0.5mg /hr morphine drip with 1-2mg  q15 min PRN bolus doses.  Goals of Care and Additional Recommendations: Limitations on Scope of Treatment: Full Comfort Care  Code Status:    Code Status Orders  (From admission, onward)         Start     Ordered   03/21/2020 1832  Do not attempt resuscitation (DNR)  Continuous       Question Answer Comment  In the event of cardiac or respiratory ARREST Do not call a "code blue"   In the event of cardiac or respiratory ARREST Do not perform Intubation, CPR, defibrillation or ACLS   In the event of cardiac or respiratory ARREST Use medication by any route, position, wound care, and other measures to relive pain and suffering. May use oxygen, suction and manual treatment of airway obstruction as needed for comfort.      03/03/2020 1848        Code Status History    Date Active Date Inactive Code Status Order ID Comments User Context   12/03/2017 1858 12/05/2017 1438 Full Code [id number]  Rosetta Posner, MD Inpatient   11/05/2017 0401 11/07/2017 1931 Full Code [id number]  Aroor, Lanice Schwab, MD Inpatient   11/04/2017 1616 11/05/2017 0400 Full Code [id number]  Cristy Folks, MD Inpatient   09/03/2014 1845 09/05/2014 1524 Full Code [id number]  Lovett Calender, PA-C Inpatient   09/03/2014 1734 09/03/2014 1845 Full Code [id number]  Rush Farmer, MD Inpatient   Advance Care Planning Activity      Prognosis:  Hours - Days  Discharge Planning: Hospice facility  Care plan was discussed with primary RN, patient's family, Dr. Alinda Dooms, [facility]  Thank you for allowing the Palliative Medicine Team to assist in the care of this patient.   Total Time 25 minutes Prolonged Time Billed  no       Greater than 50%  of this time was spent counseling and coordinating care related to the above assessment  and  plan.  Lin Landsman, NP  Please contact Palliative Medicine Team phone at [phone] for questions and concerns.

## 2020-03-08 NOTE — TOC Initial Note (Signed)
Transition of Care [facility]) - Initial/Assessment Note    Patient Details  Name: Kayla Wilson MRN: [id number] Date of Birth: [birth year]  Transition of [facility]) CM/SW Contact:    Bartholomew Crews, RN Phone Number: [phone] 03/08/2020, 3:23 PM  Clinical Narrative:                  Notified by palliative NP of need for family to be offered choice of residential hospice facilities. Spoke with family at the bedside. Whitney identified as [employer]. Discussed choice of hospice residential facilities. Referral placed to [facility]. Whitney's contact information provided. No hospice beds available today. TOC following for transition needs.   Expected Discharge Plan: Hospice Medical Facility Barriers to Discharge: Hospice Bed not available   Patient Goals and CMS Choice   CMS Medicare.gov Compare Post Acute Care list provided to:: Patient Represenative (must comment) Vernie Shanks) Choice offered to / list presented to : Adult Children  Expected Discharge Plan and Services Expected Discharge Plan: [place] In-house Referral: Hospice / Palliative Care Discharge Planning Services: CM Consult Post Acute Care Choice: Hospice Living arrangements for the past 2 months: Single Family Home                 DME Arranged: N/A DME Agency: NA       HH Arranged: NA HH Agency: NA        Prior Living Arrangements/Services Living arrangements for the past 2 months: Single Family Home Lives with:: Self                Criminal Activity/Legal Involvement Pertinent to Current Situation/Hospitalization: No - Comment as needed  Activities of Daily Living      Permission Sought/Granted                  Emotional Assessment         Alcohol / Substance Use: Not Applicable Psych Involvement: No (comment)  Admission diagnosis:  Intracranial hemorrhage ([facility]) [I62.9] Intraventricular hemorrhage ([place]) [I61.5] Patient Active Problem  List   Diagnosis Date Noted  . Intraventricular hemorrhage ([place]) 03/13/2020  . Obtunded 03/06/2020  . Encephalopathy 03/21/2020  . Avulsion, finger tip, subsequent encounter 12/03/2017  . S/P aneurysm repair 12/03/2017  . Hyperlipidemia 11/07/2017  . Hx of hemorrhagic stroke 11/07/2017  . UTI (urinary tract infection) 11/07/2017  . Hypokalemia 11/07/2017  . Thyroid nodule 11/07/2017  . Lung density on x-ray 11/07/2017  . 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 CVA (cerebral vascular accident) ([place]) - small embolic infarcts d/t AF 78/67/6720  . 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 DJD (degenerative joint disease) 09/03/2014  . Angioedema   . Urticaria   . Allergic reaction to serum, anaphylactic shock   . Chronic systolic dysfunction of left ventricle 11/14/2013  . Atrial fibrillation ([place]) 09/26/2013  . UNSPECIFIED VENOUS INSUFFICIENCY 11/21/2008  . PSORIASIS 02/16/2008  . 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 DIVERTICULOSIS, COLON 01/12/2007  . COLONIC POLYPS, HX OF 01/12/2007  . Essential hypertension 11/15/2006   PCP:  Dorothyann Peng, NP Pharmacy:   [facility] [address] [place] [address] [place] [address] Phone: [phone] Fax: [phone]     Social Determinants of Health (SDOH) Interventions    Readmission Risk Interventions No flowsheet data found.

## 2020-03-09 NOTE — Progress Notes (Signed)
Pt been grimacing and kicking leg up and down. Morphine bolus has been given as ordered. Family at bedside worried. Made NP on call aware. Awaiting response.

## 2020-03-09 NOTE — Progress Notes (Signed)
  PROGRESS NOTE   Kayla Wilson  TUU:[id number] DOB: 03-Nov-1940 DOA: 03/20/2020 PCP: Dorothyann Peng, NP  Brief Narrative:  79 year old white female known history of angioedema A. fib heart failure diverticulosis multiple others admitted with right leg pain nausea vomiting and became an obtunded Found to have large right occipital parenchymal hemorrhage with intraventricular hemorrhage Dr. Ellene Route five neurosurgery can consulted by ED and felt conservative measures were appropriate She was admitted for hospital death given high likelihood of imminent decline   Assessment & Plan:   Principal Problem:   Intraventricular hemorrhage ([place]) Active Problems:   Obtunded   Encephalopathy   1. intraVentricular hemorrhage status post Kcentra in the ED a. Patient has declined further and is less responsive b. Continue hospice other measures at this time--continue morphine every 12 as needed, metoprolol every 6 hourly for heart rate, Haldol sublingually every 4 as needed with injection if needed and glycopyrrolate c. Expect hospital death  DVT prophylaxis: No anticoagulation Code Status: DNR/DNI Family Communication: Discussed with daughter at bedside Disposition:  Status is: Observation  The patient remains OBS appropriate and will d/c before 2 midnights.  Dispo: The patient is from: Home              Anticipated d/c is to: unclear              Anticipated d/c date is: 2 days              Patient currently is not medically stable to d/c.  Consultants:   pallaitive  Procedures: n  Antimicrobials: n    Subjective: Somewhat obtunded not really responsive  Objective: Vitals:   03/12/2020 2130 03/05/2020 2331 03/08/20 0143 03/09/20 0728  BP:  (!) 149/106 126/71 (!) 162/89  Pulse: 69 97 (!) 122 100  Resp: (!) 7 (!) 22 (!) 23 20  Temp:  97.8 F (36.6 C) 99.5 F (37.5 C) 98.2 F (36.8 C)  TempSrc:  Oral Oral Axillary  SpO2: 97% 97% 97% 100%  Weight:        Intake/Output Summary  (Last 24 hours) at 03/09/2020 1536 Last data filed at 03/09/2020 1300 Gross per 24 hour  Intake 0.68 ml  Output 450 ml  Net -449.32 ml   Filed Weights   03/06/2020 1300  Weight: 60 kg    Examination:  Awake coherent pleasant EOMI NCAT S1-S2 no murmur Chest clear no added sound Abdomen soft  Data Reviewed: I have personally reviewed following labs and imaging studies   Radiology Studies: No results found.   Scheduled Meds: . antiseptic oral rinse  15 mL Topical BID   Continuous Infusions: . morphine 0.5 mg/hr (03/09/20 1526)     LOS: 1 day    Time spent: [place], MD [facility] To contact the attending provider between 7A-7P or the covering provider during after hours 7P-7A, please log into the web site [email or web address] and access using universal [place] password for that web site. If you do not have the password, please call the hospital operator.  03/09/2020, 3:36 PM

## 2020-03-09 NOTE — Progress Notes (Signed)
Daily Progress Note   Patient Name: Kayla Wilson       Date: 03/09/2020 DOB: 07/11/1940  Age: 79 y.o. MRN#: [id number] Attending Physician: Nita Sells, MD Primary Care Physician: Dorothyann Peng, NP Admit Date: 03/06/2020  Reason for Consultation/Follow-up: Disposition, Establishing goals of care, Non pain symptom management, Pain control, Psychosocial/spiritual support and Terminal Care  Subjective: Chart review performed. Received report from primary RN - no acute concerns.   Went to visit patient at bedside - many family/visitors present. Patient was lying in bed - she did not wake to voice or gentle touch and is not able to participate in conversation. No signs or non-verbal gestures of pain or discomfort noted. No respiratory distress, increased work of breathing, or secretions noted. Patient appears comfortable.  Emotional support and therapeutic listening provided to family as they expressed their thoughts and feelings about patient's current medical situation. Family explained they no longer wish for patient to discharge to residential hospice facility. After speaking with facility liaison and getting their questions answered, they have changed their mind and now wish for patient to remain in house for EOL. Family feels the patient is comfortable at this time - they explained patient had a restful and peaceful night.   All questions and concerns addressed. Encouraged to call with questions and/or concerns. PMT card previously provided.  Length of Stay: 1  Current Medications: Scheduled Meds:  . antiseptic oral rinse  15 mL Topical BID    Continuous Infusions: . morphine 0.5 mg/hr (03/08/20 1638)    PRN Meds: acetaminophen **OR** acetaminophen, diphenhydrAMINE,  glycopyrrolate, haloperidol **OR** haloperidol **OR** haloperidol lactate, LORazepam, morphine, ondansetron **OR** ondansetron (ZOFRAN) IV, polyvinyl alcohol  Physical Exam Vitals and nursing note reviewed.  Constitutional:      General: She is not in acute distress.    Appearance: She is ill-appearing.     Comments: Frail appearing  Pulmonary:     Effort: No respiratory distress.  Skin:    General: Skin is warm and dry.  Neurological:     Mental Status: She is unresponsive.     Motor: Weakness present.  Psychiatric:        Speech: She is noncommunicative.             Vital Signs: BP (!) 162/89 (BP Location: Left Arm)   Pulse  100   Temp 98.2 F (36.8 C) (Axillary)   Resp 20   Wt 60 kg   SpO2 100%   BMI 20.11 kg/m  SpO2: SpO2: 100 % O2 Device: O2 Device: Room Air O2 Flow Rate:    Intake/output summary:   Intake/Output Summary (Last 24 hours) at 03/09/2020 1121 Last data filed at 03/09/2020 0600 Gross per 24 hour  Intake 0.68 ml  Output 300 ml  Net -299.32 ml   LBM:   Baseline Weight: Weight: 60 kg Most recent weight: Weight: 60 kg       Palliative Assessment/Data: PPS 10%    Flowsheet Rows   Flowsheet Row Most Recent Value  Intake Tab   Unit at Time of Referral ER  Palliative Care Primary Diagnosis Trauma  Date Notified 03/03/2020  Palliative Care Type New Palliative care  Reason for referral Clarify Goals of Care  Date of Admission 03/21/2020  Date first seen by Palliative Care 03/09/2020  # of days Palliative referral response time 0 Day(s)  # of days IP prior to Palliative referral 0  Clinical Assessment   Psychosocial & Spiritual Assessment   Palliative Care Outcomes   Patient/Family meeting held? Yes  Who was at the meeting? 20 family members - children, grandchildren  Palliative Care Outcomes Improved pain interventions, Improved non-pain symptom therapy, Clarified goals of care, Counseled regarding hospice, Provided end of life care assistance,  Provided psychosocial or spiritual support, Changed to focus on comfort  Patient/Family wishes: Interventions discontinued/not started  Mechanical Ventilation, Antibiotics, BiPAP, Tube feedings/TPN, Hemodialysis, NIPPV, Transfusion, Trach, Vasopressors, PEG, Transfer out of ICU      Patient Active Problem List   Diagnosis Date Noted  . 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 Obtunded 03/17/2020  . Encephalopathy 03/20/2020  . Avulsion, finger tip, subsequent encounter 12/03/2017  . S/P aneurysm repair 12/03/2017  . Hyperlipidemia 11/07/2017  . Hx of hemorrhagic stroke 11/07/2017  . UTI (urinary tract infection) 11/07/2017  . Hypokalemia 11/07/2017  . Thyroid nodule 11/07/2017  . Lung density on x-ray 11/07/2017  . 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 CVA (cerebral vascular accident) ([place]) - small embolic infarcts d/t AF 16/12/9602  . 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 DJD (degenerative joint disease) 09/03/2014  . Angioedema   . Urticaria   . Allergic reaction to serum, anaphylactic shock   . Chronic systolic dysfunction of left ventricle 11/14/2013  . Atrial fibrillation ([place]) 09/26/2013  . UNSPECIFIED VENOUS INSUFFICIENCY 11/21/2008  . PSORIASIS 02/16/2008  . 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 DIVERTICULOSIS, COLON 01/12/2007  . COLONIC POLYPS, HX OF 01/12/2007  . Essential hypertension 11/15/2006    Palliative Care Assessment & Plan   Patient Profile: 79 y.o.femalewith past medical history of stroke, HTN, atrial fibrillation, CHF who initially presented to the ED on 03/02/2020 for nausea and vomiting became obtunded while in the waiting room. CT revealed patient had large right occipital parenchymal hemorrhage with intraventricular hemorrhage.  ED Course:In ED vital signs significant for atrial fibrillation, respiratory rate in the 20s, blood pressure in the 540J to 811B systolic. Lab work-up showed chronic hyponatremia at  129, chronic hypokalemia at 3, glucose 139, normal LFT, normal CBC, normal lipase respiratory for flu and Covid pending.Neurosurgery consulted in ED who state there is not likely any benefit to neurosurgical intervention and that comfort measures would be appropriate.  Assessment: Acute encephalopathy Intraventricular hemorrhage Obtunded Atrial fibrillation Hypertension Terminal care  Recommendations/Plan: Continue full comfort measures - patient has  likely hours to days Continue DNR/DNI as previously documented Family is no longer wanting transfer to residential hospice facility; patient will be hospital death - TOC notified Continue current EOL medication regimen, patient appears comfortable Provide frequent assessments and administer PRN medications as clinically necessary to ensure EOL comfort PMT will continue to follow holistically   Goals of Care and Additional Recommendations: Limitations on Scope of Treatment: Full Comfort Care  Code Status:    Code Status Orders  (From admission, onward)         Start     Ordered   03/06/2020 1832  Do not attempt resuscitation (DNR)  Continuous       Question Answer Comment  In the event of cardiac or respiratory ARREST Do not call a "code blue"   In the event of cardiac or respiratory ARREST Do not perform Intubation, CPR, defibrillation or ACLS   In the event of cardiac or respiratory ARREST Use medication by any route, position, wound care, and other measures to relive pain and suffering. May use oxygen, suction and manual treatment of airway obstruction as needed for comfort.      03/06/2020 1848        Code Status History    Date Active Date Inactive Code Status Order ID Comments User Context   12/03/2017 1858 12/05/2017 1438 Full Code [id number]  Rosetta Posner, MD Inpatient   11/05/2017 0401 11/07/2017 1931 Full Code [id number]  Aroor, Lanice Schwab, MD Inpatient   11/04/2017 1616 11/05/2017 0400 Full Code [id number]  Cristy Folks,  MD Inpatient   09/03/2014 1845 09/05/2014 1524 Full Code [id number]  Lovett Calender, PA-C Inpatient   09/03/2014 1734 09/03/2014 1845 Full Code [id number]  Rush Farmer, MD Inpatient   Advance Care Planning Activity      Prognosis:  Hours - Days  Discharge Planning: Anticipated Hospital Death  Care plan was discussed with primary RN, patient's family, Dr. Verlon Au, [facility]  Thank you for allowing the Palliative Medicine Team to assist in the care of this patient.   Total Time 25 minutes Prolonged Time Billed  no       Greater than 50%  of this time was spent counseling and coordinating care related to the above assessment and plan.  Lin Landsman, NP  Please contact Palliative Medicine Team phone at [phone] for questions and concerns.

## 2020-03-09 NOTE — Progress Notes (Signed)
Morphine basal rate increased to 1mg /hr per order. Will continue to monitor patient.

## 2020-03-10 MED ORDER — HYDROMORPHONE BOLUS VIA INFUSION
1.0000 mg | INTRAVENOUS | Status: DC | PRN
  Administered 2020-03-10: 1 mg via INTRAVENOUS
  Filled 2020-03-10: qty 1

## 2020-03-10 MED ORDER — GLYCOPYRROLATE 0.2 MG/ML IJ SOLN: 0.4000 mg | Freq: Four times a day (QID) | INTRAMUSCULAR | Status: DC | PRN

## 2020-03-10 MED ORDER — MORPHINE BOLUS VIA INFUSION
4.0000 mg | INTRAVENOUS | Status: AC | PRN
  Administered 2020-03-10 (×2): 4 mg via INTRAVENOUS
  Filled 2020-03-10: qty 4

## 2020-03-10 MED ORDER — GLYCOPYRROLATE 0.2 MG/ML IJ SOLN: 0.4000 mg | Freq: Four times a day (QID) | INTRAMUSCULAR | Status: DC

## 2020-03-10 MED ORDER — SODIUM CHLORIDE 0.9 % IV SOLN
1.0000 mg/h | INTRAVENOUS | Status: DC
  Administered 2020-03-10 (×2): 1 mg/h via INTRAVENOUS
  Filled 2020-03-10: qty 2.5

## 2020-03-20 ENCOUNTER — Ambulatory Visit: Payer: Medicare Other | Admitting: Adult Health

## 2020-03-22 NOTE — Progress Notes (Signed)
Daily Progress Note   Patient Name: Kayla Wilson       Date: 2020-03-25 DOB: 1940-09-14  Age: 80 y.o. MRN#: [id number] Attending Physician: Nita Sells, MD Primary Care Physician: Dorothyann Peng, NP Admit Date: 03/04/2020  Reason for Consultation/Follow-up: Non pain symptom management, Pain control, Psychosocial/spiritual support and Terminal Care  Subjective: Received notification from PMT RN that patient's symptoms were not being managed with morphine, despite utilizing PRN doses. Discussed medication changes, verbal orders given at that time.  Chart review performed. Received report from primary RN - no acute concerns after medication changes. Oxygen was not initiated as patient is approaching end of life.  Went to patient's bedside - one family member present in room, patient was getting a bath. No signs or non-verbal gestures of pain or discomfort noted. No respiratory distress or increased work of breathing; secretions were noted. Therapeutic listening and emotional support provided as family member reflected on events overnight and this morning. She described what they thought was a seizure on the patient's left side that improved with ativan. Noted in chart patient required many bolus doses overnight. Updated family member on the current plan of care for symptom management, which included switching morphine to dilaudid and increasing robinul for secretion management - expressed understanding and were agreeable. Family member requested I meet with the rest of the family in the waiting room to provide updates: Went to waiting room with family member (where many other family members were waiting) to updated entire family on the current plan of care as outlined above. The family were in  agreement not to initiate oxygen with understanding that oxygen can be a life prolonging measure and does not necessarily provide comfort. Family requested vitals be checked three times per day or more. Education provided that vital signs do not provide helpful information at end of life - that we listen more to what the patient's body is telling us rather than numbers - family expressed understanding and were ok with daily and PRN vitals after discussion. Therapeutic listening provided as family expressed their love for the patient and how blessed they were to have a family that was supportive and caring of her.   Family members re-entered patient's room after bath was completed. Several minutes later, family member came out into the hall where primary RN and  I were talking and stated they felt the patient had passed away. Primary RN and myself entered room - listened to apical pulse x1 minute and both verified no heartbeat. Emotional support provided to family during this difficult time.   All questions and concerns addressed. Encouraged to call with questions and/or concerns. PMT card previously provided.  Length of Stay: 2  Current Medications: Scheduled Meds:  . antiseptic oral rinse  15 mL Topical BID  . glycopyrrolate  0.4 mg Intravenous Q6H    Continuous Infusions: . HYDROmorphone    . morphine 2 mg/hr (Apr 02, 2020 0734)    PRN Meds: acetaminophen **OR** acetaminophen, diphenhydrAMINE, glycopyrrolate, haloperidol **OR** haloperidol **OR** haloperidol lactate, HYDROmorphone, LORazepam, morphine, ondansetron **OR** ondansetron (ZOFRAN) IV, polyvinyl alcohol  Physical Exam Vitals and nursing note reviewed.  Constitutional:      Appearance: She is cachectic. She is ill-appearing.  Skin:    General: Skin is cool and dry.   Patient passed away during visit          Vital Signs: BP 99/61 (BP Location: Left Arm)   Pulse 62   Temp 98.3 F (36.8 C) (Oral)   Resp 12   Wt 60 kg   SpO2  (!) 78%   BMI 20.11 kg/m  SpO2: SpO2: (!) 78 % O2 Device: O2 Device: Room Air O2 Flow Rate:    Intake/output summary:   Intake/Output Summary (Last 24 hours) at 2020-04-02 1026 Last data filed at 03/09/2020 1300 Gross per 24 hour  Intake 0 ml  Output 150 ml  Net -150 ml   LBM:   Baseline Weight: Weight: 60 kg Most recent weight: Weight: 60 kg       Palliative Assessment/Data: 0%    Flowsheet Rows   Flowsheet Row Most Recent Value  Intake Tab   Unit at Time of Referral ER  Palliative Care Primary Diagnosis Trauma  Date Notified 02/21/2020  Palliative Care Type New Palliative care  Reason for referral Clarify Goals of Care  Date of Admission 02/22/2020  Date first seen by Palliative Care 02/27/2020  # of days Palliative referral response time 0 Day(s)  # of days IP prior to Palliative referral 0  Clinical Assessment   Psychosocial & Spiritual Assessment   Palliative Care Outcomes   Patient/Family meeting held? Yes  Who was at the meeting? 19 family members - children, grandchildren  Palliative Care Outcomes Improved pain interventions, Improved non-pain symptom therapy, Clarified goals of care, Counseled regarding hospice, Provided end of life care assistance, Provided psychosocial or spiritual support, Changed to focus on comfort  Patient/Family wishes: Interventions discontinued/not started  Mechanical Ventilation, Antibiotics, BiPAP, Tube feedings/TPN, Hemodialysis, NIPPV, Transfusion, Trach, Vasopressors, PEG, Transfer out of ICU      Patient Active Problem List   Diagnosis Date Noted  . 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 Obtunded 03/06/2020  . Encephalopathy 02/24/2020  . Avulsion, finger tip, subsequent encounter 12/03/2017  . S/P aneurysm repair 12/03/2017  . Hyperlipidemia 11/07/2017  . Hx of hemorrhagic stroke 11/07/2017  . UTI (urinary tract infection) 11/07/2017  . Hypokalemia 11/07/2017  . Thyroid nodule 11/07/2017  . Lung density on x-ray  11/07/2017  . 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 CVA (cerebral vascular accident) ([place]) - small embolic infarcts d/t AF 23/53/6144  . 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 DJD (degenerative joint disease) 09/03/2014  . Angioedema   . Urticaria   . Allergic reaction to serum, anaphylactic shock   . Chronic systolic dysfunction of left ventricle  11/14/2013  . Atrial fibrillation ([place]) 09/26/2013  . UNSPECIFIED VENOUS INSUFFICIENCY 11/21/2008  . PSORIASIS 02/16/2008  . 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 DIVERTICULOSIS, COLON 01/12/2007  . COLONIC POLYPS, HX OF 01/12/2007  . Essential hypertension 11/15/2006    Palliative Care Assessment & Plan   Patient Profile: 80 y.o.femalewith past medical history of stroke, HTN, atrial fibrillation, CHF who initially presented to the ED on 03/20/2020 for nausea and vomiting became obtunded while in the waiting room. CT revealed patient had large right occipital parenchymal hemorrhage with intraventricular hemorrhage.  ED Course:In ED vital signs significant for atrial fibrillation, respiratory rate in the 20s, blood pressure in the 615P to 794F systolic. Lab work-up showed chronic hyponatremia at 129, chronic hypokalemia at 3, glucose 139, normal LFT, normal CBC, normal lipase respiratory for flu and Covid pending.Neurosurgery consulted in ED who state there is not likely any benefit to neurosurgical intervention and that comfort measures would be appropriate.  Assessment: Acute encephalopathy Intraventricular hemorrhage Obtunded Atrial fibrillation Hypertension Terminal care  Recommendations/Plan: Patient peacefully passed away with family at her bedside Provide emotional support to family during this difficult time  Goals of Care and Additional Recommendations: Limitations on Scope of Treatment: Full Comfort Care  Code Status:    Code Status Orders  (From admission, onward)         Start      Ordered   03/05/2020 1832  Do not attempt resuscitation (DNR)  Continuous       Question Answer Comment  In the event of cardiac or respiratory ARREST Do not call a "code blue"   In the event of cardiac or respiratory ARREST Do not perform Intubation, CPR, defibrillation or ACLS   In the event of cardiac or respiratory ARREST Use medication by any route, position, wound care, and other measures to relive pain and suffering. May use oxygen, suction and manual treatment of airway obstruction as needed for comfort.      02/21/2020 1848        Code Status History    Date Active Date Inactive Code Status Order ID Comments User Context   12/03/2017 1858 12/05/2017 1438 Full Code [id number]  Rosetta Posner, MD Inpatient   11/05/2017 0401 11/07/2017 1931 Full Code [id number]  Aroor, Lanice Schwab, MD Inpatient   11/04/2017 1616 11/05/2017 0400 Full Code [id number]  Cristy Folks, MD Inpatient   09/03/2014 1845 09/05/2014 1524 Full Code [id number]  Lovett Calender, PA-C Inpatient   09/03/2014 1734 09/03/2014 1845 Full Code [id number]  Rush Farmer, MD Inpatient   Advance Care Planning Activity      Prognosis:  Patient passed away  Discharge Planning: Hospital death  Care plan was discussed with primary RN, patient's family, PMT RN  Thank you for allowing the Palliative Medicine Team to assist in the care of this patient.   Total Time 65 minutes Prolonged Time Billed  yes      Greater than 50%  of this time was spent counseling and coordinating care related to the above assessment and plan.  Lin Landsman, NP  Please contact Palliative Medicine Team phone at [phone] for questions and concerns.

## 2020-03-22 NOTE — Progress Notes (Signed)
25cc's of morphine wasted from drip. Joaquim Lai, RN present to witness waste.

## 2020-03-22 NOTE — Progress Notes (Signed)
86mL of dilaudid wasted in stericycle.  Paulette, RN present to witness.

## 2020-03-22 NOTE — Progress Notes (Signed)
Palliative Medicine RN Note: Symptom check. Our team rec'd a call this am that pt is symptomatic; morphine was increased.  Kayla Wilson has many family members around the bedside. We discussed medications, indications, usages. I spoke with Dr Hilma Favors, NP Elmer Picker, and RN Amy.   Kayla Skiff Abubakar Crispo, RN, BSN, [facility] Palliative Medicine Team Apr 06, 2020 2:01 PM Office [phone]

## 2020-03-22 NOTE — Death Summary Note (Addendum)
Death Summary  Kayla Wilson VEH:[id number] DOB: 1940-12-02 DOA: March 20, 2020  PCP: Dorothyann Peng, NP  Admit date: 03/20/20 Date of Death: 23-Mar-2020 Time of Death: 11:23 AM Notification: Dorothyann Peng, NP notified of death of 2020-03-23   History of present illness:  80 year old white female known history of angioedema A. fib heart failure diverticulosis multiple others admitted with right leg pain nausea vomiting and became an obtunded Found to have large right occipital parenchymal hemorrhage with intraventricular hemorrhage Dr. Ellene Route NS consulted by ED and felt conservative measures were appropriate She was admitted for hospital death given high likelihood of imminent decline   Final Diagnoses:  1.   Intraventricular hemorrhage with decline-was given Kcentra in ED Hospice measures were 8 enforced including morphine Robinul Ativan and patient was kept on as needed oxygen Patient was found to be without breath no heart rate and pupils were fixed and dilated 11:23 AM on 03-24-2023 We had long discussion with family as this was unexpected death They were thankful to the care provided by palliative care and were grateful for nursing care We will inform primary provider of death   The results of significant diagnostics from this hospitalization (including imaging, microbiology, ancillary and laboratory) are listed below for reference.    Significant Diagnostic Studies: CT Head Wo Contrast  Result Date: 03/20/2020 CLINICAL DATA:  Mental status change, unknown cause. EXAM: CT HEAD WITHOUT CONTRAST TECHNIQUE: Contiguous axial images were obtained from the base of the skull through the vertex without intravenous contrast. COMPARISON:  11/05/2017 and prior. FINDINGS: Brain: Intraparenchymal hemorrhage measuring 2.7 x 2.5 x 2.7 cm centered within the right periatrial white matter. Intraventricular extension of hemorrhage involving the right greater than left lateral, third and fourth  ventricles. No midline shift. Diffuse parenchymal volume loss with ex vacuo dilatation. Background chronic microvascular ischemic changes. Sequela of remote right frontoparietal insults. Vascular: No hyperdense vessel or unexpected calcification. Bilateral skull base atherosclerotic calcifications. Skull: Negative for fracture or focal lesion. Sinuses/Orbits: No acute orbital finding. Pneumatized paranasal sinuses and mastoid air cells. Other: None. IMPRESSION: Right occipital intraparenchymal hemorrhage with intraventricular extension. Remote right frontoparietal insults. Cerebral atrophy and chronic microvascular ischemic changes. These results were called by telephone at the time of interpretation on 20-Mar-2020 at 1:11 pm to provider ADAM CURATOLO , who verbally acknowledged these results. Electronically Signed   By: Primitivo Gauze M.D.   On: 03-20-2020 13:21   DG Chest Portable 1 View  Result Date: March 20, 2020 CLINICAL DATA:  Altered mental status. EXAM: PORTABLE CHEST 1 VIEW COMPARISON:  Chest x-ray dated December 03, 2017. FINDINGS: The patient is rotated to the left. Unchanged mild cardiomegaly. 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 Normal pulmonary vascularity. No focal consolidation, pleural effusion, or pneumothorax. No acute osseous abnormality. IMPRESSION: No active disease. Electronically Signed   By: Titus Dubin M.D.   On: 03-20-2020 13:26    Microbiology: Recent Results (from the past 240 hour(s))  Resp Panel by RT-PCR (Flu A&B, Covid) Nasopharyngeal Swab     Status: None   Collection Time: 03/20/20 12:40 PM   Specimen: Nasopharyngeal Swab; Nasopharyngeal(NP) swabs in vial transport medium  Result Value Ref Range Status   SARS Coronavirus 2 by RT PCR NEGATIVE NEGATIVE Final    Comment: (NOTE) SARS-CoV-2 target nucleic acids are NOT DETECTED.  The SARS-CoV-2 RNA is generally detectable in upper  respiratory specimens during the acute phase of infection. The lowest concentration of SARS-CoV-2 viral copies  this assay can detect is 138 copies/mL. A negative result does not preclude SARS-Cov-2 infection and should not be used as the sole basis for treatment or other patient management decisions. A negative result may occur with  improper specimen collection/handling, submission of specimen other than nasopharyngeal swab, presence of viral mutation(s) within the areas targeted by this assay, and inadequate number of viral copies(<138 copies/mL). A negative result must be combined with clinical observations, patient history, and epidemiological information. The expected result is Negative.  Fact Sheet for Patients:  EntrepreneurPulse.com.au  Fact Sheet for Healthcare Providers:  IncredibleEmployment.be  This test is no t yet approved or cleared by the [place] FDA and  has been authorized for detection and/or diagnosis of SARS-CoV-2 by FDA under an Emergency Use Authorization (EUA). This EUA will remain  in effect (meaning this test can be used) for the duration of the COVID-19 declaration under Section 564(b)(1) of the Act, 21 U.S.C.section 360bbb-3(b)(1), unless the authorization is terminated  or revoked sooner.       Influenza A by PCR NEGATIVE NEGATIVE Final   Influenza B by PCR NEGATIVE NEGATIVE Final    Comment: (NOTE) The Xpert Xpress SARS-CoV-2/FLU/RSV plus assay is intended as an aid in the diagnosis of influenza from Nasopharyngeal swab specimens and should not be used as a sole basis for treatment. Nasal washings and aspirates are unacceptable for Xpert Xpress SARS-CoV-2/FLU/RSV testing.  Fact Sheet for Patients: EntrepreneurPulse.com.au  Fact Sheet for Healthcare Providers: IncredibleEmployment.be  This test is not yet approved or cleared by the [place] FDA and has been  authorized for detection and/or diagnosis of SARS-CoV-2 by FDA under an Emergency Use Authorization (EUA). This EUA will remain in effect (meaning this test can be used) for the duration of the COVID-19 declaration under Section 564(b)(1) of the Act, 21 U.S.C. section 360bbb-3(b)(1), unless the authorization is terminated or revoked.  Performed at [facility], [place] [address] [place], [place] [address]      Labs: Basic Metabolic Panel: Recent Labs  Lab 03/15/2020 1004  NA 129*  K 3.0*  CL 92*  CO2 27  GLUCOSE 135*  BUN 5*  CREATININE 0.71  CALCIUM 9.8   Liver Function Tests: Recent Labs  Lab 02/24/2020 1004  AST 26  ALT 14  ALKPHOS 49  BILITOT 1.1  PROT 8.0  ALBUMIN 3.9   Recent Labs  Lab 02/20/2020 1004  LIPASE 22   No results for input(s): AMMONIA in the last 168 hours. CBC: Recent Labs  Lab 03/04/2020 1004  WBC 6.3  HGB 13.6  HCT 41.1  MCV 87.6  PLT 282   Cardiac Enzymes: No results for input(s): CKTOTAL, CKMB, CKMBINDEX, TROPONINI in the last 168 hours. D-Dimer No results for input(s): DDIMER in the last 72 hours. BNP: Invalid input(s): POCBNP CBG: Recent Labs  Lab 03/01/2020 1245  GLUCAP 103*   Anemia work up No results for input(s): VITAMINB12, FOLATE, FERRITIN, TIBC, IRON, RETICCTPCT in the last 72 hours. Urinalysis    Component Value Date/Time   COLORURINE YELLOW 08/20/2018 1708   APPEARANCEUR HAZY (A) 08/20/2018 1708   LABSPEC 1.016 08/20/2018 1708   PHURINE 7.0 08/20/2018 1708   GLUCOSEU NEGATIVE 08/20/2018 1708   HGBUR MODERATE (A) 08/20/2018 1708   HGBUR trace-intact 11/13/2008 0902   BILIRUBINUR negative 08/28/2018 1425   KETONESUR 5 (A) 08/20/2018 1708   PROTEINUR Negative 08/28/2018 1425   PROTEINUR NEGATIVE 08/20/2018 1708   UROBILINOGEN 0.2 08/28/2018 1425   UROBILINOGEN 0.2 08/23/2014 1150  NITRITE negative 08/28/2018 1425   NITRITE NEGATIVE 08/20/2018 1708   LEUKOCYTESUR Negative 08/28/2018 1425   LEUKOCYTESUR  MODERATE (A) 08/20/2018 1708   Sepsis Labs Invalid input(s): PROCALCITONIN,  WBC,  LACTICIDVEN     SIGNED:  Nita Sells, MD  [facility] 2020/03/24, 1:16 PM

## 2020-03-22 NOTE — Progress Notes (Signed)
Patient reviewed at bedside this morning Patient actively dying-looks much worse off O2 sats 70s Nursing aware to place oxygen Discussed with family typically we do not do vital signs every 2 hours on patients at end-of-life-nursing to reinforce the same Expect hospital death

## 2020-03-22 DEATH — deceased

## 2020-05-28 ENCOUNTER — Ambulatory Visit (HOSPITAL_COMMUNITY): Payer: Medicare Other | Admitting: Nurse Practitioner
